# Patient Record
Sex: Female | Born: 1955 | Race: White | Hispanic: No | Marital: Married | Smoking: Former smoker
Health system: Southern US, Community
[De-identification: ages and names within clinical notes are randomized; demographics above are authoritative.]

## PROBLEM LIST (undated history)

## (undated) DIAGNOSIS — C801 Malignant (primary) neoplasm, unspecified: Secondary | ICD-10-CM

## (undated) DIAGNOSIS — M1990 Unspecified osteoarthritis, unspecified site: Secondary | ICD-10-CM

## (undated) DIAGNOSIS — E611 Iron deficiency: Secondary | ICD-10-CM

## (undated) DIAGNOSIS — Z87442 Personal history of urinary calculi: Secondary | ICD-10-CM

## (undated) DIAGNOSIS — F419 Anxiety disorder, unspecified: Secondary | ICD-10-CM

## (undated) DIAGNOSIS — I10 Essential (primary) hypertension: Secondary | ICD-10-CM

## (undated) DIAGNOSIS — K8050 Calculus of bile duct without cholangitis or cholecystitis without obstruction: Secondary | ICD-10-CM

## (undated) DIAGNOSIS — E0500 Thyrotoxicosis with diffuse goiter without thyrotoxic crisis or storm: Secondary | ICD-10-CM

## (undated) DIAGNOSIS — N200 Calculus of kidney: Secondary | ICD-10-CM

## (undated) DIAGNOSIS — D649 Anemia, unspecified: Secondary | ICD-10-CM

## (undated) DIAGNOSIS — E039 Hypothyroidism, unspecified: Secondary | ICD-10-CM

## (undated) DIAGNOSIS — I890 Lymphedema, not elsewhere classified: Secondary | ICD-10-CM

## (undated) HISTORY — DX: Hypothyroidism, unspecified: E03.9

## (undated) HISTORY — PX: COLONOSCOPY: SHX174

## (undated) HISTORY — PX: APPENDECTOMY: SHX54

## (undated) HISTORY — PX: CHOLECYSTECTOMY: SHX55

## (undated) HISTORY — DX: Anxiety disorder, unspecified: F41.9

## (undated) HISTORY — DX: Lymphedema, not elsewhere classified: I89.0

## (undated) HISTORY — DX: Iron deficiency: E61.1

## (undated) HISTORY — PX: BACK SURGERY: SHX140

## (undated) HISTORY — DX: Calculus of kidney: N20.0

## (undated) HISTORY — DX: Calculus of bile duct without cholangitis or cholecystitis without obstruction: K80.50

## (undated) HISTORY — DX: Thyrotoxicosis with diffuse goiter without thyrotoxic crisis or storm: E05.00

---

## 1999-01-14 ENCOUNTER — Other Ambulatory Visit: Admission: RE | Admit: 1999-01-14 | Discharge: 1999-01-14 | Payer: Self-pay | Admitting: *Deleted

## 1999-06-28 ENCOUNTER — Encounter (INDEPENDENT_AMBULATORY_CARE_PROVIDER_SITE_OTHER): Payer: Self-pay

## 1999-06-28 ENCOUNTER — Inpatient Hospital Stay (HOSPITAL_COMMUNITY): Admission: EM | Admit: 1999-06-28 | Discharge: 1999-06-29 | Payer: Self-pay | Admitting: Emergency Medicine

## 2000-02-18 ENCOUNTER — Encounter: Payer: Self-pay | Admitting: Neurosurgery

## 2000-02-21 ENCOUNTER — Ambulatory Visit (HOSPITAL_COMMUNITY): Admission: RE | Admit: 2000-02-21 | Discharge: 2000-02-21 | Payer: Self-pay | Admitting: Neurosurgery

## 2000-02-21 ENCOUNTER — Encounter: Payer: Self-pay | Admitting: Neurosurgery

## 2000-04-03 ENCOUNTER — Encounter: Payer: Self-pay | Admitting: Neurosurgery

## 2000-04-03 ENCOUNTER — Ambulatory Visit (HOSPITAL_COMMUNITY): Admission: RE | Admit: 2000-04-03 | Discharge: 2000-04-03 | Payer: Self-pay | Admitting: Neurosurgery

## 2000-04-12 ENCOUNTER — Ambulatory Visit (HOSPITAL_COMMUNITY): Admission: RE | Admit: 2000-04-12 | Discharge: 2000-04-12 | Payer: Self-pay | Admitting: Neurosurgery

## 2000-04-12 ENCOUNTER — Encounter: Payer: Self-pay | Admitting: Neurosurgery

## 2000-05-04 ENCOUNTER — Encounter: Payer: Self-pay | Admitting: Neurosurgery

## 2000-05-04 ENCOUNTER — Ambulatory Visit (HOSPITAL_COMMUNITY): Admission: RE | Admit: 2000-05-04 | Discharge: 2000-05-04 | Payer: Self-pay | Admitting: Neurosurgery

## 2000-05-18 ENCOUNTER — Encounter: Payer: Self-pay | Admitting: Neurosurgery

## 2000-05-18 ENCOUNTER — Ambulatory Visit (HOSPITAL_COMMUNITY): Admission: RE | Admit: 2000-05-18 | Discharge: 2000-05-18 | Payer: Self-pay | Admitting: Neurosurgery

## 2000-06-01 ENCOUNTER — Encounter: Payer: Self-pay | Admitting: Neurosurgery

## 2000-06-01 ENCOUNTER — Other Ambulatory Visit: Admission: RE | Admit: 2000-06-01 | Discharge: 2000-06-01 | Payer: Self-pay | Admitting: *Deleted

## 2000-06-01 ENCOUNTER — Ambulatory Visit (HOSPITAL_COMMUNITY): Admission: RE | Admit: 2000-06-01 | Discharge: 2000-06-01 | Payer: Self-pay | Admitting: Neurosurgery

## 2000-06-01 ENCOUNTER — Encounter (INDEPENDENT_AMBULATORY_CARE_PROVIDER_SITE_OTHER): Payer: Self-pay

## 2001-07-04 ENCOUNTER — Other Ambulatory Visit: Admission: RE | Admit: 2001-07-04 | Discharge: 2001-07-04 | Payer: Self-pay | Admitting: *Deleted

## 2001-09-18 ENCOUNTER — Encounter: Payer: Self-pay | Admitting: Internal Medicine

## 2001-09-18 ENCOUNTER — Ambulatory Visit (HOSPITAL_COMMUNITY): Admission: RE | Admit: 2001-09-18 | Discharge: 2001-09-18 | Payer: Self-pay | Admitting: Internal Medicine

## 2001-10-01 ENCOUNTER — Ambulatory Visit (HOSPITAL_COMMUNITY): Admission: RE | Admit: 2001-10-01 | Discharge: 2001-10-01 | Payer: Self-pay | Admitting: Neurosurgery

## 2001-10-01 ENCOUNTER — Encounter: Payer: Self-pay | Admitting: Neurosurgery

## 2001-10-16 ENCOUNTER — Encounter: Payer: Self-pay | Admitting: Neurosurgery

## 2001-10-18 ENCOUNTER — Ambulatory Visit (HOSPITAL_COMMUNITY): Admission: RE | Admit: 2001-10-18 | Discharge: 2001-10-18 | Payer: Self-pay | Admitting: Neurosurgery

## 2001-10-18 ENCOUNTER — Encounter: Payer: Self-pay | Admitting: Neurosurgery

## 2001-11-28 ENCOUNTER — Encounter: Admission: RE | Admit: 2001-11-28 | Discharge: 2001-11-28 | Payer: Self-pay | Admitting: Neurosurgery

## 2001-11-28 ENCOUNTER — Encounter: Payer: Self-pay | Admitting: Neurosurgery

## 2002-01-01 ENCOUNTER — Encounter: Payer: Self-pay | Admitting: Neurosurgery

## 2002-01-01 ENCOUNTER — Encounter: Admission: RE | Admit: 2002-01-01 | Discharge: 2002-01-01 | Payer: Self-pay | Admitting: Neurosurgery

## 2002-03-01 ENCOUNTER — Encounter: Payer: Self-pay | Admitting: Neurosurgery

## 2002-03-01 ENCOUNTER — Encounter: Admission: RE | Admit: 2002-03-01 | Discharge: 2002-03-01 | Payer: Self-pay | Admitting: Neurosurgery

## 2002-05-20 ENCOUNTER — Encounter: Admission: RE | Admit: 2002-05-20 | Discharge: 2002-05-20 | Payer: Self-pay | Admitting: Neurosurgery

## 2002-05-20 ENCOUNTER — Encounter: Payer: Self-pay | Admitting: Neurosurgery

## 2002-06-20 ENCOUNTER — Other Ambulatory Visit: Admission: RE | Admit: 2002-06-20 | Discharge: 2002-06-20 | Payer: Self-pay | Admitting: Internal Medicine

## 2003-04-17 ENCOUNTER — Encounter: Admission: RE | Admit: 2003-04-17 | Discharge: 2003-04-17 | Payer: Self-pay | Admitting: Neurosurgery

## 2003-04-17 ENCOUNTER — Encounter: Payer: Self-pay | Admitting: Neurosurgery

## 2003-04-24 ENCOUNTER — Encounter: Payer: Self-pay | Admitting: Neurosurgery

## 2003-04-24 ENCOUNTER — Ambulatory Visit (HOSPITAL_COMMUNITY): Admission: RE | Admit: 2003-04-24 | Discharge: 2003-04-25 | Payer: Self-pay | Admitting: Neurosurgery

## 2003-06-23 ENCOUNTER — Other Ambulatory Visit: Admission: RE | Admit: 2003-06-23 | Discharge: 2003-06-23 | Payer: Self-pay | Admitting: Obstetrics and Gynecology

## 2004-04-07 ENCOUNTER — Inpatient Hospital Stay (HOSPITAL_COMMUNITY): Admission: RE | Admit: 2004-04-07 | Discharge: 2004-04-09 | Payer: Self-pay | Admitting: Neurosurgery

## 2004-05-25 ENCOUNTER — Encounter: Admission: RE | Admit: 2004-05-25 | Discharge: 2004-05-25 | Payer: Self-pay | Admitting: Neurosurgery

## 2004-06-24 ENCOUNTER — Encounter: Admission: RE | Admit: 2004-06-24 | Discharge: 2004-06-24 | Payer: Self-pay | Admitting: Neurosurgery

## 2004-06-24 ENCOUNTER — Other Ambulatory Visit: Admission: RE | Admit: 2004-06-24 | Discharge: 2004-06-24 | Payer: Self-pay | Admitting: *Deleted

## 2004-08-25 ENCOUNTER — Encounter (HOSPITAL_COMMUNITY): Admission: RE | Admit: 2004-08-25 | Discharge: 2004-11-23 | Payer: Self-pay | Admitting: Internal Medicine

## 2004-11-04 ENCOUNTER — Encounter: Admission: RE | Admit: 2004-11-04 | Discharge: 2004-11-04 | Payer: Self-pay | Admitting: Neurosurgery

## 2005-01-13 ENCOUNTER — Encounter: Admission: RE | Admit: 2005-01-13 | Discharge: 2005-01-13 | Payer: Self-pay | Admitting: Neurosurgery

## 2005-01-14 ENCOUNTER — Encounter: Admission: RE | Admit: 2005-01-14 | Discharge: 2005-01-14 | Payer: Self-pay | Admitting: Neurosurgery

## 2005-06-27 ENCOUNTER — Other Ambulatory Visit: Admission: RE | Admit: 2005-06-27 | Discharge: 2005-06-27 | Payer: Self-pay | Admitting: Obstetrics and Gynecology

## 2005-07-26 ENCOUNTER — Encounter: Admission: RE | Admit: 2005-07-26 | Discharge: 2005-07-26 | Payer: Self-pay | Admitting: Neurosurgery

## 2005-10-20 ENCOUNTER — Observation Stay (HOSPITAL_COMMUNITY): Admission: RE | Admit: 2005-10-20 | Discharge: 2005-10-21 | Payer: Self-pay | Admitting: Neurosurgery

## 2006-11-02 ENCOUNTER — Emergency Department (HOSPITAL_COMMUNITY): Admission: EM | Admit: 2006-11-02 | Discharge: 2006-11-02 | Payer: Self-pay | Admitting: Emergency Medicine

## 2008-02-12 ENCOUNTER — Ambulatory Visit: Payer: Self-pay | Admitting: Gastroenterology

## 2008-02-18 ENCOUNTER — Ambulatory Visit: Payer: Self-pay | Admitting: Gastroenterology

## 2008-02-21 ENCOUNTER — Ambulatory Visit: Payer: Self-pay | Admitting: Cardiology

## 2008-02-25 ENCOUNTER — Ambulatory Visit: Payer: Self-pay | Admitting: Gastroenterology

## 2008-02-25 LAB — CONVERTED CEMR LAB
ALT: 14 units/L (ref 0–35)
Albumin: 3.8 g/dL (ref 3.5–5.2)
Basophils Relative: 2.8 % (ref 0.0–3.0)
Bilirubin, Direct: 0.2 mg/dL (ref 0.0–0.3)
Eosinophils Absolute: 0.2 10*3/uL (ref 0.0–0.7)
Eosinophils Relative: 3.4 % (ref 0.0–5.0)
HCT: 39.8 % (ref 36.0–46.0)
Hemoglobin: 13.6 g/dL (ref 12.0–15.0)
INR: 1.1 — ABNORMAL HIGH (ref 0.8–1.0)
MCV: 83 fL (ref 78.0–100.0)
Monocytes Absolute: 0.3 10*3/uL (ref 0.1–1.0)
Monocytes Relative: 5.7 % (ref 3.0–12.0)
Neutro Abs: 3.6 10*3/uL (ref 1.4–7.7)
Platelets: 215 10*3/uL (ref 150–400)
Total Protein: 6.8 g/dL (ref 6.0–8.3)
WBC: 5.7 10*3/uL (ref 4.5–10.5)
aPTT: 31.3 s — ABNORMAL HIGH (ref 21.7–29.8)

## 2008-02-27 ENCOUNTER — Telehealth: Payer: Self-pay | Admitting: Gastroenterology

## 2008-02-29 ENCOUNTER — Encounter: Payer: Self-pay | Admitting: Gastroenterology

## 2008-03-13 ENCOUNTER — Ambulatory Visit (HOSPITAL_COMMUNITY): Admission: RE | Admit: 2008-03-13 | Discharge: 2008-03-13 | Payer: Self-pay | Admitting: Gastroenterology

## 2008-03-13 ENCOUNTER — Encounter: Payer: Self-pay | Admitting: Gastroenterology

## 2008-03-14 ENCOUNTER — Encounter: Payer: Self-pay | Admitting: Gastroenterology

## 2008-03-14 ENCOUNTER — Telehealth: Payer: Self-pay | Admitting: Gastroenterology

## 2008-03-19 ENCOUNTER — Ambulatory Visit: Payer: Self-pay | Admitting: Gastroenterology

## 2008-04-01 ENCOUNTER — Ambulatory Visit: Payer: Self-pay | Admitting: Gastroenterology

## 2008-04-30 ENCOUNTER — Ambulatory Visit: Payer: Self-pay | Admitting: Gastroenterology

## 2008-12-05 ENCOUNTER — Encounter
Admission: RE | Admit: 2008-12-05 | Discharge: 2008-12-05 | Payer: Self-pay | Admitting: Physical Medicine and Rehabilitation

## 2009-02-06 ENCOUNTER — Inpatient Hospital Stay (HOSPITAL_COMMUNITY): Admission: RE | Admit: 2009-02-06 | Discharge: 2009-02-10 | Payer: Self-pay | Admitting: Neurosurgery

## 2009-03-12 ENCOUNTER — Encounter: Admission: RE | Admit: 2009-03-12 | Discharge: 2009-03-12 | Payer: Self-pay | Admitting: Neurosurgery

## 2009-03-29 ENCOUNTER — Encounter (INDEPENDENT_AMBULATORY_CARE_PROVIDER_SITE_OTHER): Payer: Self-pay | Admitting: *Deleted

## 2009-03-29 ENCOUNTER — Ambulatory Visit: Payer: Self-pay | Admitting: Internal Medicine

## 2009-03-29 ENCOUNTER — Inpatient Hospital Stay (HOSPITAL_COMMUNITY): Admission: EM | Admit: 2009-03-29 | Discharge: 2009-04-04 | Payer: Self-pay | Admitting: Emergency Medicine

## 2009-03-30 ENCOUNTER — Encounter: Payer: Self-pay | Admitting: Internal Medicine

## 2009-03-31 ENCOUNTER — Encounter (INDEPENDENT_AMBULATORY_CARE_PROVIDER_SITE_OTHER): Payer: Self-pay | Admitting: General Surgery

## 2009-04-01 ENCOUNTER — Encounter: Payer: Self-pay | Admitting: Internal Medicine

## 2009-04-22 ENCOUNTER — Encounter: Payer: Self-pay | Admitting: Gastroenterology

## 2009-04-23 ENCOUNTER — Encounter: Admission: RE | Admit: 2009-04-23 | Discharge: 2009-04-23 | Payer: Self-pay | Admitting: Neurosurgery

## 2009-05-05 ENCOUNTER — Ambulatory Visit: Payer: Self-pay | Admitting: Gastroenterology

## 2009-05-12 ENCOUNTER — Telehealth: Payer: Self-pay | Admitting: Gastroenterology

## 2009-05-14 ENCOUNTER — Telehealth: Payer: Self-pay | Admitting: Gastroenterology

## 2009-05-14 ENCOUNTER — Ambulatory Visit: Payer: Self-pay | Admitting: Gastroenterology

## 2009-05-14 ENCOUNTER — Ambulatory Visit (HOSPITAL_COMMUNITY): Admission: RE | Admit: 2009-05-14 | Discharge: 2009-05-14 | Payer: Self-pay | Admitting: Gastroenterology

## 2009-05-18 ENCOUNTER — Encounter: Payer: Self-pay | Admitting: Gastroenterology

## 2009-05-27 ENCOUNTER — Ambulatory Visit: Payer: Self-pay | Admitting: Gastroenterology

## 2009-06-04 ENCOUNTER — Emergency Department (HOSPITAL_COMMUNITY): Admission: EM | Admit: 2009-06-04 | Discharge: 2009-06-05 | Payer: Self-pay | Admitting: Emergency Medicine

## 2009-06-18 ENCOUNTER — Encounter: Admission: RE | Admit: 2009-06-18 | Discharge: 2009-06-18 | Payer: Self-pay | Admitting: Neurosurgery

## 2009-06-22 ENCOUNTER — Encounter: Admission: RE | Admit: 2009-06-22 | Discharge: 2009-06-22 | Payer: Self-pay | Admitting: Neurosurgery

## 2009-07-06 ENCOUNTER — Ambulatory Visit (HOSPITAL_COMMUNITY): Admission: RE | Admit: 2009-07-06 | Discharge: 2009-07-07 | Payer: Self-pay | Admitting: Neurosurgery

## 2009-08-11 ENCOUNTER — Encounter: Admission: RE | Admit: 2009-08-11 | Discharge: 2009-08-11 | Payer: Self-pay | Admitting: Neurosurgery

## 2009-09-08 ENCOUNTER — Emergency Department (HOSPITAL_COMMUNITY): Admission: EM | Admit: 2009-09-08 | Discharge: 2009-09-08 | Payer: Self-pay | Admitting: Emergency Medicine

## 2009-09-09 ENCOUNTER — Telehealth: Payer: Self-pay | Admitting: Gastroenterology

## 2009-09-10 ENCOUNTER — Ambulatory Visit: Payer: Self-pay | Admitting: Gastroenterology

## 2009-09-10 DIAGNOSIS — D509 Iron deficiency anemia, unspecified: Secondary | ICD-10-CM | POA: Insufficient documentation

## 2009-09-11 ENCOUNTER — Ambulatory Visit: Payer: Self-pay | Admitting: Gastroenterology

## 2009-09-14 LAB — CONVERTED CEMR LAB: Fecal Occult Bld: NEGATIVE

## 2009-09-15 ENCOUNTER — Encounter: Admission: RE | Admit: 2009-09-15 | Discharge: 2009-09-15 | Payer: Self-pay | Admitting: Neurosurgery

## 2009-10-16 ENCOUNTER — Ambulatory Visit: Payer: Self-pay | Admitting: Gastroenterology

## 2009-10-16 LAB — CONVERTED CEMR LAB
Basophils Absolute: 0.1 10*3/uL (ref 0.0–0.1)
Eosinophils Relative: 3.9 % (ref 0.0–5.0)
HCT: 36 % (ref 36.0–46.0)
Lymphs Abs: 2.1 10*3/uL (ref 0.7–4.0)
Monocytes Relative: 5.9 % (ref 3.0–12.0)
Neutrophils Relative %: 57.1 % (ref 43.0–77.0)
Platelets: 347 10*3/uL (ref 150.0–400.0)
RDW: 17.7 % — ABNORMAL HIGH (ref 11.5–14.6)
WBC: 6.7 10*3/uL (ref 4.5–10.5)

## 2009-10-27 ENCOUNTER — Encounter: Admission: RE | Admit: 2009-10-27 | Discharge: 2009-10-27 | Payer: Self-pay | Admitting: Neurological Surgery

## 2009-11-27 ENCOUNTER — Encounter: Admission: RE | Admit: 2009-11-27 | Discharge: 2009-11-27 | Payer: Self-pay | Admitting: Internal Medicine

## 2010-01-01 ENCOUNTER — Encounter: Admission: RE | Admit: 2010-01-01 | Discharge: 2010-01-01 | Payer: Self-pay | Admitting: Neurosurgery

## 2010-02-10 ENCOUNTER — Encounter: Admission: RE | Admit: 2010-02-10 | Discharge: 2010-02-10 | Payer: Self-pay | Admitting: Internal Medicine

## 2010-03-29 ENCOUNTER — Encounter: Admission: RE | Admit: 2010-03-29 | Discharge: 2010-03-29 | Payer: Self-pay | Admitting: Neurosurgery

## 2010-08-30 ENCOUNTER — Encounter: Payer: Self-pay | Admitting: Neurology

## 2010-08-30 ENCOUNTER — Encounter: Payer: Self-pay | Admitting: Internal Medicine

## 2010-09-07 NOTE — Procedures (Signed)
Summary: Upper Endoscopy  Patient: [name] Note: All result statuses are Final unless otherwise noted.  Tests: (1) Upper Endoscopy (EGD)   EGD Upper Endoscopy       DONE     [facility]     [address]     [place], [place]  [address]           ENDOSCOPY PROCEDURE REPORT           PATIENT:  Katie Gaines, Katie Gaines  MR#:  [id number]     BIRTHDATE:  07-20-1956, 53 yrs. old  GENDER:  female           ENDOSCOPIST:  Barbette Hair. Arlyce Dice, MD     Referred by:  Marisue Brooklyn, D.O.           PROCEDURE DATE:  09/11/2009     PROCEDURE:  EGD, diagnostic     ASA CLASS:  Class II     INDICATIONS:  iron deficiency anemia           MEDICATIONS:   Fentanyl 75 mcg IV, Versed 8 mg IV, Benadryl 25 mg     IV, glycopyrrolate (Robinal) 0.2 mg IV, 0.6cc simethancone 0.6 cc     PO     TOPICAL ANESTHETIC:  Exactacain Spray           DESCRIPTION OF PROCEDURE:   After the risks benefits and     alternatives of the procedure were thoroughly explained, informed     consent was obtained.  The LB GIF-H180 [id number] endoscope was     introduced through the mouth and advanced to the third portion of     the duodenum, without limitations.  The instrument was slowly     withdrawn as the mucosa was fully examined.     <<PROCEDUREIMAGES>>           The upper, middle, and distal third of the esophagus were     carefully inspected and no abnormalities were noted. The z-line     was well seen at the GEJ. The endoscope was pushed into the fundus     which was normal including a retroflexed view. The antrum,gastric     body, first and second part of the duodenum were unremarkable (see     image1, image2, image3, image5, image7, and image8).     Retroflexed views revealed no abnormalities.    The scope was then     withdrawn from the patient and the procedure completed.           COMPLICATIONS:  None           ENDOSCOPIC IMPRESSION:     1) Normal EGD     RECOMMENDATIONS:     1) hemmoccult stools        REPEAT EXAM:  No           ______________________________     Barbette Hair. Arlyce Dice, MD           CC:           n.     eSIGNED:   Barbette Hair. Alaena Strader at 09/11/2009 11:47 AM           Lorel Monaco, [id number]  Note: An exclamation mark (!) indicates a result that was not dispersed into the flowsheet. Document Creation Date: 09/11/2009 11:47 AM _______________________________________________________________________  (1) Order result status: Final Collection or observation date-time: 09/11/2009 11:43 Requested date-time:  Receipt date-time:  Reported date-time:  Referring Physician:   Ordering Physician:  [name] [phone]) Specimen Source:  Source: Launa Grill Order Number: [id number] Lab site:

## 2010-09-07 NOTE — Letter (Signed)
Summary: EGD Instructions  [facility]  [address] [place], [place] [address]   Phone: [phone]  Fax: [phone]       Katie Gaines    1956/04/17    MRN: [id number]       Procedure Day /Date:FRIDAY 09/11/2009     Arrival Time: 9:30AM     Procedure Time:10:30AM     Location of Procedure:                    X  [facility] (4th Floor)    PREPARATION FOR ENDOSCOPY   On 09/11/2009  THE DAY OF THE PROCEDURE:  1.   No solid foods, milk or milk products are allowed after midnight the night before your procedure.  2.   Do not drink anything colored red or purple.  Avoid juices with pulp.  No orange juice.  3.  You may drink clear liquids until 8:30AM , which is 2 hours before your procedure.                                                                                                CLEAR LIQUIDS INCLUDE: Water Jello Ice Popsicles Tea (sugar ok, no milk/cream) Powdered fruit flavored drinks Coffee (sugar ok, no milk/cream) Gatorade Juice: apple, white grape, white cranberry  Lemonade Clear bullion, consomm, broth Carbonated beverages (any kind) Strained chicken noodle soup Hard Candy   MEDICATION INSTRUCTIONS  Unless otherwise instructed, you should take regular prescription medications with a small sip of water as early as possible the morning of your procedure.            OTHER INSTRUCTIONS  You will need a responsible adult at least 55 years of age to accompany you and drive you home.   This person must remain in the waiting room during your procedure.  Wear loose fitting clothing that is easily removed.  Leave jewelry and other valuables at home.  However, you may wish to bring a book to read or an iPod/MP3 player to listen to music as you wait for your procedure to start.  Remove all body piercing jewelry and leave at home.  Total time from sign-in until discharge is approximately 2-3 hours.  You should go home directly  after your procedure and rest.  You can resume normal activities the day after your procedure.  The day of your procedure you should not:   Drive   Make legal decisions   Operate machinery   Drink alcohol   Return to work  You will receive specific instructions about eating, activities and medications before you leave.    The above instructions have been reviewed and explained to me by   _______________________    I fully understand and can verbalize these instructions _____________________________ Date _________

## 2010-09-07 NOTE — Letter (Signed)
Summary: [facility]: Immunoassay Fecal Occult Blood (iFOB) Order [facility]  [address] [place], [place] [address]   Phone: [phone]  Fax: [phone]      [facility]: Immunoassay Fecal Occult Blood (iFOB) Order Form   September 10, 2009 MRN: [id number]   Katie Gaines Jun 23, 1956   Physicican Name:_ROBERT Kahli Fitzgerald  Diagnosis Code:280.9 ANEMIA    [name] CMA (AAMA)

## 2010-09-07 NOTE — Assessment & Plan Note (Signed)
Summary: ANEMIA                 Katie Gaines   History of Present Illness Visit Type: Follow-up Visit Primary GI MD: [name] MD [facility] Primary Provider: Marisue Brooklyn, DO Requesting Provider: Marisue Brooklyn, MD Chief Complaint: Anemia issues History of Present Illness:   [name] has returned for evaluation of anemia.  This was noted on routine testing.  On September 04, 2009 hemoglobin was 11.4.  Serum iron was 21 and TIBC 650.  Percent saturation was 3.  The patient has no GI complaints including change in bowel habits, pain, melena or hematochezia.  She takes Excedrin migraine tabs every other week for headaches.  She has no history of anemia.  In July, 2009 hemoglobin was 13.6.  She has not had a menstrual period for several years.  The patient has a history of choledocholithiasis.  She underwent colonoscopy in July, 2009 because of stool incontinence.  No abnormalities were seen.   GI Review of Systems      Denies abdominal pain, acid reflux, belching, bloating, chest pain, dysphagia with liquids, dysphagia with solids, heartburn, loss of appetite, nausea, vomiting, vomiting blood, weight loss, and  weight gain.        Denies anal fissure, black tarry stools, change in bowel habit, constipation, diarrhea, diverticulosis, fecal incontinence, heme positive stool, hemorrhoids, irritable bowel syndrome, jaundice, light color stool, liver problems, rectal bleeding, and  rectal pain.    Current Medications (verified): 1)  Synthroid 175 Mcg  Tabs (Levothyroxine Sodium) .... Take 1 Tablet By Mouth As Directed 2)  Synthroid 150 Mcg  Tabs (Levothyroxine Sodium) .... Take 1 Tablet By Mouth Once Every Other Day As Directed 3)  Excedrin Migraine 250-250-65 Mg  Tabs (Aspirin-Acetaminophen-Caffeine) .... As Needed 4)  Vicks Nyquil Multi-Symptom 15-6.25-325 Mg  Caps (Dm-Doxylamine-Acetaminophen) .... As Needed 5)  Diazepam 5 Mg Tabs (Diazepam) .... Take 1 Tablet By Mouth Every 8 Hours As Needed 6)   Gabapentin 300 Mg Caps (Gabapentin) .... Take 1-2 Capsules At Bedtime 7)  Hydrocodone-Acetaminophen 5-500 Mg Tabs (Hydrocodone-Acetaminophen) .... Take 2 Tablets Every Four Hour As Needed For Pain 8)  Savella 50 Mg Tabs (Milnacipran Hcl) .... Two Times A Day 9)  Benicar 40 Mg Tabs (Olmesartan Medoxomil) .... At Bedtime  Allergies (verified): No Known Drug Allergies  Past History:  Past Medical History: Reviewed history from 02/12/2008 and no changes required. Anxiety Disorder Hypothyroidism Kidney Stones  Past Surgical History: Back Surgery x 8 1987-2007 Cholecystectomy neck surgery dec 2010   Family History: Reviewed history from 05/05/2009 and no changes required. Family History of Colon Polyps: Father Family History of Diabetes:  Father No FH of Colon Cancer:  Social History: Reviewed history from 02/12/2008 and no changes required. Divorced, Retired Patient currently smokes. Using Chantix to quit Alcohol Use - no Daily Caffeine Use 1 soda daily  Review of Systems       The patient complains of anemia, anxiety-new, arthritis/joint pain, back pain, depression-new, fatigue, headaches-new, itching, night sweats, sleeping problems, and sore throat.    Vital Signs:  Patient profile:   55 year old female Height:      66 inches Weight:      187.38 pounds BMI:     30.35 Pulse rate:   100 / minute Pulse rhythm:   regular BP sitting:   152 / 102  (left arm) Cuff size:   regular  Vitals Entered By: June McMurray CMA Duncan Dull) (September 10, 2009 11:33 AM)  Physical Exam  Additional Exam:  She is a healthy-appearing female  skin: anicteric HEENT: normocephalic; PEERLA; no nasal or pharyngeal abnormalities neck: supple nodes: no cervical lymphadenopathy chest: clear to ausculatation and percussion heart: no murmurs, gallops, or rubs abd: soft, nontender; BS normoactive; no abdominal masses, tenderness, organomegaly rectal: deferred ext: no cynanosis, clubbing,  edema skeletal: no deformities neuro: oriented x 3; no focal abnormalities    Impression & Recommendations:  Problem # 1:  IRON DEFICIENCY (ICD-280.9) Assessment New  Anemia maybe related to intermittent NSAID use.  A colonic source is unlikely in the face of a relatively recent colonoscopy.  Active peptic disease should be ruled out.  Recommendations #1 upper endoscopy #2 serial Hemoccults  Risks, alternatives, and complications of the procedure, including bleeding, perforation, and possible need for surgery, were explained to the patient.  Patient's questions were answered.  Orders: EGD (EGD)  Patient Instructions: 1)  Conscious Sedation brochure given.  2)  Upper Endoscopy brochure given.  3)  Your EGD is scheduled for 09/11/2009 at 10:30am 4)  cc Amy Elisabeth Most, DO 5)  The medication list was reviewed and reconciled.  All changed / newly prescribed medications were explained.  A complete medication list was provided to the patient / caregiver.

## 2010-09-07 NOTE — Progress Notes (Signed)
Summary: Anemia  ---- Converted from flag ---- ---- 09/09/2009 12:16 PM, Louis Meckel MD wrote: Needs OV for anemia ------------------------------  Phone Note Outgoing Call   Call placed by: Laureen Ochs LPN,  September 09, 2009 12:32 PM Call placed to: Patient Summary of Call: Pt. will see Dr.Sevon Rotert on 09-10-09 at 11:30am.  Initial call taken by: Laureen Ochs LPN,  September 09, 2009 12:35 PM

## 2010-10-27 LAB — COMPREHENSIVE METABOLIC PANEL
AST: 28 U/L (ref 0–37)
Albumin: 3.6 g/dL (ref 3.5–5.2)
Alkaline Phosphatase: 115 U/L (ref 39–117)
BUN: 12 mg/dL (ref 6–23)
CO2: 27 mEq/L (ref 19–32)
Chloride: 105 mEq/L (ref 96–112)
GFR calc Af Amer: >60 mL/min (ref >60)
GFR calc non Af Amer: >60 mL/min (ref >60)
Potassium: 4 mEq/L (ref 3.5–5.1)
Total Bilirubin: 0.3 mg/dL (ref 0.3–1.2)

## 2010-10-27 LAB — DIFFERENTIAL
Basophils Absolute: 0.1 10*3/uL (ref 0.0–0.1)
Basophils Relative: 1 % (ref 0–1)
Eosinophils Relative: 3 % (ref 0–5)
Monocytes Absolute: 0.4 10*3/uL (ref 0.1–1.0)

## 2010-10-27 LAB — CBC
HCT: 35.9 % — ABNORMAL LOW (ref 36.0–46.0)
MCV: 81.1 fL (ref 78.0–100.0)
Platelets: 257 10*3/uL (ref 150–400)
RBC: 4.43 MIL/uL (ref 3.87–5.11)
WBC: 6.2 10*3/uL (ref 4.0–10.5)

## 2010-10-27 LAB — LIPASE, BLOOD: Lipase: 11 U/L (ref 11–59)

## 2010-11-10 LAB — COMPREHENSIVE METABOLIC PANEL
ALT: 13 U/L (ref 0–35)
Alkaline Phosphatase: 116 U/L (ref 39–117)
Chloride: 106 mEq/L (ref 96–112)
Glucose, Bld: 96 mg/dL (ref 70–99)
Potassium: 4.1 mEq/L (ref 3.5–5.1)
Sodium: 138 mEq/L (ref 135–145)
Total Bilirubin: 0.4 mg/dL (ref 0.3–1.2)
Total Protein: 6.5 g/dL (ref 6.0–8.3)

## 2010-11-10 LAB — CBC
HCT: 34.1 % — ABNORMAL LOW (ref 36.0–46.0)
Hemoglobin: 11.5 g/dL — ABNORMAL LOW (ref 12.0–15.0)
RBC: 4.42 MIL/uL (ref 3.87–5.11)
RDW: 16.7 % — ABNORMAL HIGH (ref 11.5–15.5)
WBC: 5.1 10*3/uL (ref 4.0–10.5)

## 2010-11-10 LAB — PROTIME-INR: Prothrombin Time: 13.1 seconds (ref 11.6–15.2)

## 2010-11-11 LAB — HEMOGLOBIN AND HEMATOCRIT, BLOOD
HCT: 31.8 % — ABNORMAL LOW (ref 36.0–46.0)
Hemoglobin: 10.5 g/dL — ABNORMAL LOW (ref 12.0–15.0)

## 2010-11-13 LAB — COMPREHENSIVE METABOLIC PANEL
ALT: 119 U/L — ABNORMAL HIGH (ref 0–35)
ALT: 137 U/L — ABNORMAL HIGH (ref 0–35)
ALT: 83 U/L — ABNORMAL HIGH (ref 0–35)
ALT: 83 U/L — ABNORMAL HIGH (ref 0–35)
AST: 117 U/L — ABNORMAL HIGH (ref 0–37)
AST: 138 U/L — ABNORMAL HIGH (ref 0–37)
AST: 161 U/L — ABNORMAL HIGH (ref 0–37)
AST: 58 U/L — ABNORMAL HIGH (ref 0–37)
AST: 67 U/L — ABNORMAL HIGH (ref 0–37)
Albumin: 2.9 g/dL — ABNORMAL LOW (ref 3.5–5.2)
Albumin: 2.9 g/dL — ABNORMAL LOW (ref 3.5–5.2)
Albumin: 3.1 g/dL — ABNORMAL LOW (ref 3.5–5.2)
Albumin: 3.2 g/dL — ABNORMAL LOW (ref 3.5–5.2)
Alkaline Phosphatase: 371 U/L — ABNORMAL HIGH (ref 39–117)
Alkaline Phosphatase: 371 U/L — ABNORMAL HIGH (ref 39–117)
Alkaline Phosphatase: 508 U/L — ABNORMAL HIGH (ref 39–117)
BUN: 8 mg/dL (ref 6–23)
BUN: <1 mg/dL — ABNORMAL LOW (ref 6–23)
CO2: 27 mEq/L (ref 19–32)
CO2: 27 mEq/L (ref 19–32)
CO2: 27 mEq/L (ref 19–32)
CO2: 28 mEq/L (ref 19–32)
CO2: 28 mEq/L (ref 19–32)
Calcium: 8.7 mg/dL (ref 8.4–10.5)
Calcium: 8.7 mg/dL (ref 8.4–10.5)
Calcium: 8.9 mg/dL (ref 8.4–10.5)
Chloride: 100 mEq/L (ref 96–112)
Chloride: 101 mEq/L (ref 96–112)
Chloride: 101 mEq/L (ref 96–112)
Chloride: 104 mEq/L (ref 96–112)
Chloride: 105 mEq/L (ref 96–112)
Creatinine, Ser: 0.53 mg/dL (ref 0.4–1.2)
Creatinine, Ser: 0.61 mg/dL (ref 0.4–1.2)
GFR calc Af Amer: >60 mL/min (ref >60)
GFR calc Af Amer: >60 mL/min (ref >60)
GFR calc Af Amer: >60 mL/min (ref >60)
GFR calc Af Amer: >60 mL/min (ref >60)
GFR calc Af Amer: >60 mL/min (ref >60)
GFR calc non Af Amer: >60 mL/min (ref >60)
GFR calc non Af Amer: >60 mL/min (ref >60)
GFR calc non Af Amer: >60 mL/min (ref >60)
GFR calc non Af Amer: >60 mL/min (ref >60)
GFR calc non Af Amer: >60 mL/min (ref >60)
Glucose, Bld: 105 mg/dL — ABNORMAL HIGH (ref 70–99)
Potassium: 2.8 mEq/L — ABNORMAL LOW (ref 3.5–5.1)
Potassium: 3 mEq/L — ABNORMAL LOW (ref 3.5–5.1)
Potassium: 3.6 mEq/L (ref 3.5–5.1)
Potassium: 3.9 mEq/L (ref 3.5–5.1)
Potassium: 4.1 mEq/L (ref 3.5–5.1)
Sodium: 134 mEq/L — ABNORMAL LOW (ref 135–145)
Sodium: 137 mEq/L (ref 135–145)
Sodium: 137 mEq/L (ref 135–145)
Sodium: 140 mEq/L (ref 135–145)
Total Bilirubin: 0.5 mg/dL (ref 0.3–1.2)
Total Bilirubin: 0.6 mg/dL (ref 0.3–1.2)
Total Bilirubin: 0.7 mg/dL (ref 0.3–1.2)
Total Bilirubin: 0.8 mg/dL (ref 0.3–1.2)
Total Bilirubin: 2.1 mg/dL — ABNORMAL HIGH (ref 0.3–1.2)
Total Protein: 6.2 g/dL (ref 6.0–8.3)
Total Protein: 6.5 g/dL (ref 6.0–8.3)

## 2010-11-13 LAB — URINE MICROSCOPIC-ADD ON

## 2010-11-13 LAB — CBC
HCT: 39.8 % (ref 36.0–46.0)
Hemoglobin: 13.4 g/dL (ref 12.0–15.0)
MCHC: 33.5 g/dL (ref 30.0–36.0)
MCHC: 33.6 g/dL (ref 30.0–36.0)
MCV: 81.1 fL (ref 78.0–100.0)
Platelets: 230 10*3/uL (ref 150–400)
RBC: 3.62 MIL/uL — ABNORMAL LOW (ref 3.87–5.11)
RBC: 4.09 MIL/uL (ref 3.87–5.11)
RDW: 14.4 % (ref 11.5–15.5)
RDW: 14.7 % (ref 11.5–15.5)
WBC: 4.9 10*3/uL (ref 4.0–10.5)
WBC: 6.3 10*3/uL (ref 4.0–10.5)

## 2010-11-13 LAB — DIFFERENTIAL
Eosinophils Absolute: 0.1 10*3/uL (ref 0.0–0.7)
Eosinophils Relative: 1 % (ref 0–5)
Monocytes Absolute: 0.5 10*3/uL (ref 0.1–1.0)
Neutro Abs: 4.5 10*3/uL (ref 1.7–7.7)
Neutrophils Relative %: 71 % (ref 43–77)

## 2010-11-13 LAB — GLUCOSE, CAPILLARY
Glucose-Capillary: 103 mg/dL — ABNORMAL HIGH (ref 70–99)
Glucose-Capillary: 105 mg/dL — ABNORMAL HIGH (ref 70–99)
Glucose-Capillary: 105 mg/dL — ABNORMAL HIGH (ref 70–99)
Glucose-Capillary: 107 mg/dL — ABNORMAL HIGH (ref 70–99)
Glucose-Capillary: 120 mg/dL — ABNORMAL HIGH (ref 70–99)
Glucose-Capillary: 96 mg/dL (ref 70–99)
Glucose-Capillary: 97 mg/dL (ref 70–99)

## 2010-11-13 LAB — URINALYSIS, ROUTINE W REFLEX MICROSCOPIC
Glucose, UA: NEGATIVE mg/dL
Ketones, ur: >80 mg/dL — AB
Nitrite: NEGATIVE
Nitrite: NEGATIVE
Protein, ur: NEGATIVE mg/dL
Specific Gravity, Urine: 1.014 (ref 1.005–1.030)
Specific Gravity, Urine: 1.025 (ref 1.005–1.030)
Urobilinogen, UA: 0.2 mg/dL (ref 0.0–1.0)
pH: 6 (ref 5.0–8.0)

## 2010-11-13 LAB — LIPID PANEL
Cholesterol: 159 mg/dL (ref 0–200)
LDL Cholesterol: 78 mg/dL (ref 0–99)
Total CHOL/HDL Ratio: 2.5 RATIO
VLDL: 17 mg/dL (ref 0–40)

## 2010-11-13 LAB — CULTURE, BLOOD (ROUTINE X 2)
Culture: NO GROWTH
Culture: NO GROWTH

## 2010-11-13 LAB — AMYLASE: Amylase: 936 U/L — ABNORMAL HIGH (ref 27–131)

## 2010-11-13 LAB — URINE CULTURE

## 2010-11-13 LAB — POCT PREGNANCY, URINE: Preg Test, Ur: NEGATIVE

## 2010-11-13 LAB — LIPASE, BLOOD: Lipase: 108 U/L — ABNORMAL HIGH (ref 11–59)

## 2010-11-14 LAB — TYPE AND SCREEN
ABO/RH(D): O POS
Antibody Screen: NEGATIVE

## 2010-11-15 LAB — CBC
MCHC: 33.6 g/dL (ref 30.0–36.0)
MCV: 82.3 fL (ref 78.0–100.0)
Platelets: 227 10*3/uL (ref 150–400)
RBC: 4.86 MIL/uL (ref 3.87–5.11)
RDW: 14.7 % (ref 11.5–15.5)

## 2010-12-21 NOTE — Discharge Summary (Signed)
NAMEANAVICTORIA, WILK NO.:  [id number]   MEDICAL RECORD NO.:  [id number]          PATIENT TYPE:  INP   LOCATION:  3038                         FACILITY:  [facility]   PHYSICIAN:  Donalee Citrin, M.D.        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   PROCEDURE:  Reexploration and fusion at L4-S1 with removal of hardware  and decompressive stabilization procedure at L3-4.   HOSPITAL COURSE:  The patient is a 55 year old female who was admitted  to the hospital as an EMA, underwent the aforementioned operation.  Postop, the patient was recovering on the floor.  On the floor, the  patient had significant difficulty controlling pain on medications and  keeping her pain under control.  She came into the hospital on  significantly high doses of immediate and sustained-release morphine.  However, the acute pain postoperatively was refractory to all forms of  p.o. and IV pain medication.  So, over the first 48-72 hours, it was  very difficult to get this under control.  Her leg pain had improved.  Her back pain was progressively worse.  However, by the third day  postop, this started to turn the corners, started feeling progressively  better.  She was maintained on preoperative medication regimen with the  addition of oxycodone and OxyContin.  The patient was subsequently able  to be discharged home on February 10, 2009, which was postoperative day 4.  At the time of discharge, the patient was ambulating, voiding  spontaneously, tolerating a regular diet, and pain was controlled on the  pills.           ______________________________  Donalee Citrin, M.D.     GC/MEDQ  D:  03/11/2009  T:  03/12/2009  Job:  [id number]

## 2010-12-21 NOTE — Discharge Summary (Signed)
NAMEBRINA, UMEDA NO.:  [id number]   MEDICAL RECORD NO.:  [id number]          PATIENT TYPE:  INP   LOCATION:  5154                         FACILITY:  [facility]   PHYSICIAN:  Isidor Holts, M.D.  DATE OF BIRTH:  Dec 04, 1955   DATE OF ADMISSION:  03/29/2009  DATE OF DISCHARGE:                               DISCHARGE SUMMARY   PRIMARY MD:  Dr. Marisue Brooklyn.   DISCHARGE DIAGNOSES:  1. Cholelithiasis, status post laparoscopic cholecystectomy, March 31, 2009.  2. Choledocholithiasis, status post endoscopic retrograde      cholangiopancreatography, April 01, 2009, with partial removal of      CBD stone/sphincterotomy/biliary stent.  3. Hypothyroidism.  4. Chronic back pain.  5. Obesity.   DISCHARGE MEDICATIONS:  1. Dilaudid 2 mg p.o. p.r.n. q. 6 h.  2. Promethazine 25 mg p.o. p.r.n. q.6 h.  3. Synthroid 115 mcg, alternating with 75 mcg p.o. daily.   Note:  Percocet has been discontinued.   PROCEDURES:  1. Abdominal ultrasound scan, March 29, 2009.  This showed      cholelithiasis without sonographic findings of acute cholecystitis,      dilated common bile duct, measuring 10 mm; normal sonographic      appearance of liver, spleen and both kidneys.  Nonvisualization of      pancreas.  2. Chest x-ray, done March 30, 2009.  This was a stable examination      with no active cardiopulmonary process.  3. Laparoscopic cholecystectomy, March 31, 2009 performed by Dr.      Violeta Gelinas.  This was an uncomplicated cholecystectomy.      Intraoperative cholangiogram, March 31, 2009, showed distal common      duct stone, and associated ductal dilatation.  4. Endoscopic retrograde cholangiopancreatography with sphincterotomy,      April 01, 2009, performed by Dr. Stan Head.  This was followed      by stone removal and placement of biliary stent.   CONSULTATIONS:  1. Dr. Stan Head, gastroenterologist.  2. Dr. Violeta Gelinas, general surgeon.  3.  Dr. [name], gastroenterologist.   ADMISSION HISTORY:  As in H and P notes of March 29, 2009, dictated by  Dr. Midge Minium.  However, in brief, this is a 55 year old female,  with known history of hypothyroidism, lumbosacral DJD/chronic back pain,  status post previous back surgeries, history of previous neck surgeries,  obesity, presenting with 3-day history of abdominal pain, associated  with nausea and vomiting.  She reported to her primary MD.  Lab work was  done.  LFTs were found to be abnormal.  She was prescribed pain  medication, but because of persistent symptomatology, she presented to  the emergency department, where she was found to have markedly abnormal  LFTs and an ultrasound of the abdomen demonstrated cholelithiasis, as  well as dilated common bile duct.  She was admitted for further  evaluation, investigation and management.   CLINICAL COURSE:  1. Cholelithiasis:  For details of presentation, refer to admission      history above.  The patient's ultrasound scan  demonstrated      cholelithiasis.  There was also a dilated common bile duct of      approximately 10 mm, suggestive of obstruction of the common bile      duct. Gastroenterology consultation was kindly provided by Dr. Stan Head.  Surgical consultation was kindly provided by Dr. Violeta Gelinas.  The patient was commenced on intravenous fluids, bowel      rest, analgesics, covered empirically with a combination of      intravenous Ciprofloxacin and Flagyl.  She subsequently underwent      laparoscopic cholecystectomy on March 31, 2009, in an      uncomplicated procedure.  For details, refer to procedure notes of      March 31, 2009.   1. Choledocholithiasis:  As described above, imaging studies      demonstrated a dilated, i.e. 10 mm, common bile duct, suggestive of      common bile duct obstruction.  The patient underwent ERCP on April 01, 2009, in an uncomplicated procedure  performed by Dr. Stan Head.  She was found to have a very large common bile duct      stone, which however, fragmented on attempts to remove it, and      became partially retained.  Sphincterectomy was done.  Biliary      stent was placed in situ.  The patient's LFTs have improved during      the course of her hospitalization from an alkaline phosphatase of      508, AST 275, ALT 189 to alkaline phosphatase of 267, AST 33, ALT      56 on April 03, 2009.  She did experience a bump in lipase from 12      at the time of presentation, to 1244 on April 02, 2009, i.e. the      day following ERCP.  However, by April 03, 2009, this had      practically normalized at 108 and the patient felt considerably      better.   1. Dysthyroidism:  The patient continues on pre-admission Synthroid.      TSH during this hospitalization, was 0.628, i.e. euthyroid.   1. Chronic back pain:  This was managed with p.r.n. analgesic      medication.  The patient remained stable from this viewpoint.   DISPOSITION:  The patient as of April 03, 2009, was feeling  considerably better.  We were able to gradually advance her diet without  any deleterious consequences.  She had also started ambulating.  She did  have positive urinary sediment, suggestive of urinary tract infection,  with WBC 7-10, RBC 7-10, bacteria many.  It was felt that this would be  adequately covered by Ciprofloxacin.  However, subsequent urine cultures  demonstrated 85,000 colonies of multiple bacterial morphotypes,  consistent with contamination, and repeat urinalysis of April 01, 2009  was negative.  The patient has been reassured accordingly.  Provided no  acute problems arise in the interim, the patient will be discharged on  April 04, 2009.   DIET:  Low-fat for 2 weeks, and then regular.   ACTIVITY:  As tolerated.  Recommended to increase activity slowly.  May  shower.  No lifting over 15 pounds, for 4 weeks.   FOLLOW-UP  INSTRUCTIONS:  The patient is to follow up routinely with her  primary M.D.,  Dr. Marisue Brooklyn,  per prior scheduled appointment.  Follow up with Dr. Violeta Gelinas, general surgeon, in 1-2 weeks,  telephone  number [phone].  She has been instructed to call for an  appointment.  In addition, she is to follow up with Dr. [name],  telephone number [phone].  An appointment has been scheduled for  September 28 at 1:45 p.m.      Isidor Holts, M.D.  Electronically Signed     CO/MEDQ  D:  04/03/2009  T:  04/03/2009  Job:  [id number]   cc:   Lovenia Kim, D.O.  Iva Boop, MD,FACG  Barbette Hair. Arlyce Dice, MD,FACG  Gabrielle Dare Janee Morn, M.D.

## 2010-12-21 NOTE — Op Note (Signed)
NAME:  Katie Gaines, Katie Gaines                ACCOUNT NO.:  [id number]   MEDICAL RECORD NO.:  [id number]          PATIENT TYPE:  INP   LOCATION:  5154                         FACILITY:  [facility]   PHYSICIAN:  Gabrielle Dare. Janee Morn, M.D.DATE OF BIRTH:  09-15-1955   DATE OF PROCEDURE:  03/31/2009  DATE OF DISCHARGE:                               OPERATIVE REPORT   PREOPERATIVE DIAGNOSES:  1. Cholelithiasis.  2. Possible choledocholithiasis.   POSTOPERATIVE DIAGNOSES:  1. Cholelithiasis.  2. Choledocholithiasis.   PROCEDURE:  Laparoscopic cholecystectomy with intraoperative  cholangiogram.   SURGEON:  Gabrielle Dare. Janee Morn, MD   ASSISTANT:  Brayton El, PA-C   ANESTHESIA:  General endotracheal.   HISTORY OF PRESENT ILLNESS:  [name] is a 55 year old female who was  admitted to the hospital with symptomatic cholelithiasis and possible  choledocholithiasis.  She had abdominal pain.  Liver function tests were  elevated.  She does have a history of ERCP in the past approximately 1  year ago with sphincterotomy.  I discussed her in detail with Dr.  Leone Payor and we are planning on proceeding with laparoscopic  cholecystectomy with intraoperative cholangiogram and Dr. Leone Payor plans  to standby in case our cholangiogram is positive for common bile duct  stones.  The patient has a complicated history of chronic pain and  multiple back surgeries.   PROCEDURE IN DETAIL:  Informed consent was obtained.  The patient was  identified in the preop holding area.  She is currently receiving  intravenous antibiotics.  She was brought to the operating room.  General endotracheal anesthesia was administered by the anesthesia  staff.  Her abdomen was prepped and draped in sterile fashion.  Time-out  procedure was done.  Infraumbilical region was infiltrated with 0.25%  Marcaine with epinephrine, infraumbilical incision was made.  Subcutaneous tissues were dissected down revealing the anterior fascia.  This was  divided sharply and the peritoneal cavity was carefully entered  under direct vision without difficulty.  A 0 Vicryl pursestring suture  was placed around the fascial opening and a Hasson trocar was inserted  into the abdomen.  The abdomen was insufflated with carbon dioxide in  standard fashion under direct vision.  An 11-mm epigastric and two 5-mm  lateral ports were placed.  A 0.25% Marcaine with epinephrine was used  at all port sites.  The dome of the gallbladder had several filmy  omental adhesions on it, these were swept down.  The dome of the  gallbladder was retracted superomedially.  Some further adhesions that  were quite filmy, attaching the duodenum to the body of the gallbladder,  were also carefully swept down, avoiding touching the duodenum and this  continued until we visualized the infundibulum of the gallbladder.  This  was then retracted inferolaterally.  Further dissection swept away a lot  of these filmy adhesions.  Dissection began laterally and progressed  medially, and we gradually identified the cystic duct.  Dissection  continued until a large window was created between the cystic duct, the  infundibulum of the gallbladder, and the liver.  We also dissected the  cystic artery  at this time.  Once we had excellent visualization and  good critical view, a clip was placed on the infundibulocystic duct  junction, a small nick was made in the cystic duct and a Reddick  cholangiogram catheter was inserted.  Intraoperative cholangiogram was  then obtained.  This demonstrated a very dilated common bile duct , a  good length of cystic duct and a distal common bile duct obstruction  possibly due to a large stone.  The cholangiogram catheter was removed  and all of the excess contrast was milked out of the cystic duct and  there was no stone debris seen.  During the cholangiogram, we did try to  flush things through distally, but we were unsuccessful.  Once the  contrast  material was evacuated, 3 clips were placed proximally on the  cystic duct and it was divided.  The cystic artery was further  dissected, it was clipped 3 times proximally and once distally and  divided as well.  The gallbladder was taken off the liver bed, it was  quite large.  The liver bed was cauterized to get excellent hemostasis.  The gallbladder was placed in an EndoCatch bag and removed from the  abdomen via the infraumbilical port site.  The abdomen was copiously  irrigated with saline, irrigation fluid did return clear.  The liver bed  was rechecked, it was hemostatic.  Clips were all in good position.  The  residual irrigation fluid was evacuated.  The liver bed was checked one  more time and remained dry and clips remained in good position.  The  ports were then removed under direct vision.  The pneumoperitoneum was  released.  Infraumbilical fascia was closed by tying with the 0 Vicryl  pursestring suture with care not to trap any intraabdominal contents.  All 4 wounds were copiously irrigated and the skin of each was closed  with running 4-0 Vicryl subcuticular stitch followed by Dermabond.  Sponge, needle, and instrument counts were all correct.  The patient  tolerated the procedure well without apparent complication and was taken  to recovery room in stable condition.  I spoke with Dr. Charlies Constable,  who was on-call for Dr. Stan Head and they will plan ERCP with stone  extraction tomorrow.      Gabrielle Dare Janee Morn, M.D.  Electronically Signed     BET/MEDQ  D:  03/31/2009  T:  04/01/2009  Job:  [id number]   cc:   Iva Boop, MD,FACG

## 2010-12-21 NOTE — Op Note (Signed)
NAME:  Katie Gaines, Katie Gaines                ACCOUNT NO.:  [id number]   MEDICAL RECORD NO.:  [id number]          PATIENT TYPE:  INP   LOCATION:  3038                         FACILITY:  [facility]   PHYSICIAN:  Donalee Citrin, M.D.        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   PROCEDURES:  Exploration of fusion and removal of hardware L4 through  S1; decompressive laminectomy and posterior lumbar interbody fusion at a  different level L3-4 using a Telamon PEEK cage and tangent allograft  wedge hybrid with 8 x 22 mm Telamon, 8 x 26 mm tangent augmented by  local autograft packed with Actifuse, bone substitute; pedicle screw  fixation at L3-4 using a 6.35 Legacy pedicle screw system;  posterolateral arthrodesis L3-4 using local autograft mixed with  Actifuse.  There was removal of stimulator from 2 separate incisions and  removal of the stimulator power pack and battery and removal of the  leads from the posterior iliac incisions as well as the lower thoracic  incision.   SURGEON:  Donalee Citrin, MD   ASSISTANT:  Reinaldo Meeker, MD   ANESTHESIA:  General endotracheal.   HISTORY OF PRESENT ILLNESS:  The patient is a very pleasant 55 year old  female who has had previous L4 through S1 fusion many years ago, did  very well.  However in the last several weeks to months, she has  progressive worsening back and bilateral leg pain in the L3 and L4 nerve  root pattern.  Repeat imaging showed breakdown at the level above her  old fusion with significant disk degeneration, collapse, scoliotic  deformity, and foraminal stenosis.  The patient had a spinal cord  stimulator, this stop working and stopped the control of her pain as the  radiculopathy progressed and the patient requested stimulator removal  after  the patient was recommended reexploration of old fusion, removal  of hardware L4 through S1, and then a new fusion level at L3-4 and  decompression.  Risks and benefits of both procedures were explained to  the patient, she understood and agreed to proceed forth.   The patient was brought to the OR, was induced under general anesthesia.  The patient was placed prone on the Wilson frame, and her back was  prepped and draped in sterile fashion, over all incisions at the lower  thoracic, the old lower lumbar, as well as the left-sided posterior  iliac.  Initially, the stimulator was removed.  Both the thoracic  incision and the iliac incision were opened up.  The box was then  identified, removed.  Fleets were disconnected, and the lead was removed  from the previous hemilaminotomy in the thoracic area and working from  both incisions forward, the leads and the box were removed.  Both  incisions were copiously irrigated and closed with interrupted Vicryl  and running 4-0 subcuticular.  After these incisions were closed,  attention was taken to the lumbar wound.  Her old incision was opened up  and extended cephalad.  The subperiosteal dissection was carried out on  the lamina of L3, facet complex, T-piece at L3 and L4.  Then, the old  hardware was exposed from L4 through S1.  Fusion appeared to be solid,  so the cross-link was removed, nuts and rods removed, and the L5 and S1  screws removed leaving the L4 screws behind.  After confirmation that  all the fusion was out, attention was taken to the L3-4 level.  The  spinous process was then removed at L3.  Central decompression was  begun.  Aggressive medial facetectomies were performed dissecting the  scar tissue removing the facets.  There was only marked foraminal  stenosis as well as hourglass compression in thecal sac due to facet  arthropathy at this level and the facets were also diastased.  After the  L3 and L4 nerve roots were  identified, an aggressive laminotomies were  performed and the under biting of the superior articular facet at L4 was  carried out to gain lateral margins in the disk space.  The epidural  veins coagulated.  Disk space was incised.  This was noted to be  markedly degenerated and collapsed, so a size 7 distractor was inserted  into the patient's left side.  Then, disk space was cleaned out on the  right side.  A distractor was inserted on the right.  Fluoroscopy  confirmed this to be appropriate size of the graft material, so 8 x 22  mm Telamon PEEK cage was opened up as well as an 8 x 26 mm tangent  allograft wedge.  The cage was then packed with local autograft, mixed  with Actifuse.  Then working on the patient's left side, a size 8 cutter  and chisels were used to cut the endplates and scrape the residual disk  and cartilaginous endplate.  Then, the fluoroscopy was used at each step  along the way with a size 8 cutter and chiseling the endplates.  Thereafter, a Telamon was then inserted on the patient's left side.  Then, the right side distractor was removed.  Under fluoroscopy,  confirmed good position of the Telamon.  On the right side, this was  carried out in a similar fashion.  Local autograft was packed centrally  and right-sided tangent was then inserted.  Then after all the interbody  work had been done, fluoroscopy confirmed good position of the spacers.  Attention was taken to doing a pedicle screw placement.  Using a high-  speed drill, pilot holes were drilled at L3 on the left, cannulated with  the awl probed, tapped with a 5 x 5 tap, probed again and 6.5 x 45  screws inserted on the L3 on the left.  Fluoroscopy was used at each  step along the way to confirm depth of distractor using both internal  and external landmarks, also the screws confirmed to be in good  position.  The right side screw was inserted in a similar fashion.  After both screws had been placed, the wound  was copiously irrigated and  aggressive decortication was carried out at T-piece at L3 and tacking  onto the posterolateral fusion at L4.  Then, all the local autografts  were then packed posterolaterally.  50-mm rods were then placed.  The L3  screws compressed against L4.  Then a 420 cross-link was inserted.  The  neuroforamina were all reinspected, and  they were noted to be widely  patent with no migration of graft material.  Meticulous hemostasis was  maintained.  Gelfoam was overlaid on top of the dura.  Then postop  fluoroscopy confirmed good position of the screws, rods, and bone graft.  Then, large Hemovac drain was placed and the wound was closed in layers  with interrupted Vicryl and with a running 4-0 subcuticular to the skin.  Benzoin and Steri-Strips were applied.  The patient went to recovery  room in stable condition.  At the end of the case, instrument and sponge  counts were correct.           ______________________________  Donalee Citrin, M.D.     GC/MEDQ  D:  02/06/2009  T:  02/07/2009  Job:  [id number]

## 2010-12-21 NOTE — H&P (Signed)
NAME:  RAVYNN, HOGATE NO.:  [id number]   MEDICAL RECORD NO.:  [id number]          PATIENT TYPE:  EMS   LOCATION:  MAJO                         FACILITY:  [facility]   PHYSICIAN:  Eduard Clos, MDDATE OF BIRTH:  Dec 28, 1955   DATE OF ADMISSION:  03/29/2009  DATE OF DISCHARGE:                              HISTORY & PHYSICAL   PRIMARY CARE PHYSICIAN:  Lovenia Kim, D.O.   CHIEF COMPLAINT:  Abdominal pain.   HISTORY OF PRESENT ILLNESS:  A 55 year old female with known history of  hypothyroidism, low back pain with recent decompression surgery of the  lumbar spine presented with complaints of abdominal pain.  The patient  has been having abdominal pain with nausea and vomiting, persistent over  the last 3 or 4 days.  Had gone to her primary care physician, who did  labs and found LFTs abnormal.  She had persistent nausea and pain over  the week and was ordered some pain medication despite which the symptoms  persisted, and she decided to come the ER.  In the ER, the patient had  labs done which showed LFTs were in the 200s with alk phos in the 500s.  A sonogram of the abdomen showing cholelithiasis with a dilated common  bile duct measuring 10 mm.  At this time, the patient has been admitted  for further evaluation and management for possible obstructing  gallstone.   The patient denies any chest pain, shortness of breath, weakness of  limbs, focal deficits, headache, diarrhea, dysuria or discharges.  Patient's abdominal pain is diffuse, increased on eating, along with  nausea and vomiting.  Denies any blood in the vomitus.   PAST MEDICAL HISTORY:  Hypothyroidism, low back pain.   PAST SURGICAL HISTORY:  Two neck surgeries wherein she effusion and 4  low back surgeries.  Also had appendectomy.   MEDICATIONS ON ADMISSION:  Percocet 5/325 mg p.o. q.6h. p.r.n.,  hydromorphone 2 mg p.o. q.6h. p.r.n., promethazine 25 mg q.8h. p.r.n.  for nausea, and Synthroid  which she takes 115 mcg alternating with 75  mcg p.o.   ALLERGIES:  MORPHINE, CODEINE, and VICODIN.   FAMILY HISTORY:  Noncontributory.   SOCIAL HISTORY:  The patient smokes cigarettes, has been advised to quit  smoking.  Denies any alcohol or drug abuse.   REVIEW OF SYSTEMS:  As in history of present illness, nothing else  significant.   PHYSICAL EXAMINATION:  The patient examined at bedside, not in distress,  but still has some pain in the abdomen on the epigastric area.  VITAL SIGNS:  Her pressure 170/60, pulse 69 per minute, temperature  98.1, respiration 18 per minute, O2 sat 99.  HEENT: Anicteric.  No pallor.  CHEST:  Bilateral air entry present.  No rhonchi, no crepitation.  HEART:  S1 and S2 heard.  ABDOMEN:  Tenderness diffusely specifically on the right upper quadrant.  No guarding or rigidity.  Bowel sounds are heard.  No discoloration.  CNS:  Alert and oriented, place and person.  Moves all upper and lower  extremities 5/5.  EXTREMITIES:  Peripheral pulses felt.  No edema.   LABS:  Sonogram of the abdomen shows cholelithiasis without sonographic  findings for acute cholecystitis, dilated common bile duct measuring 10  mm, normal sonographic appearance of liver, spleen, and both kidneys.  Nonvisualization of pancreas.   CBC:  WBC 6.3, hemoglobin 13.4, hematocrit 39.8, platelets 301.  A basic  metabolic panel:  Sodium 139, potassium 3, chloride 101, carbon dioxide  25, glucose 105, BUN 8, creatinine 0.5, total bilirubin 2.1, alk phos  508, AST 275, ALT 49, calcium 9.6, lipase 12.  Pregnancy screen is  negative.  UA shows WBCs 7 to 10, nitrites negative, leukocytes small,  bacteria many.   ASSESSMENT:  1. Abdominal pain with gallstones, possible gallstones obstructing the      common bile duct.  2. Abnormal liver function tests secondary to above.  3. Possible urinary tract infection.  4. History of hypothyroidism.  5. Chronic low-back pain with multiple  surgeries.   PLAN:  1. Will admit the patient to medical floor.  2. Will place the patient on IV fluids and n.p.o.  3. I have discussed already with Dr. Wilfrid Lund of [place], who is going to      see the patient morning for possible ERCPx.  4. Possibly the patient will need cholecystectomy eventually during      this stay.  5. Will cover patient with ciprofloxacin and Flagyl.  6. Will get a urine culture.  7. Further recommendation as condition evolves.      Eduard Clos, MD  Electronically Signed     ANK/MEDQ  D:  03/29/2009  T:  03/29/2009  Job:  [id number]   cc:   Lovenia Kim, D.O.

## 2010-12-21 NOTE — Consult Note (Signed)
NAME:  Katie, Gaines                ACCOUNT NO.:  [id number]   MEDICAL RECORD NO.:  [id number]          PATIENT TYPE:  INP   LOCATION:  5154                         FACILITY:  [facility]   PHYSICIAN:  Gabrielle Dare. Janee Morn, M.D.DATE OF BIRTH:  03/11/1956   DATE OF CONSULTATION:  03/30/2009  DATE OF DISCHARGE:                                 CONSULTATION   REQUESTING PHYSICIAN:  Iva Boop, MD, [facility]   CONSULTING SURGEON:  Gabrielle Dare. Janee Morn, MD   PRIMARY CARE PHYSICIAN:  Lovenia Kim, DO   REASON FOR CONSULTATION:  Symptomatic cholelithiasis.   HISTORY OF PRESENT ILLNESS:  [name] is a 55 year old white female with  a history of hypothyroidism and low back pain who has had a week of  right upper quadrant abdominal pain with associated nausea and vomiting.  The patient denies any fevers.  She states that approximately a week ago  she began to have significant nausea and vomiting and since then has  been unable to keep any p.o. down.  Due to her nausea and vomiting along  with her abdominal pain, she presented to the emergency department  yesterday where she had an ultrasound that showed cholelithiasis with a  common bile duct of 10 mm but no evidence of cholecystitis.  At this  time, her total bilirubin was elevated to 2.1.  She was then admitted  and Gastroenterology was consulted.  There are plans for an ERCP  tomorrow.  Today, her total bilirubin has decreased to 0.8; however, she  saw an elevated alkaline phosphatase at 371 and elevated transaminases.  Due to these findings, we were asked to see the patient for laparoscopic  cholecystectomy post-ERCP.   REVIEW OF SYSTEMS:  Please see HPI, otherwise, the patient currently  still complains of back pain.  Otherwise, all other systems are  negative.   PAST MEDICAL HISTORY:  1. Chronic narcotic use secondary to low back pain and multiple back      surgeries.  2. Hypothyroidism.   PAST SURGICAL HISTORY:  1. Status post 8 back  surgeries.  2. Status post laparoscopic appendectomy.   SOCIAL HISTORY:  The patient is single.  She does have 1 child and 2  grandchildren.  She states she has smoked approximately a pack of  cigarettes within the last month and half.  Otherwise, she drinks  approximately only 4 times a year.   ALLERGIES:  1. MORPHINE.  2. CODEINE.  3. VICODIN.   MEDICATIONS:  1. Percocet 5/325 p.o. q.6 h. p.r.n.  2. Hydromorphone 2 mg p.o. q.6 h. p.r.n.  3. Promethazine 25 mg q.8 h. p.r.n. for nausea.  4. Synthroid 115 mcg alternating with 75 mcg p.o. daily.   PHYSICAL EXAMINATION:  GENERAL:  [name] is a pleasant, mildly  overweight 55 year old white female who is currently lying in bed in no  acute distress.  VITAL SIGNS:  Temperature 97.8, pulse 57, respirations 19, blood  pressure 105/70.  HEENT:  Head is normocephalic, atraumatic.  Sclerae are noninjected.  Pupils are equal, round, and reactive to light.  Ears and nose without  any obvious masses or lesions.  No rhinorrhea.  Mouth is pink and moist.  Throat shows no exudate.  NECK:  Supple.  Trachea is midline.  HEART:  Regular rate and rhythm.  Normal S1 and S2.  No murmurs,  gallops, or rubs noted  A +2 carotid, radial, and pedal pulses  bilaterally.  LUNGS:  Clear to auscultation bilaterally with no wheezes, rhonchi, or  rales noted.  Respiratory effort is nonlabored.  ABDOMEN:  Soft, mild right upper quadrant tenderness but no Eulah Pont sign  is present.  The patient does have active bowel sounds and is otherwise  nondistended.  She does not have any masses or hernias noted.  MUSCULOSKELETAL:  All 4 extremities are symmetrical with no cyanosis,  clubbing, or edema.  NEURO:  Cranial nerves II through XII appeared to be grossly intact.  PSYCH:  The patient is alert and oriented x3 with an appropriate affect.   LABORATORY DATA AND DIAGNOSTICS:  Ultrasound of the abdomen and pelvis  revealed a common bile duct, which is dilated up to  10 mm.  She does  have large gallstones present, but otherwise, no evidence of acute  cholecystitis.  White blood cell count 6000, hemoglobin 11.3, hematocrit  33.7, platelet count of 250,000.  Sodium 140, potassium 2.8, BUN 9,  creatinine 0.55, glucose 95, AST 161, ALT 137, alkaline phosphatase 37,  total bilirubin 0.8 which is down from 2.1 yesterday.   IMPRESSION:  1. Symptomatic cholelithiasis/questionable choledocholithiasis.  2. Chronic narcotic use secondary to low back pain and multiple back      surgeries.  3. Hypothyroidism.  4. Hypokalemia.   PLAN:  At this time, Gastroenterology is planning for an ERCP tomorrow  under general endotracheal anesthesia.  Post-ERCP, we will plan to do a  laparoscopic cholecystectomy on the patient.  We will follow the patient  along with you in preparation for laparoscopic cholecystectomy.      Letha Cape, PA      Gabrielle Dare. Janee Morn, M.D.  Electronically Signed    KEO/MEDQ  D:  03/30/2009  T:  03/31/2009  Job:  [id number]   cc:   Iva Boop, MD,FACG  Amy Elmer Ramp, D.O.

## 2010-12-24 NOTE — Op Note (Signed)
NAME:  Katie Gaines, Katie Gaines                          ACCOUNT NO.:  [id number]   MEDICAL RECORD NO.:  [id number]                   PATIENT TYPE:  OIB   LOCATION:  NA                                   FACILITY:  [facility]   PHYSICIAN:  Donalee Citrin, M.D.                     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   PROCEDURE:  Redo decompressive laminectomy, L4-5, L5-S1, with fragmentectomy  and microscopic dissection of the left L5 and S1 nerve roots.   SURGEON:  Donalee Citrin, M.D.   ASSISTANT:  Tia Alert, M.D.   ANESTHESIA:  General endotracheal.   HISTORY OF PRESENT ILLNESS:  The patient is a very pleasant 55 year old  female who has had longstanding back and leg pain that has been refractory  to conservative treatment over the last several months, got progressively  worse.  The patient had two previous laminectomies at L4-5 and L5-S1, and  repeat imaging showed a very large recurrent disk fragment that was behind  the L5 vertebral body, compressing the L5 nerve root against the L5 pedicle,  and it was very difficult to determine whether this was coming from the L4-5  disk space or the L5-S1 disk space.  Both had had previous surgery, so the  patient was recommended a two-level redo laminectomy unilaterally on the  left with resection of the disk fragment.  I extensively went over the risks  and benefits of surgery.  She understands and agreed to proceed forward.   The patient was brought to the OR, was induced under general anesthesia.  The back was prepped and draped in the usual sterile fashion.  Preoperative  x-ray localized the L5 pedicle and midline incision was made through the old  scar.  Bovie electrocautery was used to take down to the  subcutaneous tissue  and subperiosteal dissection was carried out along the scar tissue, exposing  the residual lamina of L4, L5,a and part of S1.  Then the residual lamina of  L4 was identified and intraoperative x-ray confirmed localization of the L5  pedicle.  Then using a 4 Penfield and 1 [place] as well as 2 and 3 mm  Kerrison punch, the scar tissue was freed from the undersurface of the  residual lamina and the medial aspect of the facet complex, and the inferior  aspect of L4 was extended cephalad.  Then the residual lamina of L5 was  identified and removed in its entirety, exposing the proximal aspect of the  S1 nerve root on the left.  Then after the medial facetectomy was completed,  the thecal sac was reflected medially with a 4 Penfield.  The  L5 root was  identified out its exit all along the pedicle, and there were noted to be  several large fragments of disk underneath the axilla of the L5 nerve root  as well as in the thecal sac distally.  With the thecal sac reflected  medially just distal to the takeoff of the L5 nerve root, several fragments  of disk were removed with a blunt nerve hook and a coronary dilator.  Then  the coronary dilator was used to explore the L5-S1 disk space and the S1  neural foramina and noted to be widely patent as well as the L4-5 disk  space, which was felt to be slightly bulging.  The D'Errico nerve root  retractor was then placed above the L5 nerve root and the L4-5 disk space  was inspected, noted to be mildly bulging, mostly scar tissue but not felt  to be compressive, and it was decided not to enter this with an annulotomy  blade.  At the end the nerve root was again re-explored with angled hockey  stick and coronary dilator and noting no further stenosis, the wound was  copiously irrigated and meticulous hemostasis was maintained, Gelfoam was  overlaid on the top of the dura, and the muscle and fascia were  reapproximated with 0  interrupted  Vicryl, the subcutaneous tissue was closed with 2-0 interrupted Vicryl, the  skin was closed with 4-0 subcuticular.  Benzoin and Steri-Strips applied.  The patient was taken to the recovery room in stable condition.  At the end  of the case all needle counts and sponge counts were reported as correct.                                               Donalee Citrin, M.D.    GC/MEDQ  D:  04/24/2003  T:  04/25/2003  Job:  [id number]

## 2010-12-24 NOTE — Op Note (Signed)
NAME:  Katie Gaines, Katie Gaines                          ACCOUNT NO.:  [id number]   MEDICAL RECORD NO.:  [id number]                   PATIENT TYPE:  INP   LOCATION:  3023                                 FACILITY:  [facility]   PHYSICIAN:  Donalee Citrin, M.D.                     DATE OF BIRTH:  1955-12-21   DATE OF PROCEDURE:  04/07/2004  DATE OF DISCHARGE:                                 OPERATIVE REPORT   PREOPERATIVE DIAGNOSIS:  Degenerative disk disease with mechanical back  pain, left side L5 and S1 radiculopathy from severe degenerative disease and  ruptured disk off of S1.   POSTOPERATIVE DIAGNOSIS:  Degenerative disk disease with mechanical back  pain, left side L5 and S1 radiculopathy from severe degenerative disease and  ruptured disk off of S1.   PROCEDURE:  Redo decompressive lumbar laminectomy L4-5, L5-S1, posterior  lumbar interbody fusion, L4-5, L5-S1, pedicle screw fixation, L4-S1,  posterior lateral arthrodesis using locally harvested Allograft L4-S1.  Placement of medium Hemovac drain.   SURGEON:  Donalee Citrin, M.D.   ASSISTANTYetta Barre   ANESTHESIA:  General endotracheal.   INDICATIONS:  The patient is a very pleasant 55 year old female who has had  longstanding back and left leg pain refractory to all forms of conservative  treatment.  The patient approximately one year ago had decompressive  laminectomy on the left at L4-5 and L5-S1.  The patient had recovered from  symptoms until six months with progressive worsening back greater than leg  pain.  The back pain was probable mechanical.  The leg pain was radiating  down to the outside of the foot and the top of the big toe as well as the  bone in her foot.  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  The patient was recommended after failure of  conservative treatment to have redo decompressive laminectomy and posterior  interbody fusion.  We discussed the risks and benefits of the surgery with  her.  She understood __________  .   DESCRIPTION OF PROCEDURE:  The patient was brought to the operating room  were general endotracheal anesthesia was administered.  She was placed prone  on the Wilson frame.  __________  old incision was opened up and  ellipticized and removed.  Bovie electrocautery was used to cut the scar  tissue and subperiosteal dissection carried out to the lamina of L3,  residual lamina of L4-5 and S1 bilaterally.  The TPs at L4-L5 and S1 were  identified, confirmed by intraoperative fluoroscopy.  Subcutaneous retractor  was placed.  Then the residual spinous process of L4 and medial facet  complexes were removed.  There was extensive scar tissue bilaterally.  This  was all teased away and freed up with the 4 Penfield and Insurance risk surveyor.  Normal dura was discovered in the central canal on the superior aspect of  the residual L4 lamina.  This was completely removed and then the L4 nerve  root was identified on each side and complete medial facetectomy were  completed at L4-5.  There was aggressive scar dissected free on the left  side above the L4 and L5 nerve roots.  After the scar was all freed up and  the facet had been removed, nerve space was identified.  The __________  nerve and the L5 nerve root was dissected off the interspace.  The L5 nerve  root was then tracked out and its neuroforamen noted to be severely stenotic  distally, predominantly from facet arthropathy and degenerative collapse  against the disk space and facet at L5-S1.  The scar tissue was freed up at  L5-S1 on the left side as well as the right and the facet was removed on  block and then morselized. After the scar tissue was freed up, the distal L5  nerve root was completely decompressed at its neuroforamen.  Then the S1  nerve root was identified on the left side as well and this foramen was also  widely opened  up.  The epidural vein was coagulated.  Disk space was  identified and the undersurface of the facet was under bitten.  On the right  side, this procedure was repeated, decompressing both the L4-5 and S1 roots  on the right side.  After all L4-5 and S1 roots both sides had been  completely decompressed at the neural foramen, attention was taken first to  pedicle screw placement using bony landmarks with a TPA of the mid lateral  facet complex and pars as well as intraoperative fluoroscopy and  intracanalicular palpation of the pedicle.  A pilot hole was drilled with a  high speed drill cannula with the hole tapped at 55 tap, probe and a 65 x 40  pedicle screw inserted L4 on the right.  Again, from within the canal,  palpation as well as in the pedicle, no medial and lateral breech was  appreciated.  This procedure was repeated at L5 and S1 on the right with a  65 x 40 in L5 and a 65 x 35 at S1.  The system was an M10 Legacy Pedicle  screw system.  Then on the left side, the procedure was repeated on the L4-5  and S1.  All six pedicle screws had good trajectory.  All had good purchase  within the pedicle as well as no breech palpated from within the pedicle or  within the canal.  After all pedicle screws had been placed, a Derrico was  used to reflect a right L5 nerve root medially.  Annulotomy was made in the  L4-5 disk space and a distractor was inserted.  This had good apposition in  the end plates and it was felt that Allografts to be adequate. Then the  Derrico was reflected to the left L5 nerve root medially.  Several large  fragments of disk were removed and the scar tissue was freed up.  Then the  left side was cut with a size 8 cutter and chisel and an 8 x 20 mm Allograft  was sewn on the left side.  Then on the right side, the distractor was  removed, fluoroscopy confirming at each step along the way.  Disk space again was cleaned out using a size 8 cutter and chisel.  Downgoing  Epstein  curet was used  to scrape off the central disk space.  Locally harvested  __________  was packed against the left side of the Allograft and the right  side of the Allograft was inserted.  Fluoroscopy confirmed good position of  both Allografts.  Then the L5-S1 again on the right extensive scar was freed  up off the right L5-S1 disk space from a remote laminectomy many years ago.  There was a large disk herniation centrally on the right at L5-S1.  This was  teased with downgoing Epstein curet.  This was cleaned out and a distractor  was inserted here.  This also had good apposition of the end plates and 8  grafts were felt to be adequate.  Derrico then was used to reflect the left  S1 nerve root; medially and distally this was cleaned out.  A size 8 cutter  and chisel was used for the end plates and the __________  was sewn on the  left side.  Then again, the right side disk space was cleaned out.  Locally  harvested harvest was packed against the left side of the Allograft and the  right side Allograft was inserted again and fluoroscopy confirming each step  on the way and good position of all instruments.  Then after all four grafts  had been placed, the wound was copiously irrigated __________  .  Decortication was carried out on TPs and lateral gutters.  The remainder of  the locally harvested harvest was packed in the lateral gutters along with  TPs and lateral facet complexes.  Then two __________  rods was size  selected and inserted, __________  tightened down on each of the six screws.  The S1 pedicle screw was torqued down.  The L5 pedicle screw was compressed  against S1 and the L4 compressed against L5 bilaterally.  Then a 422 cross  clamp was inserted and all neuroforamina reexplored confirming no graft  material migration as well as wide decompression.  Gelfoam was laid over top  of the dura.  The muscle and fascia were reapproximated with 0 interrupted  Vicryl to be  placed on the drain, and then the __________ with 2-0  interrupted undyed Vicryl, and the skin was closed with a running 4-0  subcuticular.  Benzoin and Steri-Strips were applied.  The patient went to  the recovery room in stable condition.                                               Donalee Citrin, M.D.    GC/MEDQ  D:  04/07/2004  T:  04/07/2004  Job:  [id number]

## 2010-12-24 NOTE — Op Note (Signed)
NAME:  Katie Gaines, Katie Gaines                ACCOUNT NO.:  [id number]   MEDICAL RECORD NO.:  [id number]          PATIENT TYPE:  AMB   LOCATION:  SDS                          FACILITY:  [facility]   PHYSICIAN:  Reinaldo Meeker, M.D. DATE OF BIRTH:  17-May-1956   DATE OF PROCEDURE:  10/20/2005  DATE OF DISCHARGE:                                 OPERATIVE REPORT   PREOPERATIVE DIAGNOSIS:  Failed back syndrome.   POSTOPERATIVE DIAGNOSIS:  Failed back syndrome.   OPERATION PERFORMED:  Permanent spinal stimulator implant.   SURGEON:  Reinaldo Meeker, M.D.   ANESTHESIA:  General.   DESCRIPTION OF PROCEDURE:  After being placed in prone position, the  patient's back and buttock on the left side were prepped and draped in the  usual sterile fashion.  Fluoroscopy was used prior to the incision to  identify the appropriate level.  Midline incision was made over the spinous  processes of T10 and T11.  Using Bovie cutting current, the incision was  carried down to the spinous processes.  Subperiosteal dissection was then  carried out on the left side of the spinous processes and lamina.  Self-  retaining retractor was placed for exposure.  X-ray showed approach to the  appropriate level.  Left-sided laminotomy of T10 was then performed to  expose the appropriate amount of dura.  Any fatty tissue and ligament  covering the epidural space were removed until the spinal dura was well  visualized.  A multicontact paddle lead was then chosen and placed into good  position without difficulty.  A pocket incision was then made in the upper  left buttock and carried down approximately 1 cm deep.  A pocket was made  for the battery.  A passer was then passed from the buttock incision to the  thoracic incision and the leads brought down from a superior to inferior  direction.  The battery was then connected, grounded and tested and found to  be working well.  It was then placed in the pocket without difficulty.  A  simple anchor stitch to the fascia was placed around the lead in the  thoracic region.  Both wounds were then irrigated copiously.  Any bleeding  in the thoracic region was controlled with bone wax and Gelfoam.  The wounds  were then closed in multiple layers of Vicryl on the muscle, fascial,  subcutaneous tissue and subcuticular tissues and staples were placed on the  skin.  Sterile dressings were then applied and the patient was extubated and  taken to recovery in stable condition.           ______________________________  Reinaldo Meeker, M.D.     ROK/MEDQ  D:  10/20/2005  T:  10/22/2005  Job:  [id number]

## 2010-12-24 NOTE — Op Note (Signed)
[place]  Patient:    Katie Gaines, Katie Gaines Visit Number: [id number] MRN: [id number]          Service Type: DSU Location: 3000 [id number] Attending Physician:  Mariam Dollar Dictated by:   Garlon Hatchet., M.D. Proc. Date: 10/18/01 Admit Date:  10/18/2001 Discharge Date: 10/18/2001                             Operative Report  PREOPERATIVE DIAGNOSIS:  Cervical spondylosis, C6-7 with bilateral C7 radiculopathy, status post endocervical diskectomy and fusion at C4-5 and C5-6.  POSTOPERATIVE DIAGNOSIS:  Cervical spondylosis, C6-7 with bilateral C7 radiculopathy, status post endocervical diskectomy and fusion at C4-5 and C5-6.  PROCEDURE:  Removal of hardware of C4-5 and C5-6, and anterior cervical diskectomy and fusion at C6-7 with a 6 mm patellar wedge and a 25 mm Atlantis plate with four 15 mm variable angled screws, and two rescue screws placed at C6 and two in C7.  SURGEON:  Garlon Hatchet., M.D.  FIRST ASSISTANT:  Reinaldo Meeker, M.D.  ANESTHESIA:  General endotracheal.  INDICATIONS:  The patient is a very pleasant 55 year old female who approximately a year-and-a-half ago had undergone endocervical diskectomy and fusion at C4-5 and C5-6.  The patient did very well from this procedure. However, over the last several months has gotten progressively worsening pain in her neck and with radiation down both arms to her first two fingers. Preoperative imaging showed severe degeneration and collapse of the C6-7 disk space and spondylitic spur formation and some hypertrophic compressing both C7 nerve roots, greater on the left.  The patient has failed conservative treatment and was recommended anterior cervical diskectomy and fusion at C6-7. Extensively counseled on the risks and benefits of removal of cervical plates from C4 to C6, and anterior cervical diskectomy and fusion.  DESCRIPTION OF PROCEDURE:  The patient was brought to the OR and was  induced under general anesthesia, positioned supine with a shoulder roll and the neck in slight extension.  The right side of the neck was prepped and draped in the usual sterile fashion.  Her old incision was incised with a ____ blade scalpel.  Then, Bovie electrocautery was used to dissect down to the ______ which was divided longitudinally.  The ______ to the sternocleidomastoid. The omohyoid and strap muscles was developed down to the prevertebral fascia and there is a tremendous amount of scar tissue between the sternocleidomastoid and the strap muscles.  This was dissected free with hemostats, and then the carotid was identified and reflected laterally.  The esophagus was identified and reflected medially, and there was scar tissue overlying the C4 and C6 plate.  Was dissected away with [place] and Bovie electrocautery.  The set screws were loosened and all six screws were removed of the Atlantis plate system from C4, C5, and C6, and the plate was dissected free and removed.  Then, the longus colli was then reflected laterally, and the C6-7 disk space was identified.  Intraoperative x-ray confirmed localization in the C6-7 disk space.  This was noted be severely collapse. After the self-retaining retractor was placed and annulotomy made, pituitary rongeurs were used to remove the anterior margin of the annulus.  The remainder of the disk space was removed and drilled down with a high speed drill.  The operating microscope was draped and brought in the field and under microscope illumination, the remainder of the endplates were then prepared  and drilled down to posterior osteophyte.  Then using 1 mm and 2 mm Kerrison punch, a large osteophyte coming off the C6 vertebral body as well as the uncinate process of C7 were removed, decompressing both C7 neural foramen. The ligamentum flavum was identified and removed in a piecemeal fashion.  Both C7 nerve roots were widely decompressed and  explored with the angled nerve hook and noted to have no further spinal compression.  At the end of diskectomy, the thecal sac and both C7 nerve roots were decompressed.  The endplates were then prepared to receive bone graft.  Using the interbody spreader, the vertebral bodies were distracted, and a 6 mm Tutogen patellar wedge was inserted up to 2 mm, and will cut off the depth of it.  Then, a 25 mm Atlantis plate was selected.  The two previous holes in C6 were used and two 15 mm rescue screws were used to replace the two 13 mm screws that had been there before.  Then, the screws at C7 were drilled, tapped, and two 15 mm variable angled screws were inserted there.  The fusion construct at C4-5 and C5-6 was noted to be solid, and meticulous hemostasis was maintained.  The wound was copiously irrigated.  The platysma was reapproximated with 3-0 interrupted Vicryls.  The skin was closed with running 4-0 subcuticular.  Benzoin and Steri-Strips were applied.  The patient went to the recovery room in stable condition.  At the end of the case, the needle count and sponge count were correct. Dictated by:   Garlon Hatchet., M.D. Attending Physician:  Mariam Dollar DD:  10/18/01 TD:  10/20/01 Job: [id number]

## 2010-12-24 NOTE — Op Note (Signed)
[place]  Patient:    Katie Gaines, Katie Gaines                         MRN: [id number] Proc. Date: 02/21/00 Adm. Date:  [id number] Attending:  Donalee Citrin P                           Operative Report  PREOPERATIVE DIAGNOSIS: Cervical radiculopathy, C5 and C6, left greater than right.  POSTOPERATIVE DIAGNOSIS: Cervical radiculopathy, C5 and C6, left greater than right.  OPERATION/PROCEDURE:  Anterior cervical diskectomy and fusions at C4-5, C5-6 with fibular allograft 6 mm plugs and Atlantis plating system with 13 mm screws.  ANESTHESIA: General endotracheal.  SURGEON: Donzetta Sprung. Roney Jaffe., MD  ASSISTANT:  Reinaldo Meeker, M.D.  IV FLUIDS: 1200 cc  HISTORY OF PRESENT ILLNESS:  The patient is a 55 year old female who presents with long-standing neck and arm pain, left greater than right with radiation down into her shoulder and into her fingers. Preoperative imaging and MRI shows multilevel cervical spondylosis with significant foraminal disease and central canal stenosis at C4-5 and C5-6. The patient presents for decompression.  DESCRIPTION OF PROCEDURE:  The patient was brought to the operating room and induced with general anesthesia and was positioned supine on the bed with a slight shoulder roll and the head in mild extension and turned slightly to the left. The right side of her neck was prepped and draped in the routine sterile fashion. The preoperative x-ray confirmed localization of the C5 vertebral body. Incision was drawn out at this point. Incision was made with a 20 blade scalpel. Bovie electrocautery was used to take through the dermis. The superficial layer of the platysma was dissected out with Metzenbaum scissors and the incision was divided transversely with Bovie electrocautery. Plane was developed between the sternocleidomastoid and strap muscles was developed down to the spine. Intraoperative x-ray confirmed the C5-6 interspace.  Bovie electrocautery was used take down the longus coli laterally and clean off the vertebral bodies at C4-5 and C5-6. The interspace at C5-6 had been marked for this x-ray and then complete diskectomies were initiated with the Limbus scope used and the pituitary rongeurs with the BA and FA curettes used to remove the anterior margin of the anulus and the nucleus pulposus. Then Midas Rex was brought in and placed. The disc was widened down to the posterior longitudinal ligament. This was then undermined and underbitten with the 1 and 2 mm Kerrison punches. Diskectomy was carried out laterally until the proximal aspect of the neural foramen at C5 were identified first at the C4-5 interspace. The rest of the longitudinal ligament was underbitten from the end plates and the thecal sac was then completely decompressed. The nerve roots were  completely decompressed as well. Gelfoam was placed. Attention was taken to the C5-6 interspace. This procedure was repeated. Again, Midas Rex was used to widen the disk space down to the posterior longitudinal ligament and 1 mm and 2 mm Kerrison punches were used to underbite the posterior longitudinal ligament and then the diskectomy was carried out laterally until the proximal aspects of the C6 foramen were visualized. These were probed down with the hook and completely compressed. The thecal sac was know to be completely compressed. There was a large osteophyte in the inferior endplate of C5 compressing the spinal cord. This was removed carefully with a 1 and 2 mm Kerrison punches.  At the end of the diskectomy at C5-6, the thecal sac and both C6 nerve roots were noted to be completely compressed. The endplates were prepared and a 6 mm fibular allograft was inserted 1-2 mm deep to the anterior margin of the vertebral bodies. This procedure was repeated at C4-5. The operating microscope was removed and attention was taken to the plate. A 42.5 mm Atlantis  plate was selected and using a drill guide and a 13 mm drill, the screw in the inferior left side and the C6 body was drilled, tapped, and a 13 mm screw was inserted. This procedure was repeated up in the C4 vertebral body in the opposite side and then another screw in C4 and two screws in C5 and another in C6 were all done in the same fashion. The wound was copiously irrigated and good hemostasis was maintained. Postoperative x-ray confirmed good localization of bone graft and plate and the wound was closed. The platysma was closed with 3-0 interrupted Vicryls. The skin was closed with running 4-0 subcuticular and Steri-Strips and Benzoin were applied. The wound was dressed. The patient went to the recovery room in stable condition at the end of the case. All needle counts and sponge counts were correct. DD: 02/21/00 TD:  02/21/00 Job: [id number]

## 2011-03-02 ENCOUNTER — Other Ambulatory Visit: Payer: Self-pay | Admitting: Internal Medicine

## 2011-03-02 ENCOUNTER — Ambulatory Visit
Admission: RE | Admit: 2011-03-02 | Discharge: 2011-03-02 | Disposition: A | Discharge status: Home / Self Care | Payer: Medicare Other | Source: Ambulatory Visit | Attending: Internal Medicine | Admitting: Internal Medicine

## 2011-03-02 DIAGNOSIS — R52 Pain, unspecified: Secondary | ICD-10-CM

## 2011-03-02 DIAGNOSIS — R609 Edema, unspecified: Secondary | ICD-10-CM

## 2011-03-08 ENCOUNTER — Ambulatory Visit
Admission: RE | Admit: 2011-03-08 | Discharge: 2011-03-08 | Disposition: A | Discharge status: Home / Self Care | Payer: Medicare Other | Source: Ambulatory Visit | Attending: Internal Medicine | Admitting: Internal Medicine

## 2011-03-08 ENCOUNTER — Other Ambulatory Visit: Payer: Self-pay | Admitting: Internal Medicine

## 2011-03-08 DIAGNOSIS — M25472 Effusion, left ankle: Secondary | ICD-10-CM

## 2011-03-08 DIAGNOSIS — R52 Pain, unspecified: Secondary | ICD-10-CM

## 2011-03-08 DIAGNOSIS — M25572 Pain in left ankle and joints of left foot: Secondary | ICD-10-CM

## 2011-03-08 DIAGNOSIS — R609 Edema, unspecified: Secondary | ICD-10-CM

## 2011-05-06 LAB — DIFFERENTIAL
Basophils Absolute: 0
Basophils Relative: 1
Lymphocytes Relative: 29
Monocytes Relative: 6
Neutro Abs: 2.7
Neutrophils Relative %: 61

## 2011-05-06 LAB — CBC
MCHC: 33.5
RBC: 4.66

## 2011-05-06 LAB — BASIC METABOLIC PANEL
CO2: 29
Calcium: 9.3
Creatinine, Ser: 0.55
GFR calc Af Amer: >60
GFR calc non Af Amer: >60

## 2011-05-26 ENCOUNTER — Other Ambulatory Visit: Payer: Self-pay | Admitting: Internal Medicine

## 2011-05-26 DIAGNOSIS — R7989 Other specified abnormal findings of blood chemistry: Secondary | ICD-10-CM

## 2011-05-30 ENCOUNTER — Ambulatory Visit
Admission: RE | Admit: 2011-05-30 | Discharge: 2011-05-30 | Disposition: A | Discharge status: Home / Self Care | Payer: Medicare Other | Source: Ambulatory Visit | Attending: Internal Medicine | Admitting: Internal Medicine

## 2011-05-30 DIAGNOSIS — R7989 Other specified abnormal findings of blood chemistry: Secondary | ICD-10-CM

## 2011-07-13 ENCOUNTER — Other Ambulatory Visit: Payer: Self-pay | Admitting: Internal Medicine

## 2011-07-13 DIAGNOSIS — R110 Nausea: Secondary | ICD-10-CM

## 2011-07-13 DIAGNOSIS — R1011 Right upper quadrant pain: Secondary | ICD-10-CM

## 2011-07-13 DIAGNOSIS — R748 Abnormal levels of other serum enzymes: Secondary | ICD-10-CM

## 2011-07-15 ENCOUNTER — Ambulatory Visit
Admission: RE | Admit: 2011-07-15 | Discharge: 2011-07-15 | Disposition: A | Discharge status: Home / Self Care | Payer: Medicare Other | Source: Ambulatory Visit | Attending: Internal Medicine | Admitting: Internal Medicine

## 2011-07-15 DIAGNOSIS — R748 Abnormal levels of other serum enzymes: Secondary | ICD-10-CM

## 2011-07-15 DIAGNOSIS — R1011 Right upper quadrant pain: Secondary | ICD-10-CM

## 2011-07-15 DIAGNOSIS — R110 Nausea: Secondary | ICD-10-CM

## 2011-07-15 MED ORDER — IOHEXOL 300 MG/ML  SOLN
125.0000 mL | Freq: Once | INTRAMUSCULAR | Status: AC | PRN
  Administered 2011-07-15: 125 mL via INTRAVENOUS

## 2011-07-18 ENCOUNTER — Telehealth: Payer: Self-pay | Admitting: Gastroenterology

## 2011-07-18 ENCOUNTER — Other Ambulatory Visit: Payer: Self-pay | Admitting: Gastroenterology

## 2011-07-18 DIAGNOSIS — K8020 Calculus of gallbladder without cholecystitis without obstruction: Secondary | ICD-10-CM

## 2011-07-18 NOTE — Telephone Encounter (Signed)
Patient needs to be seen ASAP. Have either Amy or Gunnar Fusi see the patient. She will need to be scheduled for a ERCP this week with MAC.  Please go ahead and schedule this. Right for Cipro 40 mg IV for the procedure.

## 2011-07-18 NOTE — Telephone Encounter (Signed)
Pt states she has had elevated LFT's and that she had a CT scan done 07/15/11.  IMPRESSION:  1. Progressive intra and extrahepatic biliary dilatation.  Increased density in the lower common bile duct is suspicious for  common bile duct stone or stones. ERCP or MRCP is suggested for  further evaluation. No pancreatic head mass.  2. No other significant abdominal/pelvic findings.  Original Report Authenticated By: P. Loralie Champagne, M.D.  Pt wants to know what she needs to have done. Dr. Arlyce Dice please advise.

## 2011-07-18 NOTE — Telephone Encounter (Signed)
Pt scheduled to see Willette Cluster NP tomorrow at 10:30am, ERCP scheduled for tomorrow at 1:30pm. Pt instructed to be NPO after midnight. Case [id number]. Scheduled with MAC and Cipro 400mg  IV ordered.

## 2011-07-19 ENCOUNTER — Encounter: Payer: Self-pay | Admitting: Nurse Practitioner

## 2011-07-19 ENCOUNTER — Ambulatory Visit (INDEPENDENT_AMBULATORY_CARE_PROVIDER_SITE_OTHER): Payer: Medicare Other | Admitting: Nurse Practitioner

## 2011-07-19 ENCOUNTER — Ambulatory Visit (HOSPITAL_COMMUNITY): Payer: Medicare Other

## 2011-07-19 ENCOUNTER — Ambulatory Visit (HOSPITAL_COMMUNITY): Payer: Medicare Other | Admitting: Anesthesiology

## 2011-07-19 ENCOUNTER — Encounter (HOSPITAL_COMMUNITY): Payer: Self-pay | Admitting: Gastroenterology

## 2011-07-19 ENCOUNTER — Encounter (HOSPITAL_COMMUNITY): Payer: Self-pay | Admitting: Anesthesiology

## 2011-07-19 ENCOUNTER — Encounter (HOSPITAL_COMMUNITY): Payer: Self-pay | Admitting: *Deleted

## 2011-07-19 ENCOUNTER — Ambulatory Visit (HOSPITAL_COMMUNITY)
Admission: RE | Admit: 2011-07-19 | Discharge: 2011-07-19 | Disposition: A | Discharge status: Home / Self Care | Payer: Medicare Other | Source: Ambulatory Visit | Attending: Gastroenterology | Admitting: Gastroenterology

## 2011-07-19 ENCOUNTER — Other Ambulatory Visit (INDEPENDENT_AMBULATORY_CARE_PROVIDER_SITE_OTHER): Payer: Medicare Other

## 2011-07-19 ENCOUNTER — Encounter (HOSPITAL_COMMUNITY)
Admission: RE | Disposition: A | Discharge status: Home / Self Care | Payer: Self-pay | Source: Ambulatory Visit | Attending: Gastroenterology

## 2011-07-19 VITALS — BP 140/80 | HR 84 | Ht 66.0 in | Wt 206.8 lb

## 2011-07-19 DIAGNOSIS — E039 Hypothyroidism, unspecified: Secondary | ICD-10-CM | POA: Insufficient documentation

## 2011-07-19 DIAGNOSIS — Z79899 Other long term (current) drug therapy: Secondary | ICD-10-CM | POA: Insufficient documentation

## 2011-07-19 DIAGNOSIS — R9389 Abnormal findings on diagnostic imaging of other specified body structures: Secondary | ICD-10-CM

## 2011-07-19 DIAGNOSIS — R7989 Other specified abnormal findings of blood chemistry: Secondary | ICD-10-CM

## 2011-07-19 DIAGNOSIS — Z87442 Personal history of urinary calculi: Secondary | ICD-10-CM | POA: Insufficient documentation

## 2011-07-19 DIAGNOSIS — K769 Liver disease, unspecified: Secondary | ICD-10-CM

## 2011-07-19 DIAGNOSIS — K8020 Calculus of gallbladder without cholecystitis without obstruction: Secondary | ICD-10-CM

## 2011-07-19 DIAGNOSIS — Z9089 Acquired absence of other organs: Secondary | ICD-10-CM | POA: Insufficient documentation

## 2011-07-19 DIAGNOSIS — K8050 Calculus of bile duct without cholangitis or cholecystitis without obstruction: Secondary | ICD-10-CM | POA: Insufficient documentation

## 2011-07-19 DIAGNOSIS — R945 Abnormal results of liver function studies: Secondary | ICD-10-CM

## 2011-07-19 DIAGNOSIS — F411 Generalized anxiety disorder: Secondary | ICD-10-CM | POA: Insufficient documentation

## 2011-07-19 DIAGNOSIS — K831 Obstruction of bile duct: Secondary | ICD-10-CM

## 2011-07-19 HISTORY — PX: ERCP: SHX5425

## 2011-07-19 LAB — CBC
HCT: 30.8 % — ABNORMAL LOW (ref 36.0–46.0)
Hemoglobin: 9.4 g/dL — ABNORMAL LOW (ref 12.0–15.0)
MCH: 24 pg — ABNORMAL LOW (ref 26.0–34.0)
MCHC: 30.5 g/dL (ref 30.0–36.0)
MCV: 78.8 fL (ref 78.0–100.0)
RBC: 3.91 MIL/uL (ref 3.87–5.11)

## 2011-07-19 LAB — HEPATIC FUNCTION PANEL
ALT: 41 U/L — ABNORMAL HIGH (ref 0–35)
AST: 36 U/L (ref 0–37)
Albumin: 3.4 g/dL — ABNORMAL LOW (ref 3.5–5.2)
Bilirubin, Direct: 0.2 mg/dL (ref 0.0–0.3)

## 2011-07-19 SURGERY — ERCP, WITH INTERVENTION IF INDICATED
Anesthesia: Monitor Anesthesia Care

## 2011-07-19 MED ORDER — CIPROFLOXACIN IN D5W 400 MG/200ML IV SOLN
400.0000 mg | Freq: Once | INTRAVENOUS | Status: AC
  Administered 2011-07-19: 400 mg via INTRAVENOUS

## 2011-07-19 MED ORDER — PROMETHAZINE HCL 25 MG/ML IJ SOLN: 6.2500 mg | INTRAMUSCULAR | Status: DC | PRN

## 2011-07-19 MED ORDER — SODIUM CHLORIDE 0.9 % IV SOLN
INTRAVENOUS | Status: DC | PRN
  Administered 2011-07-19: 15:00:00

## 2011-07-19 MED ORDER — LACTATED RINGERS IV SOLN
INTRAVENOUS | Status: DC
  Administered 2011-07-19: 1000 mL via INTRAVENOUS

## 2011-07-19 MED ORDER — LACTATED RINGERS IV SOLN
INTRAVENOUS | Status: DC | PRN
  Administered 2011-07-19: 14:00:00 via INTRAVENOUS

## 2011-07-19 MED ORDER — PROPOFOL 10 MG/ML IV EMUL
INTRAVENOUS | Status: DC | PRN
  Administered 2011-07-19: 100 ug/kg/min via INTRAVENOUS

## 2011-07-19 MED ORDER — FENTANYL CITRATE 0.05 MG/ML IJ SOLN
INTRAMUSCULAR | Status: DC | PRN
  Administered 2011-07-19: 50 ug via INTRAVENOUS

## 2011-07-19 MED ORDER — PROPOFOL 10 MG/ML IV EMUL: INTRAVENOUS | Status: DC | PRN

## 2011-07-19 MED ORDER — MIDAZOLAM HCL 5 MG/5ML IJ SOLN
INTRAMUSCULAR | Status: DC | PRN
  Administered 2011-07-19: 2 mg via INTRAVENOUS

## 2011-07-19 MED ORDER — CIPROFLOXACIN IN D5W 400 MG/200ML IV SOLN
400.0000 mg | Freq: Two times a day (BID) | INTRAVENOUS | Status: DC
  Filled 2011-07-19 (×5): qty 200

## 2011-07-19 MED ORDER — FENTANYL CITRATE 0.05 MG/ML IJ SOLN: 25.0000 ug | INTRAMUSCULAR | Status: DC | PRN

## 2011-07-19 NOTE — Transfer of Care (Signed)
Immediate Anesthesia Transfer of Care Note  Patient: Katie Gaines  Procedure(s) Performed:  ENDOSCOPIC RETROGRADE CHOLANGIOPANCREATOGRAPHY (ERCP)  Patient Location: PACU and Endoscopy Unit  Anesthesia Type: MAC  Level of Consciousness: awake and alert   Airway & Oxygen Therapy: Patient Spontanous Breathing and Patient connected to nasal cannula oxygen  Post-op Assessment: Report given to PACU RN and Post -op Vital signs reviewed and stable  Post vital signs: stable  Complications: No apparent anesthesia complications

## 2011-07-19 NOTE — Progress Notes (Signed)
Katie Gaines [id number] 13-Aug-1955   HISTORY OR PRESENT ILLNESS : Patient is a 55 year old female who is known to Dr. Arlyce Dice for a history of CBD stones. She was found to have bililary dilation by CTscan in July 2009. ERCP also revealed diffuse ductal dilation. A 12mm sphincterotomy was made and balloon sweep done with evidence of CBD stones. Bile duct brushing c/w atypical cells. In August 2010 patient admitted with symptomatic gallstones, elevated LFTs. She underwent laparoscopic cholecystectomy during which time IOC was done and abnormal. Patient had another ERCP with findings of a large CBD stone. The previous sphincterotomy was extended. The stone couldn't be removed in its entirety, a stent was placed. Several weeks later patient had follow up ERCP with stent removal and extraction of multiple stones. At that time a choledochoscope was used to visualize the CBD and no remaining stones were seen. Patient had a CTscan four days ago for evaluation of upper abdominal discomfort and elevated LFTs. Findings included marked intrahepatic biliary dilatation, progressive since July 2011 CTscan. Patient complains of fatigue. She endorses pruritis, occasional dark urine. No weight loss. No significant nausea.    Current Medications, Allergies, Past Medical History, Past Surgical History, Family History and Social History were reviewed in [employer] record.   PHYSICAL EXAMINATION : General: Well developed  female in no acute distress Head: Normocephalic and atraumatic Eyes:  sclerae anicteric,conjunctive pink. Ears: Normal auditory acuity Mouth: No deformity or lesions Neck: Supple, no masses.  Lungs: Clear throughout to auscultation Heart: Regular rate and rhythm; no murmurs heard Abdomen: Soft, nondistended, nontender. No masses or hepatomegaly noted. Normal bowel sounds Musculoskeletal: Symmetrical with no gross deformities  Skin: No lesions on visible  extremities Extremities: Trace bilateral lower extremity edema. Neurological: oriented x 4, grossly nonfocal Cervical Nodes:  No significant cervical adenopathy Psychological:  Alert and cooperative.    ASSESSMENT AND PLAN :

## 2011-07-19 NOTE — Op Note (Signed)
[facility] [address] [place], [place]  [address]  ERCP PROCEDURE REPORT  PATIENT:  Katie Gaines, Katie Gaines  MR#:  [id number] BIRTHDATE:  02/04/1956  GENDER:  female  ENDOSCOPIST:  Barbette Hair. Arlyce Dice, MD ASSISTANT:  PROCEDURE DATE:  07/19/2011 PROCEDURE:  ERCP with removal of stones ASA CLASS:  Class II  INDICATIONS:  suspected stone  MEDICATIONS:   MAC sedation, administered by CRNA, cipro 400 mg IV TOPICAL ANESTHETIC:  DESCRIPTION OF PROCEDURE:   After the risks benefits and alternatives of the procedure were thoroughly explained, informed consent was obtained.  The Pentax ERCP [id number] endoscope was introduced through the mouth and advanced to the third portion of the duodenum.  A previous sphincterotomy was noted. A sphincterotomy papilla was noted.  A stone was found. the common bile duct was slightly cannulated. Marked dilatation throughout the entire biliary tree was noted. There is a possible 3-4 mm stone in the distal duct.  A balloon stone extractor was placed a 15 mm and pulled down the bile duct and across the sphincterotomy papilla. The balloon was slept on several occasions. No stones are seen in the duodenum. Multiple cholangiograms did not show evidence of any retained stones following multiple sweeps of the bile duct by the balloon stone extractor.    The scope was then completely withdrawn from the patient and the procedure terminated.  COMPLICATIONS:  None  ENDOSCOPIC IMPRESSION: Probable retained or recurrent bile duct stone-status post extraction  RECOMMENDATIONS: Repeat LFTs today and in 10 days  ______________________________ Barbette Hair. Arlyce Dice, MD  CC:  Marisue Brooklyn, DO  n. eSIGNED:   Barbette Hair. Kaplan at 07/19/2011 02:54 PM  Lorel Monaco, [phone]

## 2011-07-19 NOTE — Anesthesia Preprocedure Evaluation (Signed)
Anesthesia Evaluation  Patient identified by MRN, date of birth, ID band Patient awake    Reviewed: Allergy & Precautions, H&P , NPO status , Patient's Chart, lab work & pertinent test results, reviewed documented beta blocker date and time   Airway Mallampati: II TM Distance: >3 FB Neck ROM: Full    Dental  (+) Dental Advisory Given   Pulmonary neg pulmonary ROS,  clear to auscultation        Cardiovascular neg cardio ROS Regular Normal Denies cardiac symptoms   Neuro/Psych Negative Neurological ROS  Negative Psych ROS   GI/Hepatic negative GI ROS, Gallstones   Endo/Other  Hypothyroidism On replacement  Renal/GU negative Renal ROS  Genitourinary negative   Musculoskeletal negative musculoskeletal ROS (+)   Abdominal   Peds negative pediatric ROS (+)  Hematology negative hematology ROS (+)   Anesthesia Other Findings Front caps  Reproductive/Obstetrics negative OB ROS                           Anesthesia Physical Anesthesia Plan  ASA: II  Anesthesia Plan: MAC and General   Post-op Pain Management:    Induction: Intravenous  Airway Management Planned: Mask  Additional Equipment:   Intra-op Plan:   Post-operative Plan:   Informed Consent: I have reviewed the patients History and Physical, chart, labs and discussed the procedure including the risks, benefits and alternatives for the proposed anesthesia with the patient or authorized representative who has indicated his/her understanding and acceptance.     Plan Discussed with: CRNA and Surgeon  Anesthesia Plan Comments:         Anesthesia Quick Evaluation

## 2011-07-19 NOTE — Anesthesia Postprocedure Evaluation (Signed)
  Anesthesia Post-op Note  Patient: Katie Gaines  Procedure(s) Performed:  ENDOSCOPIC RETROGRADE CHOLANGIOPANCREATOGRAPHY (ERCP)  Patient Location: PACU  Anesthesia Type: MAC  Level of Consciousness: oriented and sedated  Airway and Oxygen Therapy: Patient Spontanous Breathing  Post-op Pain: mild  Post-op Assessment: Post-op Vital signs reviewed, Patient's Cardiovascular Status Stable, Respiratory Function Stable and Patent Airway  Post-op Vital Signs: stable  Complications: No apparent anesthesia complications

## 2011-07-19 NOTE — Transfer of Care (Signed)
Immediate Anesthesia Transfer of Care Note  Patient: Katie Gaines  Procedure(s) Performed:  ENDOSCOPIC RETROGRADE CHOLANGIOPANCREATOGRAPHY (ERCP)  Patient Location: PACU  Anesthesia Type: MAC  Level of Consciousness: awake, alert  and patient cooperative  Airway & Oxygen Therapy: Patient Spontanous Breathing and Patient connected to nasal cannula oxygen  Post-op Assessment: Report given to PACU RN, Post -op Vital signs reviewed and stable and Patient moving all extremities  Post vital signs: Reviewed and stable  Complications: No apparent anesthesia complications

## 2011-07-19 NOTE — H&P (Addendum)
- Assessment & Plan Note     Summary: [name]  History of Present Illness  Visit Type: Follow-up Visit  Primary GI MD: [name] MD [facility]  Primary Provider: Marisue Brooklyn, DO  Requesting Provider: Marisue Brooklyn, MD  Chief Complaint: Anemia issues  History of Present Illness: [name] has returned for evaluation of anemia. This was noted on routine testing. On September 04, 2009 hemoglobin was 11.4. Serum iron was 21 and TIBC 650. Percent saturation was 3. The patient has no GI complaints including change in bowel habits, pain, melena or hematochezia. She takes Excedrin migraine tabs every other week for headaches. She has no history of anemia. In July, 2009 hemoglobin was 13.6. She has not had a menstrual period for several years.  The patient has a history of choledocholithiasis. She underwent colonoscopy in July, 2009 because of stool incontinence. No abnormalities were seen.  GI Review of Systems  Denies abdominal pain, acid reflux, belching, bloating, chest pain, dysphagia with liquids, dysphagia with solids, heartburn, loss of appetite, nausea, vomiting, vomiting blood, weight loss, and weight gain.  Denies anal fissure, black tarry stools, change in bowel habit, constipation, diarrhea, diverticulosis, fecal incontinence, heme positive stool, hemorrhoids, irritable bowel syndrome, jaundice, light color stool, liver problems, rectal bleeding, and rectal pain.  Current Medications (verified):  1) Synthroid 175 Mcg Tabs (Levothyroxine Sodium) .... Take 1 Tablet By Mouth As Directed  2) Synthroid 150 Mcg Tabs (Levothyroxine Sodium) .... Take 1 Tablet By Mouth Once Every Other Day As Directed  3) Excedrin Migraine 250-250-65 Mg Tabs (Aspirin-Acetaminophen-Caffeine) .... As Needed  4) Vicks Nyquil Multi-Symptom 15-6.25-325 Mg Caps (Dm-Doxylamine-Acetaminophen) .... As Needed  5) Diazepam 5 Mg Tabs (Diazepam) .... Take 1 Tablet By Mouth Every 8 Hours As  Needed  6) Gabapentin 300 Mg Caps (Gabapentin) .... Take 1-2 Capsules At Bedtime  7) Hydrocodone-Acetaminophen 5-500 Mg Tabs (Hydrocodone-Acetaminophen) .... Take 2 Tablets Every Four Hour As Needed For Pain  8) Savella 50 Mg Tabs (Milnacipran Hcl) .... Two Times A Day  9) Benicar 40 Mg Tabs (Olmesartan Medoxomil) .... At Bedtime  Allergies (verified):  No Known Drug Allergies  Past History:  Past Medical History:  Reviewed history from 02/12/2008 and no changes required.  Anxiety Disorder  Hypothyroidism  Kidney Stones  Past Surgical History:  Back Surgery x 8 1987-2007  Cholecystectomy  neck surgery dec 2010  Family History:  Reviewed history from 05/05/2009 and no changes required.  Family History of Colon Polyps: Father  Family History of Diabetes: Father  No FH of Colon Cancer:  Social History:  Reviewed history from 02/12/2008 and no changes required.  Divorced, Retired  Patient currently smokes. Using Chantix to quit  Alcohol Use - no  Daily Caffeine Use 1 soda daily  Review of Systems  The patient complains of anemia, anxiety-new, arthritis/joint pain, back pain, depression-new, fatigue, headaches-new, itching, night sweats, sleeping problems, and sore throat.  Vital Signs:  Patient profile: 55 year old female  Height: 66 inches  Weight: 187.38 pounds  BMI: 30.35  Pulse rate: 100 / minute  Pulse rhythm: regular  BP sitting: 152 / 102 (left arm)  Cuff size: regular  Vitals Entered By: June McMurray CMA Duncan Dull) (September 10, 2009 11:33 AM)  Physical Exam  Additional Exam: She is a healthy-appearing female  skin: anicteric  HEENT: normocephalic; PEERLA; no nasal or pharyngeal abnormalities  neck: supple  nodes: no cervical lymphadenopathy  chest: clear to ausculatation and percussion  heart: no murmurs, gallops, or rubs  abd: soft, nontender; BS normoactive; no abdominal masses, tenderness, organomegaly  rectal: deferred  ext: no cynanosis, clubbing, edema    skeletal: no deformities  neuro: oriented x 3; no focal abnormalities  Impression & Recommendations:  Problem # 1: IRON DEFICIENCY (ICD-280.9)  Assessment New  Anemia maybe related to intermittent NSAID use. A colonic source is unlikely in the face of a relatively recent colonoscopy. Active peptic disease should be ruled out.  Recommendations  #1 upper endoscopy  #2 serial Hemoccults  Risks, alternatives, and complications of the procedure, including bleeding, perforation, and possible need for surgery, were explained to the patient. Patient's questions were answered.  Orders:  EGD (EGD)  Patient Instructions:  1) Conscious Sedation brochure given.  2) Upper Endoscopy brochure given.  3) Your EGD is scheduled for 09/11/2009 at 10:30am  4) cc Katie Elisabeth Most, DO  5) The medication list was reviewed and reconciled. All changed / newly prescribed medications were explained. A complete medication list was provided to the patient / caregiver.      Over the past few months the patient has developed fatigue pruritus and vague upper abdominal pain. Recent set of LFTs were abnormal which prompted a CT scan, which are reviewed, which demonstrated marked dilatation of the biliary system.  ERCP is scheduled because of the concern for a retained or recurrent bile duct stone.  On physical exam she is well-developed white female chest is clear no cardiac murmurs gallops or rubs abdomen is without masses tenderness or organomegaly  The risks, benefits, and possible complications of the procedure, including bleeding, perforation, surgery, and the 5-10% risk for pancreatitis, were explained to the patient.  Patient's questions were answered.

## 2011-07-19 NOTE — Assessment & Plan Note (Addendum)
CTscan shows marked intrahepatic biliary dilatation, progressive since July 2011 scan.The CBD measures 17 mm in the porta hepatis and 15 mm in the head of the pancreas. No pancreatic masses seen. Patient tells me her LFTs have been elevated. She describes vague upper abdominal discomfort. Unfortunately I do not have any of her labs from PCP office at this time. Rule out recurrent CBD stones. Patient is already schedule for ERCP with MAC with Dr. Arlyce Dice at 1:30pm today. I have requested recent lab results from PCP office. Before her procedure I think we should obtain a protime, I will request that be done stat.

## 2011-07-20 ENCOUNTER — Encounter (HOSPITAL_COMMUNITY): Payer: Self-pay | Admitting: Gastroenterology

## 2011-07-20 ENCOUNTER — Telehealth: Payer: Self-pay | Admitting: Gastroenterology

## 2011-07-20 NOTE — Progress Notes (Signed)
i agree with the plan outlined in this note 

## 2011-07-20 NOTE — Telephone Encounter (Signed)
Attempted to return phone call but keep getting busy signal.

## 2011-07-21 NOTE — Telephone Encounter (Signed)
Message left for pt to call back.

## 2011-07-22 NOTE — Telephone Encounter (Signed)
Message left for pt to call back.

## 2011-07-25 ENCOUNTER — Other Ambulatory Visit: Payer: Self-pay | Admitting: Internal Medicine

## 2011-07-25 ENCOUNTER — Other Ambulatory Visit (HOSPITAL_COMMUNITY): Payer: Self-pay | Admitting: Internal Medicine

## 2011-07-25 ENCOUNTER — Ambulatory Visit (HOSPITAL_COMMUNITY)
Admission: RE | Admit: 2011-07-25 | Discharge: 2011-07-25 | Disposition: A | Discharge status: Home / Self Care | Payer: Medicare Other | Source: Ambulatory Visit | Attending: Internal Medicine | Admitting: Internal Medicine

## 2011-07-25 DIAGNOSIS — M949 Disorder of cartilage, unspecified: Secondary | ICD-10-CM | POA: Insufficient documentation

## 2011-07-25 DIAGNOSIS — M25569 Pain in unspecified knee: Secondary | ICD-10-CM | POA: Insufficient documentation

## 2011-07-25 DIAGNOSIS — M79609 Pain in unspecified limb: Secondary | ICD-10-CM

## 2011-07-25 DIAGNOSIS — M545 Low back pain, unspecified: Secondary | ICD-10-CM | POA: Insufficient documentation

## 2011-07-25 DIAGNOSIS — M899 Disorder of bone, unspecified: Secondary | ICD-10-CM | POA: Insufficient documentation

## 2011-07-25 DIAGNOSIS — M7989 Other specified soft tissue disorders: Secondary | ICD-10-CM

## 2011-07-25 DIAGNOSIS — M549 Dorsalgia, unspecified: Secondary | ICD-10-CM

## 2011-07-26 ENCOUNTER — Ambulatory Visit
Admission: RE | Admit: 2011-07-26 | Discharge: 2011-07-26 | Disposition: A | Discharge status: Home / Self Care | Payer: Medicare Other | Source: Ambulatory Visit | Attending: Internal Medicine | Admitting: Internal Medicine

## 2011-07-26 DIAGNOSIS — M7989 Other specified soft tissue disorders: Secondary | ICD-10-CM

## 2011-07-29 ENCOUNTER — Other Ambulatory Visit (INDEPENDENT_AMBULATORY_CARE_PROVIDER_SITE_OTHER): Payer: Medicare Other

## 2011-07-29 ENCOUNTER — Other Ambulatory Visit: Payer: Self-pay | Admitting: Gastroenterology

## 2011-07-29 DIAGNOSIS — R7989 Other specified abnormal findings of blood chemistry: Secondary | ICD-10-CM

## 2011-07-29 LAB — HEPATIC FUNCTION PANEL
ALT: 14 U/L (ref 0–35)
AST: 20 U/L (ref 0–37)
Albumin: 3.5 g/dL (ref 3.5–5.2)
Alkaline Phosphatase: 165 U/L — ABNORMAL HIGH (ref 39–117)
Bilirubin, Direct: 0.1 mg/dL (ref 0.0–0.3)
Total Protein: 6.5 g/dL (ref 6.0–8.3)

## 2011-07-29 NOTE — Telephone Encounter (Signed)
Pt came in today for LFT's.

## 2011-09-05 ENCOUNTER — Other Ambulatory Visit: Payer: Self-pay | Admitting: Internal Medicine

## 2011-09-05 DIAGNOSIS — M545 Low back pain, unspecified: Secondary | ICD-10-CM

## 2011-09-10 ENCOUNTER — Ambulatory Visit
Admission: RE | Admit: 2011-09-10 | Discharge: 2011-09-10 | Disposition: A | Discharge status: Home / Self Care | Payer: Medicare Other | Source: Ambulatory Visit | Attending: Internal Medicine | Admitting: Internal Medicine

## 2011-09-10 DIAGNOSIS — M545 Low back pain, unspecified: Secondary | ICD-10-CM

## 2011-09-10 MED ORDER — GADOBENATE DIMEGLUMINE 529 MG/ML IV SOLN
20.0000 mL | Freq: Once | INTRAVENOUS | Status: AC | PRN
  Administered 2011-09-10: 20 mL via INTRAVENOUS

## 2011-09-13 ENCOUNTER — Ambulatory Visit (INDEPENDENT_AMBULATORY_CARE_PROVIDER_SITE_OTHER): Payer: Medicare Other | Admitting: Gastroenterology

## 2011-09-13 ENCOUNTER — Encounter: Payer: Self-pay | Admitting: Gastroenterology

## 2011-09-13 VITALS — BP 140/70 | HR 70 | Ht 66.0 in | Wt 202.8 lb

## 2011-09-13 DIAGNOSIS — K831 Obstruction of bile duct: Secondary | ICD-10-CM

## 2011-09-13 DIAGNOSIS — D649 Anemia, unspecified: Secondary | ICD-10-CM

## 2011-09-13 DIAGNOSIS — D509 Iron deficiency anemia, unspecified: Secondary | ICD-10-CM

## 2011-09-13 DIAGNOSIS — Z1211 Encounter for screening for malignant neoplasm of colon: Secondary | ICD-10-CM

## 2011-09-13 MED ORDER — FERROUS SULFATE DRIED 200 (65 FE) MG PO TABS: ORAL_TABLET | ORAL | Status: DC

## 2011-09-13 NOTE — Assessment & Plan Note (Addendum)
At last ERCP no stones were seen. The duct was swept multiple times. Since her last exam her LFTs have normalized.

## 2011-09-13 NOTE — Progress Notes (Signed)
History of Present Illness:  [name] has returned for evaluation of anemia. In January, 2013 hemoglobin was 9.9. MCV was 77 and serum iron 15. In November, 2010 hemoglobin was 11.5. She has a history of an iron deficiency anemia. Last colonoscopy in 2009 was normal. She's on no gastric irritants including nonsteroidals. She has no history of melena or hematochezia.    Review of Systems: She has a back and joint pains. Pertinent positive and negative review of systems were noted in the above HPI section. All other review of systems were otherwise negative.    Current Medications, Allergies, Past Medical History, Past Surgical History, Family History and Social History were reviewed in [employer] electronic medical record  Vital signs were reviewed in today's medical record. Physical Exam: General: Well developed , well nourished, no acute distress Head: Normocephalic and atraumatic Eyes:  sclerae anicteric, EOMI Ears: Normal auditory acuity Mouth: No deformity or lesions Lungs: Clear throughout to auscultation Heart: Regular rate and rhythm; no murmurs, rubs or bruits Abdomen: Soft, non tender and non distended. No masses, hepatosplenomegaly or hernias noted. Normal Bowel sounds Rectal:deferred Musculoskeletal: Symmetrical with no gross deformities  Pulses:  Normal pulses noted Extremities: No clubbing, cyanosis, edema or deformities noted Neurological: Alert oriented x 4, grossly nonfocal Psychological:  Alert and cooperative. Normal mood and affect

## 2011-09-13 NOTE — Assessment & Plan Note (Signed)
This apparently has been a recurrent problem of unclear etiology. There is no evidence for overt GI blood loss.  Recommendations #1 serial Hemoccults #2 iron supplementation

## 2011-09-13 NOTE — Patient Instructions (Addendum)
You have been given a separate informational sheet regarding your tobacco use, the importance of quitting and local resources to help you quit. You will have a CBC in 1 month around March 5th. Please come to our basement to have those labs drawn You will need to follow up in 1 month

## 2011-09-15 ENCOUNTER — Other Ambulatory Visit: Payer: Medicare Other

## 2011-09-16 ENCOUNTER — Telehealth: Payer: Self-pay

## 2011-09-16 NOTE — Telephone Encounter (Signed)
Message copied by Michele Mcalpine on Fri Sep 16, 2011  3:49 PM ------      Message from: [name] D      Created: Thu Sep 15, 2011  3:35 PM       Heme positive stool - needs repeat colo

## 2011-09-16 NOTE — Telephone Encounter (Signed)
Pt scheduled for previsit 09/19/11@4 :30pm, colon scheduled with Dr. Arlyce Dice 09/23/11@3pm . Pt aware of appt dates and times.

## 2011-09-23 ENCOUNTER — Encounter: Payer: Medicare Other | Admitting: Gastroenterology

## 2011-09-28 ENCOUNTER — Ambulatory Visit (AMBULATORY_SURGERY_CENTER): Payer: Medicare Other | Admitting: *Deleted

## 2011-09-28 VITALS — Ht 66.0 in | Wt 203.7 lb

## 2011-09-28 DIAGNOSIS — Z1211 Encounter for screening for malignant neoplasm of colon: Secondary | ICD-10-CM

## 2011-09-28 MED ORDER — MOVIPREP 100 G PO SOLR: ORAL | Status: DC

## 2011-10-04 ENCOUNTER — Encounter: Payer: Self-pay | Admitting: Gastroenterology

## 2011-10-04 ENCOUNTER — Ambulatory Visit (AMBULATORY_SURGERY_CENTER): Payer: Medicare Other | Admitting: Gastroenterology

## 2011-10-04 ENCOUNTER — Telehealth: Payer: Self-pay | Admitting: Internal Medicine

## 2011-10-04 VITALS — BP 142/82 | HR 60 | Temp 98.5°F | Resp 12 | Ht 66.0 in | Wt 203.0 lb

## 2011-10-04 DIAGNOSIS — D649 Anemia, unspecified: Secondary | ICD-10-CM

## 2011-10-04 DIAGNOSIS — R195 Other fecal abnormalities: Secondary | ICD-10-CM

## 2011-10-04 MED ORDER — SODIUM CHLORIDE 0.9 % IV SOLN: 500.0000 mL | INTRAVENOUS | Status: DC

## 2011-10-04 NOTE — Telephone Encounter (Signed)
Pt's daughter called reporting small amount of rectal bleeding, had colon by Dr Arlyce Dice, no polyps or biopsies. I advised preparation H, call if more bleeding

## 2011-10-04 NOTE — Op Note (Signed)
[facility] [address] [place], [place]  [address]  COLONOSCOPY PROCEDURE REPORT  PATIENT:  Katie Gaines, Katie Gaines  MR#:  [id number] BIRTHDATE:  20-May-1956, 55 yrs. old  GENDER:  female ENDOSCOPIST:  Barbette Hair. Arlyce Dice, MD REF. BY:  Marisue Brooklyn, D.O. PROCEDURE DATE:  10/04/2011 PROCEDURE:  Diagnostic Colonoscopy ASA CLASS:  Class II INDICATIONS:  Iron deficiency anemia MEDICATIONS:   MAC sedation, administered by CRNA propofol 220mg IV  DESCRIPTION OF PROCEDURE:   After the risks benefits and alternatives of the procedure were thoroughly explained, informed consent was obtained.  Digital rectal exam was performed and revealed no abnormalities.   The [id number] endoscope was introduced through the anus and advanced to the ileum, without limitations.  The quality of the prep was excellent, using MoviPrep.  The instrument was then slowly withdrawn as the colon was fully examined. <<PROCEDUREIMAGES>>  FINDINGS:  Scattered diverticula were found in the sigmoid colon. This was otherwise a normal examination of the colon (see image1, image2, and image4).   Retroflexed views in the rectum revealed no abnormalities.    The time to cecum =  1) 4.75  minutes. The scope was then withdrawn in  1) 8.0  minutes from the cecum and the procedure completed. COMPLICATIONS:  None ENDOSCOPIC IMPRESSION: 1) Diverticula, scattered in the sigmoid colon 2) Otherwise normal examination RECOMMENDATIONS: 1) Capsule endoscopy REPEAT EXAM:  In 10 year(s) for Colonoscopy.  ______________________________ Barbette Hair. Arlyce Dice, MD  CC:  n. eSIGNED:   Barbette Hair. Smayan Hackbart at 10/04/2011 02:59 PM  Lorel Monaco, [phone]

## 2011-10-04 NOTE — Progress Notes (Signed)
Propofol per [facility] camp crna. See scanned intra procedure report. ewm 

## 2011-10-04 NOTE — Progress Notes (Signed)
Patient did not experience any of the following events: a burn prior to discharge; a fall within the facility; wrong site/side/patient/procedure/implant event; or a hospital transfer or hospital admission upon discharge from the facility. (G8907) Patient did not have preoperative order for IV antibiotic SSI prophylaxis. (G8918)  

## 2011-10-04 NOTE — Patient Instructions (Signed)

## 2011-10-05 ENCOUNTER — Telehealth: Payer: Self-pay | Admitting: *Deleted

## 2011-10-05 NOTE — Telephone Encounter (Signed)
  Follow up Call-  Call back number 10/04/2011  Post procedure Call Back phone  # [phone]  Permission to leave phone message Yes     Patient questions:  Message left for patient to call us if necessary.

## 2011-10-07 ENCOUNTER — Telehealth: Payer: Self-pay

## 2011-10-07 NOTE — Telephone Encounter (Signed)
Pt scheduled for capsule teaching 10/13/11@2pm , capsule scheduled for 10/18/11@8am , follow-up visit with Dr. Arlyce Dice scheduled for 10/27/11@3pm . Pt aware of all appt dates and times.

## 2011-10-07 NOTE — Telephone Encounter (Signed)
Pt needs to be scheduled for a capsule endo per Dr. Arlyce Dice. Left message for pt to call back to schedule the appt.

## 2011-10-11 ENCOUNTER — Other Ambulatory Visit: Payer: Self-pay | Admitting: Gastroenterology

## 2011-10-11 DIAGNOSIS — D649 Anemia, unspecified: Secondary | ICD-10-CM

## 2011-10-18 ENCOUNTER — Ambulatory Visit: Payer: Medicare Other | Admitting: Gastroenterology

## 2011-10-18 ENCOUNTER — Ambulatory Visit (INDEPENDENT_AMBULATORY_CARE_PROVIDER_SITE_OTHER): Payer: Medicare Other | Admitting: Gastroenterology

## 2011-10-18 DIAGNOSIS — K921 Melena: Secondary | ICD-10-CM

## 2011-10-18 NOTE — Progress Notes (Signed)
Patient here for capsule endoscopy. Pt prepped per instructions. Pt tolerated procedure well. Capsule 2012-31/[id number]                                                                                                                                                                      25

## 2011-10-24 ENCOUNTER — Other Ambulatory Visit (INDEPENDENT_AMBULATORY_CARE_PROVIDER_SITE_OTHER): Payer: Medicare Other

## 2011-10-24 ENCOUNTER — Telehealth: Payer: Self-pay | Admitting: Gastroenterology

## 2011-10-24 DIAGNOSIS — D649 Anemia, unspecified: Secondary | ICD-10-CM

## 2011-10-24 LAB — CBC WITH DIFFERENTIAL/PLATELET
Basophils Relative: 0.8 % (ref 0.0–3.0)
Eosinophils Relative: 3.4 % (ref 0.0–5.0)
HCT: 32 % — ABNORMAL LOW (ref 36.0–46.0)
Hemoglobin: 10 g/dL — ABNORMAL LOW (ref 12.0–15.0)
Lymphs Abs: 2.2 10*3/uL (ref 0.7–4.0)
MCV: 69.5 fl — ABNORMAL LOW (ref 78.0–100.0)
Monocytes Relative: 6.8 % (ref 3.0–12.0)
Neutro Abs: 5.1 10*3/uL (ref 1.4–7.7)
RBC: 4.61 Mil/uL (ref 3.87–5.11)
WBC: 8.2 10*3/uL (ref 4.5–10.5)

## 2011-10-24 NOTE — Telephone Encounter (Signed)
Pt states she has been anemic for 3 months and she states she is starting to feel different, weird. Offered for pt to see the PA today but she states she has an appt with Dr. Arlyce Dice Thursday. Pt has orders in the computer for a CBC prior to office visit. Pt will come in today for cbc, appt with Dr. Arlyce Dice scheduled for 10/27/11. Let pt know we would check her CBC and let her know how her Hgb is doing. Dr. Arlyce Dice aware.

## 2011-10-24 NOTE — Telephone Encounter (Signed)
ok 

## 2011-10-25 ENCOUNTER — Telehealth: Payer: Self-pay

## 2011-10-25 NOTE — Telephone Encounter (Signed)
Message copied by Chrystie Nose on Tue Oct 25, 2011  9:57 AM ------      Message from: [name] D      Created: Tue Oct 25, 2011  9:42 AM       Bonita Quin,      Please inform Mrs. Fike then the capsule studies shows some blood in the duodenum and that I would like to schedule her for a small bowel enteroscopy. This can be done in the [facility].

## 2011-10-25 NOTE — Telephone Encounter (Signed)
Spoke with pt and she is aware. Pt has F/U appt with Dr. Arlyce Dice 10/27/11 and will discuss further and schedule appt for small bowel entereoscopy at that time.

## 2011-10-26 ENCOUNTER — Telehealth: Payer: Self-pay | Admitting: Gastroenterology

## 2011-10-26 NOTE — Telephone Encounter (Signed)
Pt states that he BP dropped last night and that she is feeling more tired. Reviewed pts med list and there is not a bp med listed there. Pt states that her PCP started her on a bp med 2 weeks ago. Let pt know she should call her PCP because bp meds can make you feel tired and they may want to adjust her medication if her bp dropped last night. Pt verbalized understanding.

## 2011-10-27 ENCOUNTER — Encounter: Payer: Self-pay | Admitting: Gastroenterology

## 2011-10-27 ENCOUNTER — Ambulatory Visit (INDEPENDENT_AMBULATORY_CARE_PROVIDER_SITE_OTHER): Payer: Medicare Other | Admitting: Gastroenterology

## 2011-10-27 VITALS — BP 116/74 | HR 76 | Ht 66.0 in | Wt 199.0 lb

## 2011-10-27 DIAGNOSIS — D509 Iron deficiency anemia, unspecified: Secondary | ICD-10-CM

## 2011-10-27 NOTE — Progress Notes (Signed)
History of Present Illness:  [name] has returned for followup of iron deficiency anemia. She is Hemoccult positive. Colonoscopy was unrevealing. She underwent a capsule study that demonstrated blood in the duodenum and at 2 hours in the small bowel. A few erosions and possibly atrophic mucosa were seen. Endoscopy in February, 2011 was normal. Her main complaint is fatigue. She is on iron. Last hemoglobin was 10.2    Review of Systems: Pertinent positive and negative review of systems were noted in the above HPI section. All other review of systems were otherwise negative.    Current Medications, Allergies, Past Medical History, Past Surgical History, Family History and Social History were reviewed in [employer] [facility] electronic medical record  Vital signs were reviewed in today's medical record. Physical Exam: General: Well developed , well nourished, no acute distress

## 2011-10-27 NOTE — Patient Instructions (Signed)
Your Small bowel enteroscopy is scheduled at [facility] on 11/10/2011 Separate instructions have been given

## 2011-10-27 NOTE — Assessment & Plan Note (Signed)
Capsule study was positive for blood in the small bowel and duodenum. Endoscopy 2 years ago was normal. Exact etiology is not yet certain.  Recommendations #1 small bowel enteroscopy. If this is not diagnostic I will refer her for double balloon enteroscopy.

## 2011-11-01 ENCOUNTER — Telehealth: Payer: Self-pay | Admitting: Gastroenterology

## 2011-11-01 NOTE — Telephone Encounter (Signed)
Pt called wanting to know if there had been a cancellation at all for this week for an appt. Let pt know there has not been a cancellation. Pt will keep her appt for 11/10/11.

## 2011-11-10 ENCOUNTER — Encounter (HOSPITAL_COMMUNITY)
Admission: RE | Disposition: A | Discharge status: Home / Self Care | Payer: Self-pay | Source: Ambulatory Visit | Attending: Gastroenterology

## 2011-11-10 ENCOUNTER — Telehealth: Payer: Self-pay

## 2011-11-10 ENCOUNTER — Encounter (HOSPITAL_COMMUNITY): Payer: Self-pay | Admitting: Anesthesiology

## 2011-11-10 ENCOUNTER — Ambulatory Visit (HOSPITAL_COMMUNITY): Payer: Medicare Other | Admitting: Anesthesiology

## 2011-11-10 ENCOUNTER — Ambulatory Visit (HOSPITAL_COMMUNITY)
Admission: RE | Admit: 2011-11-10 | Discharge: 2011-11-10 | Disposition: A | Discharge status: Home / Self Care | Payer: Medicare Other | Source: Ambulatory Visit | Attending: Gastroenterology | Admitting: Gastroenterology

## 2011-11-10 ENCOUNTER — Encounter (HOSPITAL_COMMUNITY): Payer: Self-pay | Admitting: *Deleted

## 2011-11-10 DIAGNOSIS — K319 Disease of stomach and duodenum, unspecified: Secondary | ICD-10-CM | POA: Insufficient documentation

## 2011-11-10 DIAGNOSIS — D509 Iron deficiency anemia, unspecified: Secondary | ICD-10-CM | POA: Insufficient documentation

## 2011-11-10 HISTORY — PX: ENTEROSCOPY: SHX5533

## 2011-11-10 HISTORY — PX: HOT HEMOSTASIS: SHX5433

## 2011-11-10 SURGERY — ENTEROSCOPY
Anesthesia: Monitor Anesthesia Care

## 2011-11-10 MED ORDER — MIDAZOLAM HCL 5 MG/5ML IJ SOLN
INTRAMUSCULAR | Status: DC | PRN
  Administered 2011-11-10 (×2): 2 mg via INTRAVENOUS

## 2011-11-10 MED ORDER — PROPOFOL 10 MG/ML IV EMUL
INTRAVENOUS | Status: DC | PRN
  Administered 2011-11-10: 50 mg via INTRAVENOUS

## 2011-11-10 MED ORDER — PROPOFOL 10 MG/ML IV EMUL
INTRAVENOUS | Status: DC | PRN
  Administered 2011-11-10: 100 ug/kg/min via INTRAVENOUS

## 2011-11-10 MED ORDER — LACTATED RINGERS IV SOLN
INTRAVENOUS | Status: DC
  Administered 2011-11-10: 1000 mL via INTRAVENOUS

## 2011-11-10 MED ORDER — RANITIDINE HCL 150 MG PO TABS: 150.0000 mg | ORAL_TABLET | Freq: Two times a day (BID) | ORAL | Status: DC

## 2011-11-10 NOTE — Telephone Encounter (Signed)
Pts records faxed to Dr. Gwinda Passe at [phone]. He is to review the records for an appt. Waiting to hear back regarding appt.

## 2011-11-10 NOTE — Addendum Note (Signed)
Addendum  created 11/10/11 1248 by Amyjo Mizrachi E Dexton Zwilling, CRNA   Modules edited:Anesthesia Flowsheet, Anesthesia Medication Administration    

## 2011-11-10 NOTE — Addendum Note (Signed)
Addendum  created 11/10/11 1248 by Illene Silver, CRNA   Modules edited:Anesthesia Flowsheet, Anesthesia Medication Administration

## 2011-11-10 NOTE — Telephone Encounter (Signed)
Message copied by Chrystie Nose on Thu Nov 10, 2011  2:17 PM ------      Message from: [name] D      Created: Thu Nov 10, 2011 12:04 PM       Bonita Quin,      Please schedule double balloon enteroscopy at [facility] with Dr. Achilles Dunk.      Diagnoses-iron deficiency anemia, Hemoccult-positive stool. Negative endoscopy and colonoscopy and enteroscopy. Capsule study showed blood in the small intestine at 2 hours and some erosions in the duodenum.

## 2011-11-10 NOTE — Anesthesia Preprocedure Evaluation (Addendum)
Anesthesia Evaluation  Patient identified by MRN, date of birth, ID band Patient awake    Reviewed: Allergy & Precautions, H&P , NPO status , Patient's Chart, lab work & pertinent test results  Airway Mallampati: II TM Distance: >3 FB Neck ROM: Full    Dental No notable dental hx.    Pulmonary Current Smoker,  breath sounds clear to auscultation  Pulmonary exam normal       Cardiovascular negative cardio ROS  Rhythm:Regular Rate:Normal     Neuro/Psych PSYCHIATRIC DISORDERS Anxiety negative neurological ROS     GI/Hepatic negative GI ROS, Neg liver ROS,   Endo/Other  Hypothyroidism   Renal/GU negative Renal ROS  negative genitourinary   Musculoskeletal negative musculoskeletal ROS (+)   Abdominal (+) + obese,   Peds negative pediatric ROS (+)  Hematology negative hematology ROS (+)   Anesthesia Other Findings   Reproductive/Obstetrics negative OB ROS                           Anesthesia Physical Anesthesia Plan  ASA: II  Anesthesia Plan: MAC   Post-op Pain Management:    Induction: Intravenous  Airway Management Planned:   Additional Equipment:   Intra-op Plan:   Post-operative Plan: Extubation in OR  Informed Consent: I have reviewed the patients History and Physical, chart, labs and discussed the procedure including the risks, benefits and alternatives for the proposed anesthesia with the patient or authorized representative who has indicated his/her understanding and acceptance.   Dental advisory given  Plan Discussed with: CRNA  Anesthesia Plan Comments:         Anesthesia Quick Evaluation

## 2011-11-10 NOTE — H&P (View-Only) (Signed)
History of Present Illness:  [name] has returned for followup of iron deficiency anemia. She is Hemoccult positive. Colonoscopy was unrevealing. She underwent a capsule study that demonstrated blood in the duodenum and at 2 hours in the small bowel. A few erosions and possibly atrophic mucosa were seen. Endoscopy in February, 2011 was normal. Her main complaint is fatigue. She is on iron. Last hemoglobin was 10.2    Review of Systems: Pertinent positive and negative review of systems were noted in the above HPI section. All other review of systems were otherwise negative.    Current Medications, Allergies, Past Medical History, Past Surgical History, Family History and Social History were reviewed in [employer] electronic medical record  Vital signs were reviewed in today's medical record. Physical Exam: General: Well developed , well nourished, no acute distress

## 2011-11-10 NOTE — Op Note (Signed)
[facility] [address] [place], [place]  [address]  OPERATIVE PROCEDURE REPORT  PATIENT:  Katie Gaines, Katie Gaines  MR#:  [id number] BIRTHDATE:  Aug 27, 1955, 56 yrs. old  GENDER:  female  ENDOSCOPIST:  Barbette Hair. Arlyce Dice, MD ASSISTANT:  Rolanda Lundborg and Claudie Revering, RN CGRN  PROCEDURE DATE:  11/10/2011 PROCEDURE:  Small Bowel Endoscopy ASA CLASS:  Class II INDICATIONS:  1) iron deficiency anemia capsule study showed erosions in duodenum and blood at 2 hours in the small bowel  MEDICATIONS:   MAC sedation, administered by CRNA TOPICAL ANESTHETIC:  DESCRIPTION OF PROCEDURE:   After the risks benefits and alternatives of the procedure were thoroughly explained, informed consent was obtained.  The EC-[id number] ([id number]) endoscope was introduced through the mouth and advanced to the proximal jejunum, without limitations.  The instrument was slowly withdrawn as the mucosa was fully examined. <<PROCEDUREIMAGES>>  Multiple erosions were found in the antrum (see image1 and image3).  otherwise normal exam (see image4, image5, image2, and image7).    Retroflexed views revealed no abnormalities.    The scope was then withdrawn from the patient and the procedure terminated.  COMPLICATIONS:  None  ENDOSCOPIC IMPRESSION: 1) Erosions, multiple in the antrum 2) Otherwise normal exam RECOMMENDATIONS: 1) Zantac 150 mg bid 2) double balloon enteroscopy  REPEAT EXAM:  No  ______________________________ Barbette Hair. Arlyce Dice, MD  CC:  n. eSIGNED:   Barbette Hair. Kaplan at 11/10/2011 12:02 PM  Lorel Monaco, [phone]

## 2011-11-10 NOTE — Anesthesia Postprocedure Evaluation (Signed)
  Anesthesia Post-op Note  Patient: Katie Gaines  Procedure(s) Performed: Procedure(s) (LRB): ENTEROSCOPY (N/A) HOT HEMOSTASIS (ARGON PLASMA COAGULATION/BICAP) (N/A)  Patient Location: PACU  Anesthesia Type: MAC  Level of Consciousness: awake and alert   Airway and Oxygen Therapy: Patient Spontanous Breathing  Post-op Pain: mild  Post-op Assessment: Post-op Vital signs reviewed, Patient's Cardiovascular Status Stable, Respiratory Function Stable, Patent Airway and No signs of Nausea or vomiting  Post-op Vital Signs: stable  Complications: No apparent anesthesia complications

## 2011-11-10 NOTE — Transfer of Care (Signed)
Immediate Anesthesia Transfer of Care Note  Patient: Katie Gaines  Procedure(s) Performed: Procedure(s) (LRB): ENTEROSCOPY (N/A) HOT HEMOSTASIS (ARGON PLASMA COAGULATION/BICAP) (N/A)  Patient Location: PACU  Anesthesia Type: MAC  Level of Consciousness: awake, alert , oriented and patient cooperative  Airway & Oxygen Therapy: Patient Spontanous Breathing and Patient connected to nasal cannula oxygen  Post-op Assessment: Report given to PACU RN, Post -op Vital signs reviewed and stable and Patient moving all extremities X 4  Post vital signs: stable  Complications: No apparent anesthesia complications 

## 2011-11-10 NOTE — Transfer of Care (Deleted)
Immediate Anesthesia Transfer of Care Note  Patient: Katie Gaines  Procedure(s) Performed: Procedure(s) (LRB): ENTEROSCOPY (N/A) HOT HEMOSTASIS (ARGON PLASMA COAGULATION/BICAP) (N/A)  Patient Location: PACU  Anesthesia Type: MAC  Level of Consciousness: awake, alert , oriented and patient cooperative  Airway & Oxygen Therapy: Patient Spontanous Breathing and Patient connected to nasal cannula oxygen  Post-op Assessment: Report given to PACU RN, Post -op Vital signs reviewed and stable and Patient moving all extremities X 4  Post vital signs: stable  Complications: No apparent anesthesia complications

## 2011-11-10 NOTE — Interval H&P Note (Signed)
History and Physical Interval Note:  11/10/2011 11:34 AM  Katie Gaines  has presented today for surgery, with the diagnosis of small bowel bleeding  The various methods of treatment have been discussed with the patient and family. After consideration of risks, benefits and other options for treatment, the patient has consented to  Procedure(s) (LRB): ENTEROSCOPY (N/A) HOT HEMOSTASIS (ARGON PLASMA COAGULATION/BICAP) (N/A) as a surgical intervention .  The patients' history has been reviewed, patient examined, no change in status, stable for surgery.  I have reviewed the patients' chart and labs.  Questions were answered to the patient's satisfaction.    The recent H&P (dated *10/27/11**) was reviewed, the patient was examined and there is no change in the patients condition since that H&P was completed.   [name]  11/10/2011, 11:34 AM    [name]

## 2011-11-10 NOTE — Discharge Instructions (Signed)
My office will schedule double balloon enteroscopy at [facility]

## 2011-11-11 ENCOUNTER — Encounter (HOSPITAL_COMMUNITY): Payer: Self-pay | Admitting: Gastroenterology

## 2011-11-15 NOTE — Telephone Encounter (Signed)
Called Dr. Sander Nephew office to check on appt. Their office requesting Capsule endo be sent to them on disk. Disk mailed to Dr. Gwinda Passe.

## 2011-11-17 NOTE — Telephone Encounter (Signed)
Spoke with pt and she is aware that her referral has been made and her records have been sent. Let pt know she should receive a call from Dr. Sander Nephew office for an appt.

## 2011-12-02 ENCOUNTER — Telehealth: Payer: Self-pay | Admitting: Gastroenterology

## 2011-12-02 DIAGNOSIS — D649 Anemia, unspecified: Secondary | ICD-10-CM

## 2011-12-02 DIAGNOSIS — E039 Hypothyroidism, unspecified: Secondary | ICD-10-CM

## 2011-12-05 NOTE — Telephone Encounter (Signed)
Left message for pt to call back  °

## 2011-12-06 NOTE — Telephone Encounter (Signed)
Pt states she has an appt at [facility] May 28th. States she is feeling more tired and is requesting to have her CBC and her Thyroid checked. Pt states she does not have a PCP at this time. Dr. Arlyce Dice please advise.

## 2011-12-07 NOTE — Telephone Encounter (Signed)
Ok

## 2011-12-07 NOTE — Telephone Encounter (Signed)
Pt aware and orders entered. 

## 2011-12-12 ENCOUNTER — Other Ambulatory Visit (INDEPENDENT_AMBULATORY_CARE_PROVIDER_SITE_OTHER): Payer: Medicare Other

## 2011-12-12 DIAGNOSIS — D649 Anemia, unspecified: Secondary | ICD-10-CM

## 2011-12-12 LAB — CBC WITH DIFFERENTIAL/PLATELET
Basophils Absolute: 0.1 10*3/uL (ref 0.0–0.1)
Eosinophils Absolute: 0.2 10*3/uL (ref 0.0–0.7)
Lymphocytes Relative: 29.8 % (ref 12.0–46.0)
MCHC: 31.4 g/dL (ref 30.0–36.0)
MCV: 67.5 fl — ABNORMAL LOW (ref 78.0–100.0)
Monocytes Absolute: 0.3 10*3/uL (ref 0.1–1.0)
Neutrophils Relative %: 59.7 % (ref 43.0–77.0)
Platelets: 306 10*3/uL (ref 150.0–400.0)
RBC: 4.77 Mil/uL (ref 3.87–5.11)
RDW: 18.3 % — ABNORMAL HIGH (ref 11.5–14.6)

## 2011-12-12 LAB — TSH: TSH: 1.65 u[IU]/mL (ref 0.35–5.50)

## 2011-12-15 ENCOUNTER — Telehealth: Payer: Self-pay | Admitting: Gastroenterology

## 2011-12-15 NOTE — Telephone Encounter (Signed)
Reviewed labs with pt. She has appt at [facility] coming up at the end of the month.

## 2012-01-04 DIAGNOSIS — K922 Gastrointestinal hemorrhage, unspecified: Secondary | ICD-10-CM | POA: Insufficient documentation

## 2012-01-04 DIAGNOSIS — E039 Hypothyroidism, unspecified: Secondary | ICD-10-CM | POA: Insufficient documentation

## 2012-02-13 DIAGNOSIS — I10 Essential (primary) hypertension: Secondary | ICD-10-CM | POA: Insufficient documentation

## 2012-03-04 ENCOUNTER — Encounter (HOSPITAL_COMMUNITY): Payer: Self-pay | Admitting: *Deleted

## 2012-03-04 ENCOUNTER — Emergency Department (HOSPITAL_COMMUNITY): Payer: Medicare Other

## 2012-03-04 DIAGNOSIS — R0789 Other chest pain: Secondary | ICD-10-CM | POA: Insufficient documentation

## 2012-03-04 DIAGNOSIS — E039 Hypothyroidism, unspecified: Secondary | ICD-10-CM | POA: Insufficient documentation

## 2012-03-04 DIAGNOSIS — R0602 Shortness of breath: Secondary | ICD-10-CM | POA: Insufficient documentation

## 2012-03-04 DIAGNOSIS — R110 Nausea: Secondary | ICD-10-CM | POA: Insufficient documentation

## 2012-03-04 DIAGNOSIS — R609 Edema, unspecified: Secondary | ICD-10-CM | POA: Insufficient documentation

## 2012-03-04 DIAGNOSIS — R5381 Other malaise: Secondary | ICD-10-CM | POA: Insufficient documentation

## 2012-03-04 DIAGNOSIS — R079 Chest pain, unspecified: Secondary | ICD-10-CM | POA: Insufficient documentation

## 2012-03-04 LAB — BASIC METABOLIC PANEL
Calcium: 9.4 mg/dL (ref 8.4–10.5)
GFR calc Af Amer: >90 mL/min (ref >90)
GFR calc non Af Amer: >90 mL/min (ref >90)
Potassium: 3.4 mEq/L — ABNORMAL LOW (ref 3.5–5.1)
Sodium: 140 mEq/L (ref 135–145)

## 2012-03-04 LAB — POCT I-STAT TROPONIN I: Troponin i, poc: 0 ng/mL (ref 0.00–0.08)

## 2012-03-04 LAB — CBC WITH DIFFERENTIAL/PLATELET
Basophils Relative: 1 % (ref 0–1)
Eosinophils Absolute: 0.2 10*3/uL (ref 0.0–0.7)
Eosinophils Relative: 3 % (ref 0–5)
Hemoglobin: 10.9 g/dL — ABNORMAL LOW (ref 12.0–15.0)
Lymphocytes Relative: 29 % (ref 12–46)
MCH: 22.3 pg — ABNORMAL LOW (ref 26.0–34.0)
MCHC: 30.1 g/dL (ref 30.0–36.0)
Monocytes Absolute: 0.4 10*3/uL (ref 0.1–1.0)
Neutrophils Relative %: 61 % (ref 43–77)
Platelets: 280 10*3/uL (ref 150–400)
RBC: 4.89 MIL/uL (ref 3.87–5.11)

## 2012-03-04 NOTE — ED Notes (Signed)
Patient reports she has been sick since Monday with chest pain, sob, nausea, fatigue and unstable bp.

## 2012-03-05 ENCOUNTER — Emergency Department (HOSPITAL_COMMUNITY): Payer: Medicare Other

## 2012-03-05 ENCOUNTER — Encounter (HOSPITAL_COMMUNITY): Payer: Self-pay | Admitting: Emergency Medicine

## 2012-03-05 ENCOUNTER — Emergency Department (HOSPITAL_COMMUNITY)
Admission: EM | Admit: 2012-03-05 | Discharge: 2012-03-05 | Disposition: A | Discharge status: Home / Self Care | Payer: Medicare Other | Attending: Emergency Medicine | Admitting: Emergency Medicine

## 2012-03-05 DIAGNOSIS — R5383 Other fatigue: Secondary | ICD-10-CM

## 2012-03-05 DIAGNOSIS — R0789 Other chest pain: Secondary | ICD-10-CM

## 2012-03-05 DIAGNOSIS — R110 Nausea: Secondary | ICD-10-CM

## 2012-03-05 DIAGNOSIS — R531 Weakness: Secondary | ICD-10-CM

## 2012-03-05 HISTORY — DX: Anemia, unspecified: D64.9

## 2012-03-05 LAB — HEPATIC FUNCTION PANEL
ALT: 12 U/L (ref 0–35)
AST: 19 U/L (ref 0–37)
Total Protein: 6.5 g/dL (ref 6.0–8.3)

## 2012-03-05 LAB — CARDIAC PANEL(CRET KIN+CKTOT+MB+TROPI): Troponin I: <0.3 ng/mL (ref <0.30)

## 2012-03-05 MED ORDER — ONDANSETRON HCL 4 MG PO TABS: 4.0000 mg | ORAL_TABLET | Freq: Four times a day (QID) | ORAL | Status: AC

## 2012-03-05 MED ORDER — IOHEXOL 350 MG/ML SOLN
100.0000 mL | Freq: Once | INTRAVENOUS | Status: AC | PRN
  Administered 2012-03-05: 100 mL via INTRAVENOUS

## 2012-03-05 MED ORDER — ONDANSETRON HCL 4 MG/2ML IJ SOLN
4.0000 mg | Freq: Once | INTRAMUSCULAR | Status: AC
  Administered 2012-03-05: 4 mg via INTRAVENOUS
  Filled 2012-03-05: qty 2

## 2012-03-05 MED ORDER — SODIUM CHLORIDE 0.9 % IV BOLUS (SEPSIS)
1000.0000 mL | Freq: Once | INTRAVENOUS | Status: AC
  Administered 2012-03-05: 1000 mL via INTRAVENOUS

## 2012-03-05 NOTE — ED Notes (Signed)
Pt very upset about long wait

## 2012-03-05 NOTE — ED Notes (Signed)
Under patient suicide screening I accidentally pressed the yes button, the patient is not at risk for suicide.

## 2012-03-05 NOTE — ED Provider Notes (Signed)
History     CSN: [id number]  Arrival date & time 03/04/12  6213   First MD Initiated Contact with Patient 03/05/12 0113      Chief Complaint  Patient presents with  . Chest Pain  . Nausea  . Shortness of Breath    (Consider location/radiation/quality/duration/timing/severity/associated sxs/prior treatment) HPI 56 year old female presents to emergency department with complaint of one week of fatigue, weakness, left-sided chest pain, nausea, fluctuating blood pressures. Patient reports she's been off her blood pressures for a year due to stabilization of her htn. Patient reports she has history of fluctuating blood pressures, which have been worse in the past week. She reports she can feel when her blood pressure will be high, and she checks it when she starts to feel shaky. She reports high blood pressure for her is in the 150s systolic. She reports she felt ill on Monday while out with her grandchildren at [employer], and checked her blood pressure and it was 66/40. Patient went home from the museum and laid down. Her blood pressure improved on its own.. Patient with chest tightness like a band just under her left breast without radiation. This has been ongoing for 2 days.  She has had some mild shortness of breath with this as well. Patient with chronic swelling of her left leg, lymphedema that has been unexplained.  No prior h/o dvt, pe.  Pt with h/o anemia, had EGD 2 weeks ago with avm ablation.  Pt had iron infusion Wednesday.  No worsening of symptoms today, but was not improving so wanted to get checked out.  No fevers, rash, abd pain, tick bites, sick contacts, travel.     Past Medical History  Diagnosis Date  . Hypothyroidism   . Anxiety disorder   . Kidney stones   . Lymphedema of leg     leg  . Common bile duct stone   . Iron deficiency   . Anemia     Past Surgical History  Procedure Date  . Back surgery 1987-2007    X 10 Surgery  . Cholecystectomy   .  Appendectomy   . Ercp 07/19/2011    Procedure: ENDOSCOPIC RETROGRADE CHOLANGIOPANCREATOGRAPHY (ERCP);  Surgeon: Louis Meckel, MD;  Location: [facility];  Service: Endoscopy;  Laterality: N/A;  . Enteroscopy 11/10/2011    Procedure: ENTEROSCOPY;  Surgeon: Louis Meckel, MD;  Location: [facility];  Service: Endoscopy;  Laterality: N/A;  . Hot hemostasis 11/10/2011    Procedure: HOT HEMOSTASIS (ARGON PLASMA COAGULATION/BICAP);  Surgeon: Louis Meckel, MD;  Location: [facility];  Service: Endoscopy;  Laterality: N/A;    Family History  Problem Relation Age of Onset  . Colon polyps Father   . Diabetes Father   . Colon cancer Neg Hx   . Malignant hyperthermia Neg Hx     History  Substance Use Topics  . Smoking status: Current Everyday Smoker -- 0.7 packs/day    Types: Cigarettes  . Smokeless tobacco: Never Used  . Alcohol Use: No    OB History    Grav Para Term Preterm Abortions TAB SAB Ect Mult Living                  Review of Systems  All other systems reviewed and are negative.  other than listed in HPI  Allergies  Review of patient's allergies indicates no known allergies.  Home Medications   Current Outpatient Rx  Name Route Sig Dispense Refill  . ASPIRIN-ACETAMINOPHEN-CAFFEINE 250-250-65 MG PO  TABS Oral Take 1 tablet by mouth every 6 (six) hours as needed. As needed.     Marland Kitchen BISOPROLOL-HYDROCHLOROTHIAZIDE 2.5-6.25 MG PO TABS Oral Take 1 tablet by mouth daily.    Marland Kitchen DIAZEPAM 5 MG PO TABS Oral Take 5 mg by mouth every 8 (eight) hours as needed.      Marland Kitchen FERROUS SULFATE DRIED 200 (65 FE) MG PO TABS  Take one tab twice a day 60 tablet 2  . GABAPENTIN 300 MG PO CAPS Oral Take 600 mg by mouth 3 (three) times daily. 1-2 capsules at bedtime    . LEVOTHYROXINE SODIUM 150 MCG PO TABS Oral Take 137 mcg by mouth daily. Take 1 tablet by mouth on Tuesday, Thursday, Saturday (on Monday Wednesday, Friday take 175 mcg) Sunday, do not take any synthroid.    Marland Kitchen LEVOTHYROXINE SODIUM  175 MCG PO TABS Oral Take 175 mcg by mouth. Take 1 tablet by mouth on Monday, Wednesday, Friday (take 150 mg on Tuesday, Thursday, Saturday) do not take any synthroid on Sunday     . MORPHINE SULFATE ER 60 MG PO TB12 Oral Take 60 mg by mouth 3 (three) times daily.        BP 132/102  Pulse 60  Temp 98.3 F (36.8 C) (Oral)  Resp 16  Ht 5\' 6"  (1.676 m)  Wt 200 lb (90.719 kg)  BMI 32.28 kg/m2  SpO2 99%  Physical Exam  Nursing note and vitals reviewed. Constitutional: She is oriented to person, place, and time. She appears well-developed and well-nourished.  HENT:  Head: Normocephalic and atraumatic.  Nose: Nose normal.  Mouth/Throat: Oropharynx is clear and moist.  Eyes: Conjunctivae and EOM are normal. Pupils are equal, round, and reactive to light.  Neck: Normal range of motion. Neck supple. No JVD present. No tracheal deviation present. No thyromegaly present.  Cardiovascular: Normal rate, regular rhythm, normal heart sounds and intact distal pulses.  Exam reveals no gallop and no friction rub.   No murmur heard. Pulmonary/Chest: Effort normal and breath sounds normal. No stridor. No respiratory distress. She has no wheezes. She has no rales. She exhibits no tenderness.  Abdominal: Soft. Bowel sounds are normal. She exhibits no distension and no mass. There is no tenderness. There is no rebound and no guarding.  Musculoskeletal: Normal range of motion. She exhibits edema (mild bilateral edema). She exhibits no tenderness.  Lymphadenopathy:    She has no cervical adenopathy.  Neurological: She is alert and oriented to person, place, and time. She has normal reflexes. No cranial nerve deficit. She exhibits normal muscle tone. Coordination normal.  Skin: Skin is dry. No rash noted. No erythema. No pallor.  Psychiatric: She has a normal mood and affect. Her behavior is normal. Judgment and thought content normal.    ED Course  Procedures (including critical care time)  Labs Reviewed    BASIC METABOLIC PANEL - Abnormal; Notable for the following:    Potassium 3.4 (*)     Glucose, Bld 113 (*)     All other components within normal limits  CBC WITH DIFFERENTIAL - Abnormal; Notable for the following:    Hemoglobin 10.9 (*)     MCV 74.0 (*)     MCH 22.3 (*)     RDW 17.6 (*)     All other components within normal limits  D-DIMER, QUANTITATIVE - Abnormal; Notable for the following:    D-Dimer, Quant 0.56 (*)     All other components within normal limits  HEPATIC FUNCTION  PANEL - Abnormal; Notable for the following:    Albumin 3.4 (*)     All other components within normal limits  POCT I-STAT TROPONIN I  CARDIAC PANEL(CRET KIN+CKTOT+MB+TROPI)   Dg Chest 2 View  03/04/2012  *RADIOLOGY REPORT*  Clinical Data: Chest pain.  Nausea, shortness of breath.  Recent gastrointestinal bleeding with iron deficiency anemia.  The patient had double balloon enteroscopy at [facility]  CHEST - 2 VIEW  Comparison: 03/30/2009  Findings: The heart is enlarged.  There are no focal consolidations or pleural effusions.  No edema.  Prior lower cervical fusion. Surgical clips are seen in the right upper quadrant of the abdomen. Visualized osseous structures have a normal appearance.  IMPRESSION:  1.  Cardiomegaly. 2. No evidence for acute pulmonary abnormality.  Original Report Authenticated By: Patterson Hammersmith, M.D.   Ct Angio Chest W/cm &/or Wo Cm  03/05/2012  *RADIOLOGY REPORT*  Clinical Data: Chest pain, shortness of breath.  CT ANGIOGRAPHY CHEST  Technique:  Multidetector CT imaging of the chest using the standard protocol during bolus administration of intravenous contrast. Multiplanar reconstructed images including MIPs were obtained and reviewed to evaluate the vascular anatomy.  Contrast: OMNIPAQUE IOHEXOL 350 MG/ML SOLN  Comparison: 03/04/2012 radiograph, 02/10/2010 chest CT.  07/15/2011 abdominal CT.  Findings: No pulmonary arterial branch filling defect identified.  Heart size upper  normal to mildly enlarged. Coronary artery calcification. No pericardial effusion.  Mild dilatation of the ascending aorta up to 3.9 cm.  Tapers smoothly along the arch.  Mild atherosclerotic calcification of the aorta.  No intrathoracic lymphadenopathy.  The esophagus is mildly patulous.  The central airways are patent.  Mild areas of mosaic attenuation. No focal consolidation.  No pneumothorax. No pleural effusion. Minimal apical scarring.  Limited images through the upper abdomen demonstrate intrahepatic biliary ductal prominence and pneumobilia.  Status post cholecystectomy.  No acute osseous finding.  IMPRESSION: No pulmonary arterial branch filling defect identified.  Mild pulmonary mosaic attenuation is a nonspecific pattern that can be seen with air trapping and/or atelectasis.  Mild ascending aortic dilatation up to 3.9 cm.  Tapers to a normal caliber along the arch.  Heart size upper normal to mildly enlarged, with coronary artery calcification.  Pneumobilia and intra and extrahepatic biliary ductal dilatation (though less dilated than the December 2012 comparison).  Correlate with clinical history, LFTs, and ERCP if warranted.  Original Report Authenticated By: Waneta Martins, M.D.    Date: 03/05/2012  Rate: 97  Rhythm: normal sinus rhythm and premature ventricular contractions (PVC)  QRS Axis: normal  Intervals: normal  ST/T Wave abnormalities: nonspecific T wave changes  Conduction Disutrbances:none  Narrative Interpretation:   Old EKG Reviewed: none available   1. Weakness   2. Nausea   3. Fatigue   4. Atypical chest pain       56 yo female with vague symptoms and self-reported hypotension over the last week. Slightly elevated d-dimer but negative CT angio chest. Results discussed with patient, she does not have an internist but only specialists. We'll have her followup with a local primary care Dr. for further evaluation.          Olivia Mackie, MD 03/05/12 [phone]

## 2012-04-03 ENCOUNTER — Ambulatory Visit: Payer: Medicare Other

## 2012-04-03 ENCOUNTER — Ambulatory Visit (INDEPENDENT_AMBULATORY_CARE_PROVIDER_SITE_OTHER): Payer: Medicare Other | Admitting: Family Medicine

## 2012-04-03 VITALS — BP 105/72 | HR 88 | Temp 98.8°F | Resp 17 | Ht 66.0 in | Wt 196.0 lb

## 2012-04-03 DIAGNOSIS — M79609 Pain in unspecified limb: Secondary | ICD-10-CM

## 2012-04-03 DIAGNOSIS — T148XXA Other injury of unspecified body region, initial encounter: Secondary | ICD-10-CM

## 2012-04-03 DIAGNOSIS — R52 Pain, unspecified: Secondary | ICD-10-CM

## 2012-04-03 DIAGNOSIS — M79673 Pain in unspecified foot: Secondary | ICD-10-CM

## 2012-04-03 DIAGNOSIS — M25579 Pain in unspecified ankle and joints of unspecified foot: Secondary | ICD-10-CM

## 2012-04-03 NOTE — Patient Instructions (Signed)
Ankle Pain  Ankle pain is a common symptom. The bones, cartilage, tendons, and muscles of the ankle joint perform a lot of work each day. The ankle joint holds your body weight and allows you to move around. Ankle pain can occur on either side or back of 1 or both ankles. Ankle pain may be sharp and burning or dull and aching. There may be tenderness, stiffness, redness, or warmth around the ankle. The pain occurs more often when a person walks or puts pressure on the ankle.  CAUSES   There are many reasons ankle pain can develop. It is important to work with your caregiver to identify the cause since many conditions can impact the bones, cartilage, muscles, and tendons. Causes for ankle pain include:  · Injury, including a break (fracture), sprain, or strain often due to a fall, sports, or a high-impact activity.  · Swelling (inflammation) of a tendon (tendonitis).  · Achilles tendon rupture.  · Ankle instability after repeated sprains and strains.  · Poor foot alignment.  · Pressure on a nerve (tarsal tunnel syndrome).  · Arthritis in the ankle or the lining of the ankle.  · Crystal formation in the ankle (gout or pseudogout).  DIAGNOSIS   A diagnosis is based on your medical history, your symptoms, results of your physical exam, and results of diagnostic tests. Diagnostic tests may include X-ray exams or a computerized magnetic scan (magnetic resonance imaging, MRI).  TREATMENT   Treatment will depend on the cause of your ankle pain and may include:  · Keeping pressure off the ankle and limiting activities.  · Using crutches or other walking support (a cane or brace).  · Using rest, ice, compression, and elevation.  · Participating in physical therapy or home exercises.  · Wearing shoe inserts or special shoes.  · Losing weight.  · Taking medications to reduce pain or swelling or receiving an injection.  · Undergoing surgery.  HOME CARE INSTRUCTIONS   · Only take over-the-counter or prescription medicines for  pain, discomfort, or fever as directed by your caregiver.  · Put ice on the injured area.  · Put ice in a plastic bag.  · Place a towel between your skin and the bag.  · Leave the ice on for 15 to 20 minutes at a time, 3 to 4 times a day.  · Keep your leg raised (elevated) when possible to lessen swelling.  · Avoid activities that cause ankle pain.  · Follow specific exercises as directed by your caregiver.  · Record how often you have ankle pain, the location of the pain, and what it feels like. This information may be helpful to you and your caregiver.  · Ask your caregiver about returning to work or sports and whether you should drive.  · Follow up with your caregiver for further examination, therapy, or testing as directed.  SEEK MEDICAL CARE IF:   · Pain or swelling continues or worsens beyond 1 week.  · You have an oral temperature above 102° F (38.9° C).  · You are feeling unwell or have chills.  · You are having an increasingly difficult time with walking.  · You have loss of sensation or other new symptoms.  · You have questions or concerns.  MAKE SURE YOU:   · Understand these instructions.  · Will watch your condition.  · Will get help right away if you are not doing well or get worse.  Document Released: 01/12/2010 Document Revised: 07/14/2011 Document   Reviewed: 01/12/2010  [employer]® Patient Information ©2012 [employer].

## 2012-04-03 NOTE — Progress Notes (Signed)
[facility]:  Office Visit  Chief Complaint:  Chief Complaint  Patient presents with  . Foot Injury    twisted foot pain     HPI: Katie Gaines is a 56 y.o. female who complains of  Right foot and ankle pain and feeling of numbness today. Has a h/o spinal surgeries butthis was different. NUmbness Lasted 3 min . + swelling, ankle pain. NKI.  Past Medical History  Diagnosis Date  . Hypothyroidism   . Anxiety disorder   . Kidney stones   . Lymphedema of leg     leg  . Common bile duct stone   . Iron deficiency   . Anemia    Past Surgical History  Procedure Date  . Back surgery 1987-2007    X 10 Surgery  . Cholecystectomy   . Appendectomy   . Ercp 07/19/2011    Procedure: ENDOSCOPIC RETROGRADE CHOLANGIOPANCREATOGRAPHY (ERCP);  Surgeon: Louis Meckel, MD;  Location: [facility];  Service: Endoscopy;  Laterality: N/A;  . Enteroscopy 11/10/2011    Procedure: ENTEROSCOPY;  Surgeon: Louis Meckel, MD;  Location: [facility];  Service: Endoscopy;  Laterality: N/A;  . Hot hemostasis 11/10/2011    Procedure: HOT HEMOSTASIS (ARGON PLASMA COAGULATION/BICAP);  Surgeon: Louis Meckel, MD;  Location: [facility];  Service: Endoscopy;  Laterality: N/A;   History   Social History  . Marital Status: Single    Spouse Name: N/A    Number of Children: N/A  . Years of Education: N/A   Social History Main Topics  . Smoking status: Current Everyday Smoker -- 0.7 packs/day    Types: Cigarettes  . Smokeless tobacco: Never Used  . Alcohol Use: No  . Drug Use: No  . Sexually Active: None   Other Topics Concern  . None   Social History Narrative   Caffeine: 1 soda daily   Family History  Problem Relation Age of Onset  . Colon polyps Father   . Diabetes Father   . Colon cancer Neg Hx   . Malignant hyperthermia Neg Hx    No Known Allergies Prior to Admission medications   Medication Sig Start Date End Date Taking? Authorizing Provider  diazepam (VALIUM) 5  MG tablet Take 5 mg by mouth every 8 (eight) hours as needed.     Yes Historical Provider, MD  Ferrous Sulfate Dried (FEOSOL) 200 (65 FE) MG TABS Take one tab twice a day 09/13/11  Yes Louis Meckel, MD  gabapentin (NEURONTIN) 300 MG capsule Take 600 mg by mouth 3 (three) times daily. 1-2 capsules at bedtime   Yes Historical Provider, MD  levothyroxine (SYNTHROID, LEVOTHROID) 150 MCG tablet Take 137 mcg by mouth daily. Take 1 tablet by mouth on Tuesday, Thursday, Saturday (on Monday Wednesday, Friday take 175 mcg) Sunday, do not take any synthroid.   Yes Historical Provider, MD  levothyroxine (SYNTHROID, LEVOTHROID) 175 MCG tablet Take 175 mcg by mouth. Take 1 tablet by mouth on Monday, Wednesday, Friday (take 150 mg on Tuesday, Thursday, Saturday) do not take any synthroid on Sunday    Yes Historical Provider, MD  morphine (MS CONTIN) 60 MG 12 hr tablet Take 60 mg by mouth 3 (three) times daily.     Yes Historical Provider, MD  aspirin-acetaminophen-caffeine (EXCEDRIN MIGRAINE) [phone] MG per tablet Take 1 tablet by mouth every 6 (six) hours as needed. As needed.     Historical Provider, MD  bisoprolol-hydrochlorothiazide [facility]) 2.5-6.25 MG per tablet Take 1 tablet by mouth  daily.    Historical Provider, MD     ROS: The patient denies fevers, chills, night sweats, unintentional weight loss, chest pain, palpitations, wheezing, dyspnea on exertion, nausea, vomiting, abdominal pain, dysuria, hematuria, melena, numbness, weakness, or tingling.  All other systems have been reviewed and were otherwise negative with the exception of those mentioned in the HPI and as above.    PHYSICAL EXAM: Filed Vitals:   04/03/12 2025  BP: 105/72  Pulse: 88  Temp: 98.8 F (37.1 C)  Resp: 17   Filed Vitals:   04/03/12 2025  Height: 5\' 6"  (1.676 m)  Weight: 196 lb (88.905 kg)   Body mass index is 31.64 kg/(m^2).  General: Alert, no acute distress HEENT:  Normocephalic, atraumatic, oropharynx patent.    Cardiovascular:  Regular rate and rhythm, no rubs murmurs or gallops.  No Carotid bruits, radial pulse intact. No pedal edema.  Respiratory: Clear to auscultation bilaterally.  No wheezes, rales, or rhonchi.  No cyanosis, no use of accessory musculature GI: No organomegaly, abdomen is soft and non-tender, positive bowel sounds.  No masses. Skin: No rashes. Neurologic: Facial musculature symmetric. Psychiatric: Patient is appropriate throughout our interaction. Lymphatic: No cervical lymphadenopathy Musculoskeletal: Gait intact. Right ankle pain, right foot pain, right swelling along lateral malleoi, + DP 5/5 strength, sensation intact ROm intact   LABS: Results for orders placed during the hospital encounter of 03/05/12  BASIC METABOLIC PANEL      Component Value Range   Sodium 140  135 - 145 mEq/L   Potassium 3.4 (*) 3.5 - 5.1 mEq/L   Chloride 103  96 - 112 mEq/L   CO2 26  19 - 32 mEq/L   Glucose, Bld 113 (*) 70 - 99 mg/dL   BUN 14  6 - 23 mg/dL   Creatinine, Ser 2.13  0.50 - 1.10 mg/dL   Calcium 9.4  8.4 - 08.6 mg/dL   GFR calc non Af Amer >90  >90 mL/min   GFR calc Af Amer >90  >90 mL/min  CBC WITH DIFFERENTIAL      Component Value Range   WBC 6.7  4.0 - 10.5 K/uL   RBC 4.89  3.87 - 5.11 MIL/uL   Hemoglobin 10.9 (*) 12.0 - 15.0 g/dL   HCT 57.8  46.9 - 62.9 %   MCV 74.0 (*) 78.0 - 100.0 fL   MCH 22.3 (*) 26.0 - 34.0 pg   MCHC 30.1  30.0 - 36.0 g/dL   RDW 52.8 (*) 41.3 - 24.4 %   Platelets 280  150 - 400 K/uL   Neutrophils Relative 61  43 - 77 %   Lymphocytes Relative 29  12 - 46 %   Monocytes Relative 6  3 - 12 %   Eosinophils Relative 3  0 - 5 %   Basophils Relative 1  0 - 1 %   Neutro Abs 4.1  1.7 - 7.7 K/uL   Lymphs Abs 1.9  0.7 - 4.0 K/uL   Monocytes Absolute 0.4  0.1 - 1.0 K/uL   Eosinophils Absolute 0.2  0.0 - 0.7 K/uL   Basophils Absolute 0.1  0.0 - 0.1 K/uL   RBC Morphology POLYCHROMASIA PRESENT    POCT I-STAT TROPONIN I      Component Value Range    Troponin i, poc 0.00  0.00 - 0.08 ng/mL   Comment 3           CARDIAC PANEL(CRET KIN+CKTOT+MB+TROPI)      Component Value Range  Total CK 42  7 - 177 U/L   CK, MB 1.3  0.3 - 4.0 ng/mL   Troponin I <0.30  <0.30 ng/mL   Relative Index RELATIVE INDEX IS INVALID  0.0 - 2.5  D-DIMER, QUANTITATIVE      Component Value Range   D-Dimer, Quant 0.56 (*) 0.00 - 0.48 ug/mL-FEU  HEPATIC FUNCTION PANEL      Component Value Range   Total Protein 6.5  6.0 - 8.3 g/dL   Albumin 3.4 (*) 3.5 - 5.2 g/dL   AST 19  0 - 37 U/L   ALT 12  0 - 35 U/L   Alkaline Phosphatase 98  39 - 117 U/L   Total Bilirubin 0.3  0.3 - 1.2 mg/dL   Bilirubin, Direct <1.6  0.0 - 0.3 mg/dL   Indirect Bilirubin NOT CALCULATED  0.3 - 0.9 mg/dL     EKG/XRAY:   Primary read interpreted by [facility]. Conley Rolls [facility] No fx/dislocation   ASSESSMENT/PLAN: Encounter Diagnoses  Name Primary?  . Pain Yes  . Sprain and strain   . Ankle pain   . Foot pain     Sprain and strain  Swedo given Continue with pain meds F/u prn RICE, ROM   Ronniesha Seibold PHUONG, DO 04/03/2012 9:31 PM

## 2012-04-18 ENCOUNTER — Emergency Department (HOSPITAL_COMMUNITY)
Admission: EM | Admit: 2012-04-18 | Discharge: 2012-04-18 | Disposition: A | Discharge status: Home / Self Care | Payer: Medicare Other | Attending: Emergency Medicine | Admitting: Emergency Medicine

## 2012-04-18 ENCOUNTER — Emergency Department (HOSPITAL_COMMUNITY): Payer: Medicare Other

## 2012-04-18 ENCOUNTER — Encounter (HOSPITAL_COMMUNITY): Payer: Self-pay | Admitting: *Deleted

## 2012-04-18 DIAGNOSIS — R61 Generalized hyperhidrosis: Secondary | ICD-10-CM | POA: Insufficient documentation

## 2012-04-18 DIAGNOSIS — R079 Chest pain, unspecified: Secondary | ICD-10-CM

## 2012-04-18 DIAGNOSIS — R0602 Shortness of breath: Secondary | ICD-10-CM

## 2012-04-18 DIAGNOSIS — F17200 Nicotine dependence, unspecified, uncomplicated: Secondary | ICD-10-CM | POA: Insufficient documentation

## 2012-04-18 DIAGNOSIS — Z9089 Acquired absence of other organs: Secondary | ICD-10-CM | POA: Insufficient documentation

## 2012-04-18 DIAGNOSIS — E039 Hypothyroidism, unspecified: Secondary | ICD-10-CM | POA: Insufficient documentation

## 2012-04-18 DIAGNOSIS — Z7982 Long term (current) use of aspirin: Secondary | ICD-10-CM | POA: Insufficient documentation

## 2012-04-18 LAB — CBC WITH DIFFERENTIAL/PLATELET
Basophils Absolute: 0 10*3/uL (ref 0.0–0.1)
Basophils Relative: 1 % (ref 0–1)
Eosinophils Absolute: 0.1 10*3/uL (ref 0.0–0.7)
Eosinophils Relative: 1 % (ref 0–5)
Lymphs Abs: 1.5 10*3/uL (ref 0.7–4.0)
MCH: 26.8 pg (ref 26.0–34.0)
MCHC: 33.1 g/dL (ref 30.0–36.0)
MCV: 81 fL (ref 78.0–100.0)
Neutrophils Relative %: 71 % (ref 43–77)
Platelets: 264 10*3/uL (ref 150–400)
RBC: 5.89 MIL/uL — ABNORMAL HIGH (ref 3.87–5.11)
RDW: 19.3 % — ABNORMAL HIGH (ref 11.5–15.5)

## 2012-04-18 LAB — URINALYSIS, ROUTINE W REFLEX MICROSCOPIC
Ketones, ur: NEGATIVE mg/dL
Leukocytes, UA: NEGATIVE
Nitrite: NEGATIVE
Protein, ur: 30 mg/dL — AB
Urobilinogen, UA: 0.2 mg/dL (ref 0.0–1.0)
pH: 5 (ref 5.0–8.0)

## 2012-04-18 LAB — COMPREHENSIVE METABOLIC PANEL
AST: 20 U/L (ref 0–37)
Albumin: 4.2 g/dL (ref 3.5–5.2)
Alkaline Phosphatase: 130 U/L — ABNORMAL HIGH (ref 39–117)
CO2: 26 mEq/L (ref 19–32)
Chloride: 100 mEq/L (ref 96–112)
Creatinine, Ser: 0.7 mg/dL (ref 0.50–1.10)
GFR calc non Af Amer: >90 mL/min (ref >90)
Potassium: 3.9 mEq/L (ref 3.5–5.1)
Total Bilirubin: 0.4 mg/dL (ref 0.3–1.2)

## 2012-04-18 LAB — TROPONIN I: Troponin I: <0.3 ng/mL (ref <0.30)

## 2012-04-18 LAB — URINE MICROSCOPIC-ADD ON

## 2012-04-18 LAB — GLUCOSE, CAPILLARY

## 2012-04-18 NOTE — ED Notes (Signed)
EKG completed at 1742 and given to Dr. Effie Shy along with OLD ekg.

## 2012-04-18 NOTE — ED Provider Notes (Signed)
History     CSN: [id number]  Arrival date & time 04/18/12  1740   First MD Initiated Contact with Patient 04/18/12 1919      No chief complaint on file.    Patient is a 56 year old female with past medical history significant for hypothyroidism, anxiety, and common bile duct disease who presents with complaints of intermittent episodes of diaphoresis, chest pain and shortness of breath. Patient reports waking up this morning and noting that she was diaphoretic. She has had several episodes of spontaneous diaphoresis throughout the day. In addition patient states that yesterday around 2:15 pm she had gradual onset of substernal 9/10 pressure-like chest pain. She had no other associated symptoms at that time but did take a nitrate that she had under unknown circumstances and it relieved the pain. Patient has had 2 additional episodes throughout the day, similar to yesterday afternoon, each last about 10 minutes and was resolved spontaneously.  She reports some intermittent episodes of shortness of breath and wheezing but none currently.  (Consider location/radiation/quality/duration/timing/severity/associated sxs/prior treatment) Patient is a 56 y.o. female presenting with chest pain. The history is provided by the patient and medical records. No language interpreter was used.  Chest Pain The chest pain began yesterday. Chest pain occurs intermittently. The chest pain is resolved. At its most intense, the pain is at 9/10. The pain is currently at 0/10. The severity of the pain is severe. The quality of the pain is described as pressure-like. The pain does not radiate.  Associated symptoms include diaphoresis.  Pertinent negatives for associated symptoms include no weakness. She tried nitroglycerin for the symptoms.  Procedure history is negative for cardiac catheterization and echocardiogram.     Past Medical History  Diagnosis Date  . Hypothyroidism   . Anxiety disorder   . Kidney stones    . Lymphedema of leg     leg  . Common bile duct stone   . Iron deficiency   . Anemia     Past Surgical History  Procedure Date  . Back surgery 1987-2007    X 10 Surgery  . Cholecystectomy   . Appendectomy   . Ercp 07/19/2011    Procedure: ENDOSCOPIC RETROGRADE CHOLANGIOPANCREATOGRAPHY (ERCP);  Surgeon: Louis Meckel, MD;  Location: [facility];  Service: Endoscopy;  Laterality: N/A;  . Enteroscopy 11/10/2011    Procedure: ENTEROSCOPY;  Surgeon: Louis Meckel, MD;  Location: [facility];  Service: Endoscopy;  Laterality: N/A;  . Hot hemostasis 11/10/2011    Procedure: HOT HEMOSTASIS (ARGON PLASMA COAGULATION/BICAP);  Surgeon: Louis Meckel, MD;  Location: [facility];  Service: Endoscopy;  Laterality: N/A;    Family History  Problem Relation Age of Onset  . Colon polyps Father   . Diabetes Father   . Colon cancer Neg Hx   . Malignant hyperthermia Neg Hx     History  Substance Use Topics  . Smoking status: Current Every Day Smoker -- 0.7 packs/day    Types: Cigarettes  . Smokeless tobacco: Never Used  . Alcohol Use: No    OB History    Grav Para Term Preterm Abortions TAB SAB Ect Mult Living                  Review of Systems  Constitutional: Positive for diaphoresis.  Cardiovascular: Positive for chest pain.  Neurological: Negative for weakness.  All other systems reviewed and are negative.    Allergies  Review of patient's allergies indicates no known allergies.  Home Medications  Current Outpatient Rx  Name Route Sig Dispense Refill  . ASPIRIN-ACETAMINOPHEN-CAFFEINE 250-250-65 MG PO TABS Oral Take 1 tablet by mouth every 6 (six) hours as needed. As needed.     Marland Kitchen GABAPENTIN 300 MG PO CAPS Oral Take 600 mg by mouth 3 (three) times daily. 1-2 capsules at bedtime    . LEVOTHYROXINE SODIUM 150 MCG PO TABS Oral Take 137 mcg by mouth daily. Take 1 tablet by mouth on Tuesday, Thursday, Saturday (on Monday Wednesday, Friday take 175 mcg) Sunday, do not take  any synthroid.    Marland Kitchen LEVOTHYROXINE SODIUM 175 MCG PO TABS Oral Take 175 mcg by mouth. Take 1 tablet by mouth on Monday, Wednesday, Friday (take 150 mg on Tuesday, Thursday, Saturday) do not take any synthroid on Sunday     . MORPHINE SULFATE ER 60 MG PO TB12 Oral Take 60 mg by mouth 3 (three) times daily.      Marland Kitchen OLMESARTAN MEDOXOMIL-HCTZ 40-25 MG PO TABS Oral Take 1 tablet by mouth once.      BP 134/72  Pulse 68  Temp 98.2 F (36.8 C) (Oral)  Resp 18  SpO2 100%  Physical Exam  Nursing note and vitals reviewed. Constitutional: She is oriented to person, place, and time. She appears well-developed and well-nourished. No distress.  HENT:  Head: Normocephalic and atraumatic.  Right Ear: External ear normal.  Left Ear: External ear normal.  Nose: Nose normal.  Mouth/Throat: Oropharynx is clear and moist.  Eyes: Conjunctivae normal and EOM are normal. Pupils are equal, round, and reactive to light.  Neck: Normal range of motion. Neck supple.  Cardiovascular: Normal rate, regular rhythm, normal heart sounds and intact distal pulses.   Pulmonary/Chest: Effort normal and breath sounds normal.  Abdominal: Soft. Bowel sounds are normal.  Musculoskeletal: Normal range of motion. She exhibits no edema and no tenderness.  Neurological: She is alert and oriented to person, place, and time. She has normal reflexes.  Skin: Skin is warm and dry.  Psychiatric: She has a normal mood and affect.    ED Course  Procedures (including critical care time)  Labs Reviewed  COMPREHENSIVE METABOLIC PANEL - Abnormal; Notable for the following:    Glucose, Bld 110 (*)     Alkaline Phosphatase 130 (*)     All other components within normal limits  CBC WITH DIFFERENTIAL - Abnormal; Notable for the following:    RBC 5.89 (*)     Hemoglobin 15.8 (*)     HCT 47.7 (*)     RDW 19.3 (*)     All other components within normal limits  URINALYSIS, ROUTINE W REFLEX MICROSCOPIC - Abnormal; Notable for the  following:    APPearance CLOUDY (*)     Hgb urine dipstick MODERATE (*)     Bilirubin Urine SMALL (*)     Protein, ur 30 (*)     All other components within normal limits  GLUCOSE, CAPILLARY - Abnormal; Notable for the following:    Glucose-Capillary 100 (*)     All other components within normal limits  URINE MICROSCOPIC-ADD ON - Abnormal; Notable for the following:    Squamous Epithelial / LPF MANY (*)     All other components within normal limits  TROPONIN I   Dg Chest 2 View  04/18/2012  *RADIOLOGY REPORT*  Clinical Data: Chest pain, smoker.  CHEST - 2 VIEW  Comparison: 03/04/2012  Findings: Mild cardiomegaly.  Lungs are clear.  No effusions or edema.  No acute bony abnormality.  IMPRESSION: No active cardiopulmonary disease.   Original Report Authenticated By: Cyndie Chime, M.D.      Date: 04/18/2012  Rate: 92  Rhythm: normal sinus rhythm  QRS Axis: normal  Intervals: normal  ST/T Wave abnormalities: nonspecific ST changes  Conduction Disutrbances:none  Narrative Interpretation:   Old EKG Reviewed: unchanged   1. Chest pain   2. Diaphoresis   3. Shortness of breath       MDM   Patient is a 56 year old female who presents with intermittent episodes of chest pressure, diaphoresis, and shortness of breath. Upon arrival in the emergency department patient found to be afebrile and vital signs remarkable only for initial blood pressure of 159/98. Of note an initial oxygen saturation 93% on room air was documented however during my examination patient satting 100% on room air. Physical exam as above and unremarkable. Specifically patient had no peripheral edema, no JVD, and normal heart/lung sounds. Review of imaging and labs obtained in triage shows no acute abnormality. Troponin within normal limits. Chest x-ray completed and showed no evidence of acute cardiopulmonary abnormality.  EKG wnl and unchanged from prior.  Although patient's presentation atypical for ACS it was felt  that observation under CP protocol was appropriate.  However, when patient was presented with this option, she stated that if she was not having a "heart attack" right now she would rather just follow up with PCP.  With unremarkable VS and labs, currently chest pain free this was thought to be appropriate.  Patient discharged without acute events.          Johnney Ou, MD 04/18/12 [id number]

## 2012-04-18 NOTE — ED Notes (Signed)
PT states woke up this am and her bed was wet and she keeps having these sweats with chest tight and hard to breath.  Pt has pain in left left leg and right flank pain.  No long trips.  Pt is diaphoretic

## 2012-04-18 NOTE — ED Notes (Signed)
MD at bedside. 

## 2012-04-19 NOTE — ED Provider Notes (Signed)
I saw and evaluated the patient, reviewed the resident's note and I agree with the findings and plan.  The patient presents complaining of chest pain that has been occurring intermittently for the past week.  Became worse today, so presents for evaluation.  On exam, the patient is afebrile and the vitals are stable.  The heart and lung exam are unremarkable and the abdomen is benign.  The ekg, chest xray, and cardiac enzymes are all unremarkable.  I discussed the possibility of CDU for chest pain rule with this patient and explained the risks and benefits of observation vs outpatient workup. She has decided against CDU observation and would like to see her pcp to have these arrangements made.  She assures me that she will return if her symptoms worsen.     Geoffery Lyons, MD 04/19/12 [phone]

## 2012-05-30 DIAGNOSIS — D649 Anemia, unspecified: Secondary | ICD-10-CM | POA: Insufficient documentation

## 2012-09-09 DIAGNOSIS — G894 Chronic pain syndrome: Secondary | ICD-10-CM | POA: Insufficient documentation

## 2012-09-09 DIAGNOSIS — M961 Postlaminectomy syndrome, not elsewhere classified: Secondary | ICD-10-CM | POA: Insufficient documentation

## 2012-09-28 IMAGING — CR DG CHEST 2V
2 series · 2 of 2 positions shown · non-contrast
Comparison: 03/04/2012

CLINICAL DATA: Chest pain, smoker.

CHEST - 2 VIEW

[w chest pa]
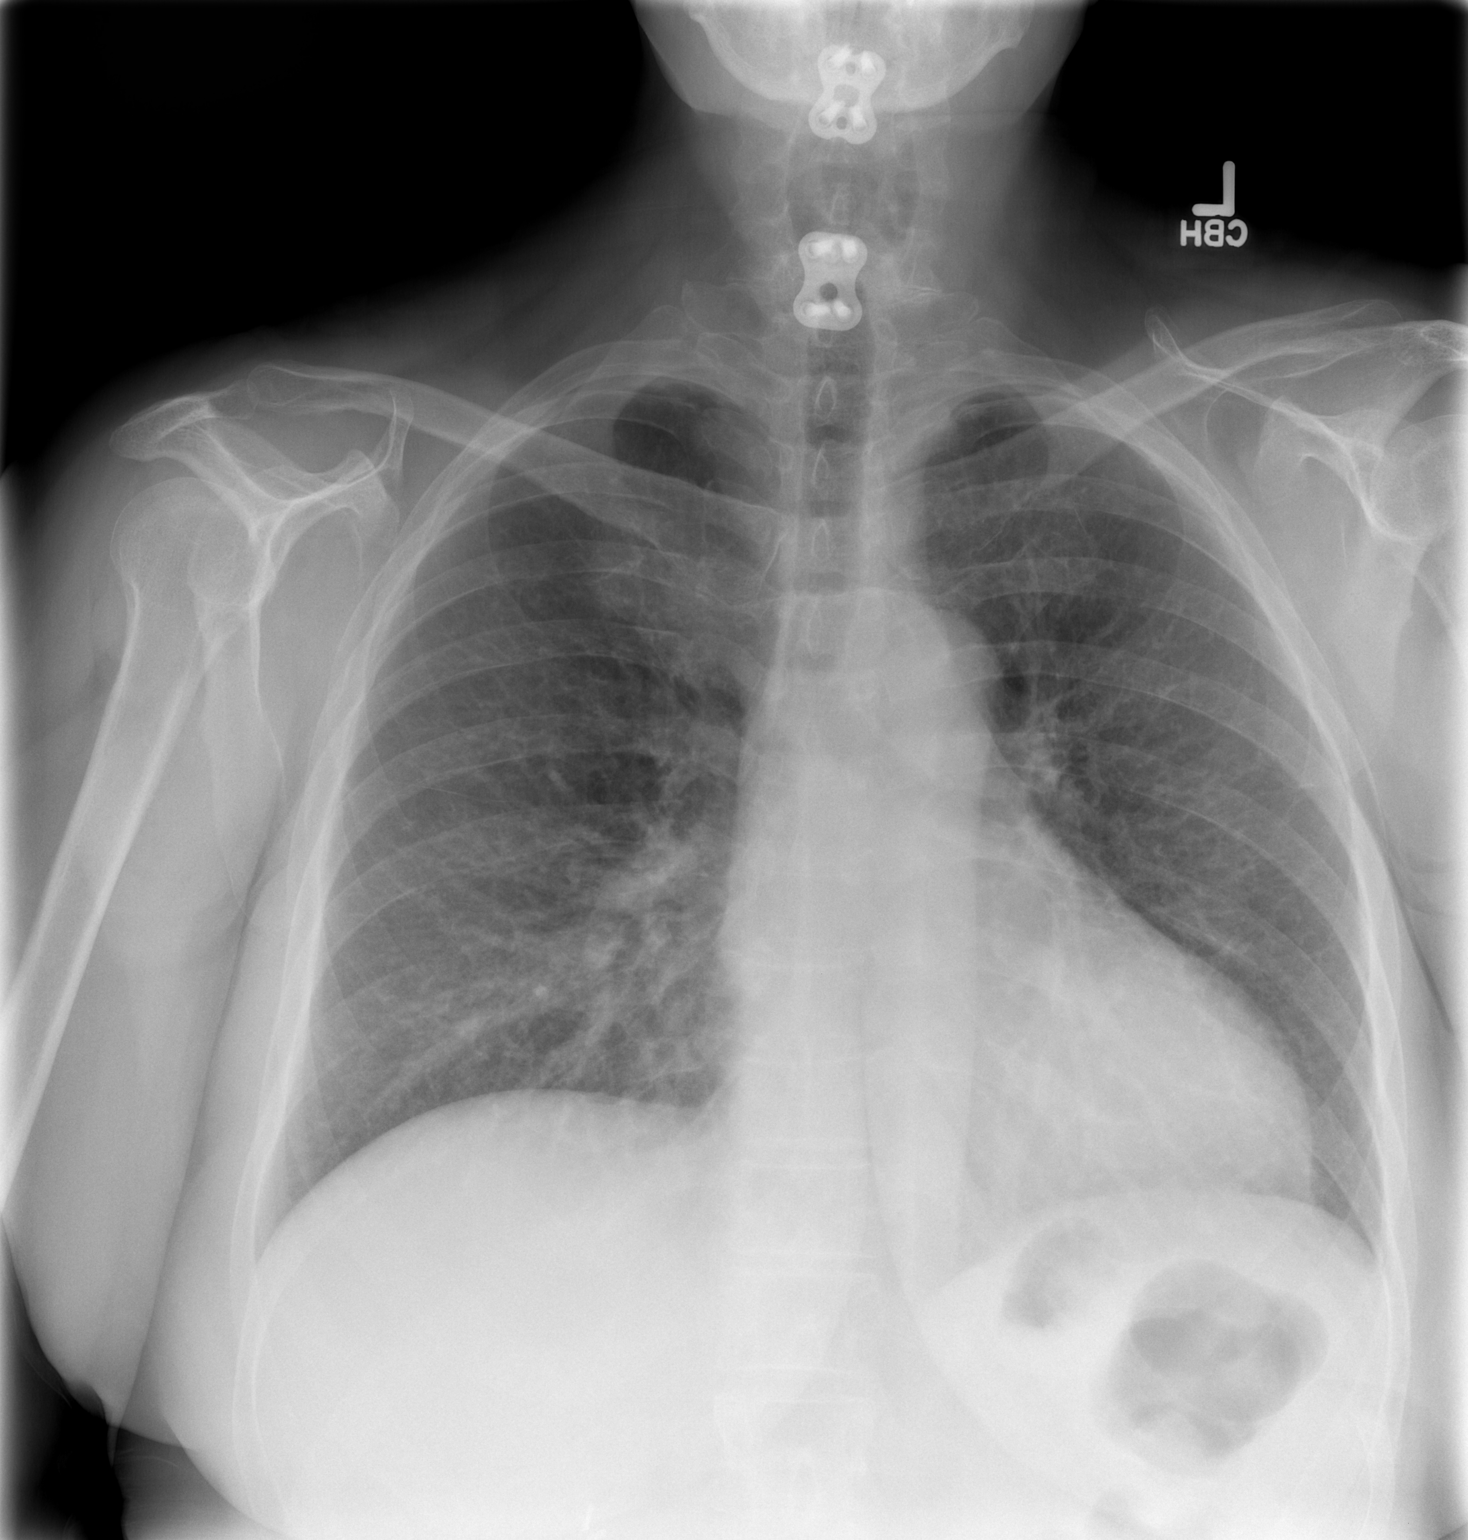

[w chest lat]
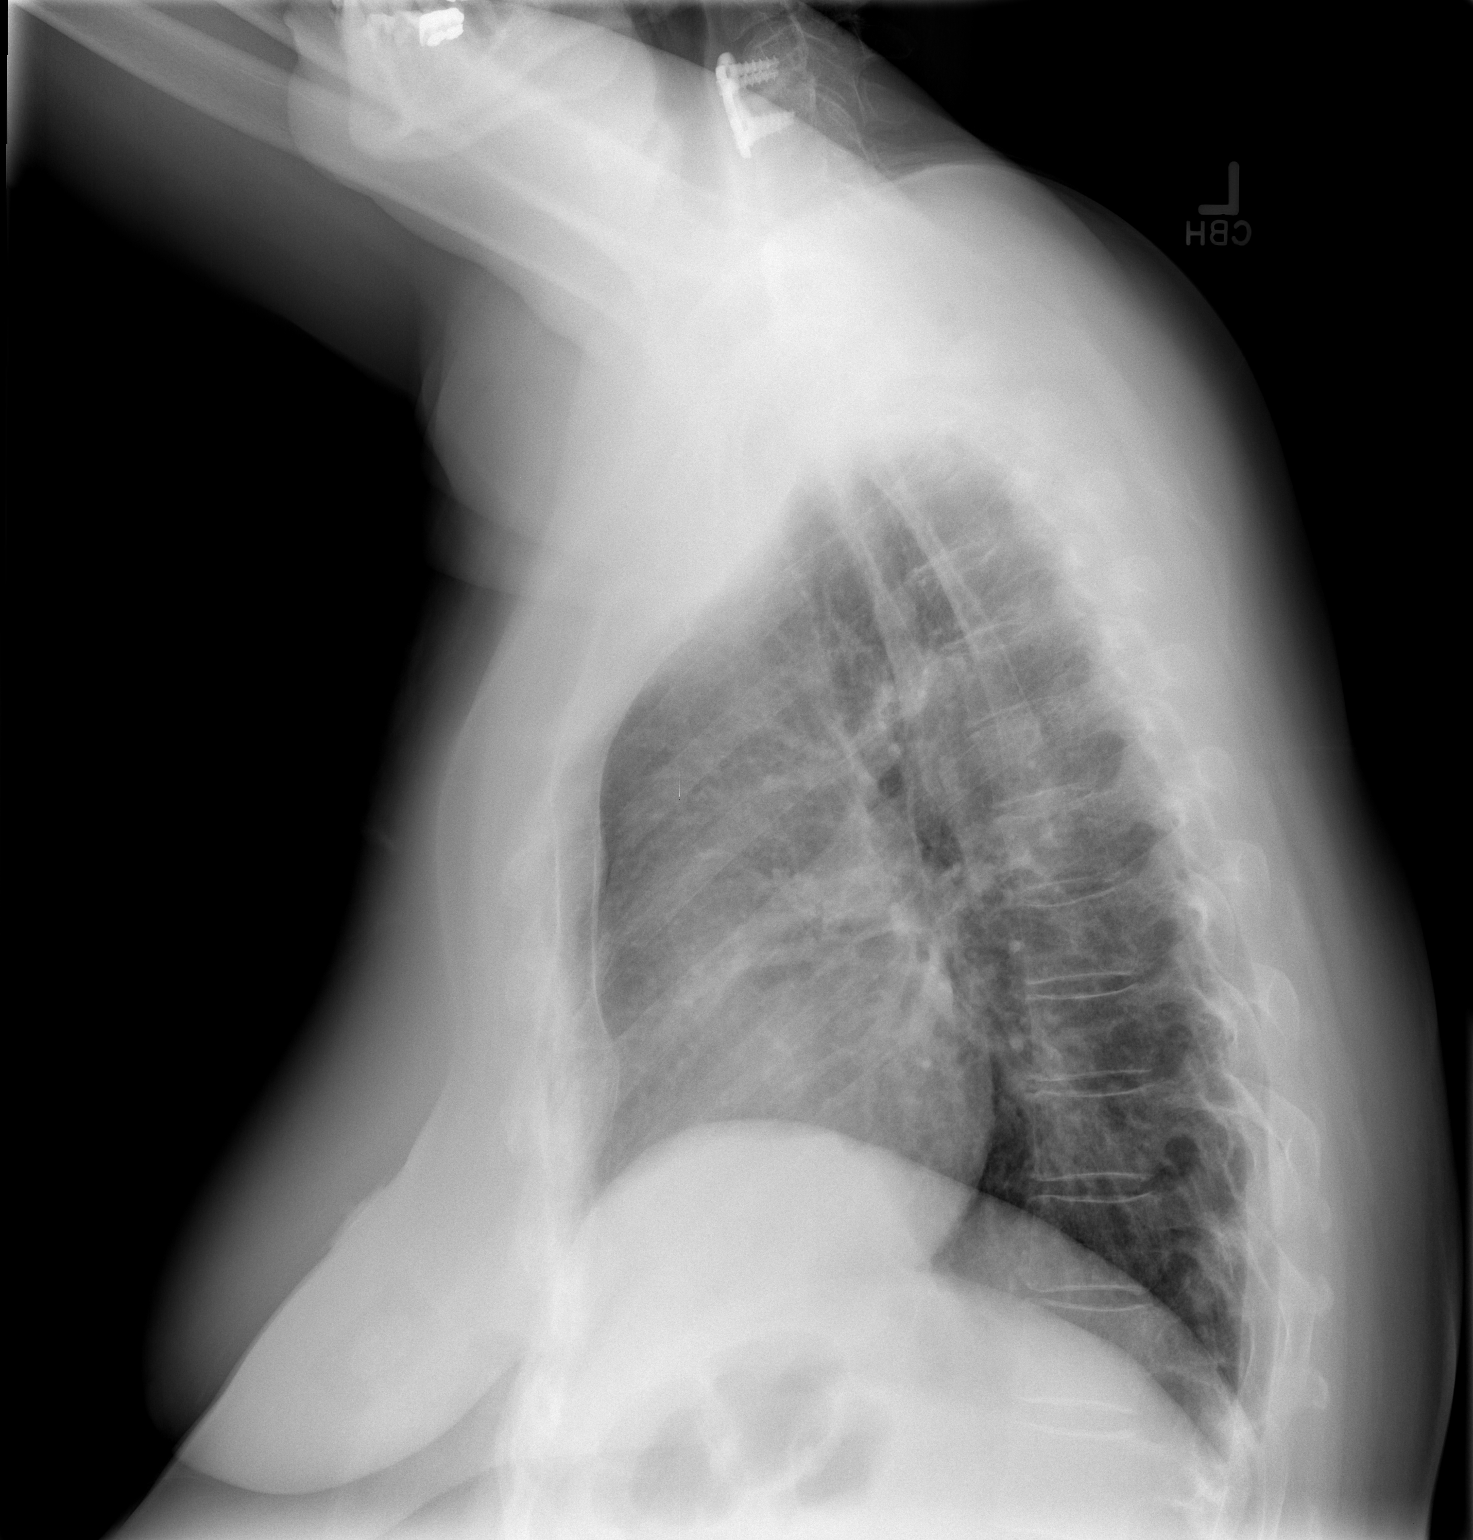

[2 of 2 positions shown; findings below may reference images not displayed]

FINDINGS: Mild cardiomegaly.  Lungs are clear.  No effusions or
edema.  No acute bony abnormality.
IMPRESSION: No active cardiopulmonary disease.

## 2012-12-28 ENCOUNTER — Other Ambulatory Visit: Payer: Self-pay | Admitting: Neurosurgery

## 2012-12-28 DIAGNOSIS — M549 Dorsalgia, unspecified: Secondary | ICD-10-CM

## 2013-01-02 ENCOUNTER — Ambulatory Visit
Admission: RE | Admit: 2013-01-02 | Discharge: 2013-01-02 | Disposition: A | Discharge status: Home / Self Care | Payer: Medicare Other | Source: Ambulatory Visit | Attending: Neurosurgery | Admitting: Neurosurgery

## 2013-01-02 DIAGNOSIS — M549 Dorsalgia, unspecified: Secondary | ICD-10-CM

## 2014-02-03 ENCOUNTER — Other Ambulatory Visit: Payer: Self-pay | Admitting: Neurosurgery

## 2014-02-03 DIAGNOSIS — M5126 Other intervertebral disc displacement, lumbar region: Secondary | ICD-10-CM

## 2014-02-10 ENCOUNTER — Ambulatory Visit
Admission: RE | Admit: 2014-02-10 | Discharge: 2014-02-10 | Disposition: A | Discharge status: Home / Self Care | Payer: Medicare Other | Source: Ambulatory Visit | Attending: Neurosurgery | Admitting: Neurosurgery

## 2014-02-10 DIAGNOSIS — M5126 Other intervertebral disc displacement, lumbar region: Secondary | ICD-10-CM

## 2014-02-10 MED ORDER — GADOBENATE DIMEGLUMINE 529 MG/ML IV SOLN
13.0000 mL | Freq: Once | INTRAVENOUS | Status: AC | PRN
  Administered 2014-02-10: 13 mL via INTRAVENOUS

## 2014-04-21 ENCOUNTER — Encounter: Payer: Self-pay | Admitting: Gastroenterology

## 2014-10-10 ENCOUNTER — Other Ambulatory Visit: Payer: Self-pay | Admitting: Neurosurgery

## 2014-10-10 DIAGNOSIS — M542 Cervicalgia: Secondary | ICD-10-CM

## 2014-10-21 ENCOUNTER — Ambulatory Visit
Admission: RE | Admit: 2014-10-21 | Discharge: 2014-10-21 | Disposition: A | Discharge status: Home / Self Care | Payer: Medicare Other | Source: Ambulatory Visit | Attending: Neurosurgery | Admitting: Neurosurgery

## 2014-10-21 DIAGNOSIS — M542 Cervicalgia: Secondary | ICD-10-CM

## 2015-07-13 ENCOUNTER — Other Ambulatory Visit: Payer: Self-pay | Admitting: Acute Care

## 2015-07-13 DIAGNOSIS — F17210 Nicotine dependence, cigarettes, uncomplicated: Secondary | ICD-10-CM

## 2015-07-20 ENCOUNTER — Encounter: Payer: Medicare Other | Admitting: Acute Care

## 2015-07-29 ENCOUNTER — Other Ambulatory Visit: Payer: Self-pay | Admitting: Acute Care

## 2015-07-29 DIAGNOSIS — F17210 Nicotine dependence, cigarettes, uncomplicated: Secondary | ICD-10-CM

## 2015-08-12 ENCOUNTER — Ambulatory Visit (INDEPENDENT_AMBULATORY_CARE_PROVIDER_SITE_OTHER)
Admission: RE | Admit: 2015-08-12 | Discharge: 2015-08-12 | Disposition: A | Discharge status: Home / Self Care | Payer: Medicare Other | Source: Ambulatory Visit | Attending: Acute Care | Admitting: Acute Care

## 2015-08-12 ENCOUNTER — Encounter: Payer: Self-pay | Admitting: Acute Care

## 2015-08-12 ENCOUNTER — Ambulatory Visit (INDEPENDENT_AMBULATORY_CARE_PROVIDER_SITE_OTHER): Payer: Medicare Other | Admitting: Acute Care

## 2015-08-12 DIAGNOSIS — F17210 Nicotine dependence, cigarettes, uncomplicated: Secondary | ICD-10-CM

## 2015-08-12 NOTE — Progress Notes (Signed)
Shared Decision Making Visit Lung Cancer Screening Program [phone])   Eligibility:  Age 60 y.o.  Pack Years Smoking History Calculation 35 pack year smoker (# packs/per year x # years smoked)  Recent History of coughing up blood  no  Unexplained weight loss? no ( >Than 15 pounds within the last 6 months )  Prior History Lung / other cancer no (Diagnosis within the last 5 years already requiring surveillance chest CT Scans).  Smoking Status Current Smoker  Former Smokers: Years since quit:NA  Quit Date: NA  Visit Components:  Discussion included one or more decision making aids. yes  Discussion included risk/benefits of screening. yes  Discussion included potential follow up diagnostic testing for abnormal scans. yes  Discussion included meaning and risk of over diagnosis. yes  Discussion included meaning and risk of False Positives. yes  Discussion included meaning of total radiation exposure. yes  Counseling Included:  Importance of adherence to annual lung cancer LDCT screening. yes  Impact of comorbidities on ability to participate in the program. yes  Ability and willingness to under diagnostic treatment. yes  Smoking Cessation Counseling:  Current Smokers:   Discussed importance of smoking cessation. yes  Information about tobacco cessation classes and interventions provided to patient. yes  Patient provided with "ticket" for LDCT Scan. yes  Symptomatic Patient. no  Counseling: NA  Diagnosis Code: Tobacco Use Z72.0  Asymptomatic Patient yes  Counseling (Intermediate counseling: > three minutes counseling) [id number]  Former Smokers:   Discussed the importance of maintaining cigarette abstinence. yes  Diagnosis Code: Personal History of Nicotine Dependence. [id number]  Information about tobacco cessation classes and interventions provided to patient. Yes  Patient provided with "ticket" for LDCT Scan. yes  Written Order for Lung Cancer Screening  with LDCT placed in Epic. Yes (CT Chest Lung Cancer Screening Low Dose W/O CM) [id number] Z12.2-Screening of respiratory organs Z87.891-Personal history of nicotine dependence  I have spent 15 minutes of face to face time with Ms. Fike discussing the risks and benefits of lung cancer screening. We viewed a power point together that explained in detail the above noted topics. We paused at intervals to allow for questions to be asked and answered to ensure understanding.We discussed that the single most powerful action that she can take to decrease her risk of developing lung cancer is to quit smoking. We discussed whether or not she is ready to commit to setting a quit date. We discussed options for tools to aid in quitting smoking including nicotine replacement therapy, non-nicotine medications, support groups, Quit Smart classes, and behavior modification. We discussed that often times setting smaller, more achievable goals, such as eliminating 1 cigarette a day for a week and then 2 cigarettes a day for a week can be helpful in slowly decreasing the number of cigarettes smoked. This allows for a sense of accomplishment as well as providing a clinical benefit. I gave Ms. Fike the " Be Stronger Than Your Excuses" card with contact information for community resources, classes, free nicotine replacement therapy, and access to mobile apps, text messaging, and on-line smoking cessation help. I have also given her my card and contact information in the event she needs to contact me. We discussed the time and location of the scan, and that either June Leap, CMA, or I will call with the results within 24-48 hours of receiving them. I have provided her with a copy of the power point we viewed  as a resource in the event they need reinforcement of  the concepts we discussed today in the office. The patient verbalized understanding of all of  the above and had no further questions upon leaving the office. They have my  contact information in the event they have any further questions.   Magdalen Spatz, NP

## 2015-08-14 ENCOUNTER — Telehealth: Payer: Self-pay | Admitting: Acute Care

## 2015-08-18 NOTE — Telephone Encounter (Signed)
error 

## 2015-11-12 ENCOUNTER — Other Ambulatory Visit: Payer: Self-pay | Admitting: Neurosurgery

## 2015-11-12 DIAGNOSIS — M542 Cervicalgia: Secondary | ICD-10-CM

## 2015-11-12 DIAGNOSIS — M5023 Other cervical disc displacement, cervicothoracic region: Secondary | ICD-10-CM

## 2015-11-13 ENCOUNTER — Ambulatory Visit
Admission: RE | Admit: 2015-11-13 | Discharge: 2015-11-13 | Disposition: A | Discharge status: Home / Self Care | Payer: Medicare Other | Source: Ambulatory Visit | Attending: Neurosurgery | Admitting: Neurosurgery

## 2015-11-13 DIAGNOSIS — M542 Cervicalgia: Secondary | ICD-10-CM

## 2015-11-13 DIAGNOSIS — M5023 Other cervical disc displacement, cervicothoracic region: Secondary | ICD-10-CM

## 2016-07-15 DIAGNOSIS — M545 Low back pain, unspecified: Secondary | ICD-10-CM | POA: Insufficient documentation

## 2016-08-08 HISTORY — PX: FRACTURE SURGERY: SHX138

## 2016-08-09 ENCOUNTER — Other Ambulatory Visit: Payer: Self-pay | Admitting: Acute Care

## 2016-08-09 DIAGNOSIS — F17210 Nicotine dependence, cigarettes, uncomplicated: Secondary | ICD-10-CM

## 2016-09-05 ENCOUNTER — Encounter (HOSPITAL_COMMUNITY): Payer: Self-pay | Admitting: Emergency Medicine

## 2016-09-05 ENCOUNTER — Emergency Department (HOSPITAL_COMMUNITY)
Admission: EM | Admit: 2016-09-05 | Discharge: 2016-09-05 | Disposition: A | Discharge status: Home / Self Care | Payer: Medicare Other | Attending: Emergency Medicine | Admitting: Emergency Medicine

## 2016-09-05 ENCOUNTER — Emergency Department (HOSPITAL_COMMUNITY): Payer: Medicare Other

## 2016-09-05 DIAGNOSIS — E039 Hypothyroidism, unspecified: Secondary | ICD-10-CM | POA: Diagnosis not present

## 2016-09-05 DIAGNOSIS — Y9389 Activity, other specified: Secondary | ICD-10-CM | POA: Diagnosis not present

## 2016-09-05 DIAGNOSIS — Y999 Unspecified external cause status: Secondary | ICD-10-CM | POA: Insufficient documentation

## 2016-09-05 DIAGNOSIS — Y92009 Unspecified place in unspecified non-institutional (private) residence as the place of occurrence of the external cause: Secondary | ICD-10-CM | POA: Insufficient documentation

## 2016-09-05 DIAGNOSIS — W0110XA Fall on same level from slipping, tripping and stumbling with subsequent striking against unspecified object, initial encounter: Secondary | ICD-10-CM | POA: Diagnosis not present

## 2016-09-05 DIAGNOSIS — Z7982 Long term (current) use of aspirin: Secondary | ICD-10-CM | POA: Diagnosis not present

## 2016-09-05 DIAGNOSIS — F17210 Nicotine dependence, cigarettes, uncomplicated: Secondary | ICD-10-CM | POA: Diagnosis not present

## 2016-09-05 DIAGNOSIS — S52572A Other intraarticular fracture of lower end of left radius, initial encounter for closed fracture: Secondary | ICD-10-CM | POA: Diagnosis not present

## 2016-09-05 DIAGNOSIS — S6992XA Unspecified injury of left wrist, hand and finger(s), initial encounter: Secondary | ICD-10-CM | POA: Diagnosis present

## 2016-09-05 MED ORDER — OXYCODONE-ACETAMINOPHEN 5-325 MG PO TABS: 1.0000 | ORAL_TABLET | ORAL | 0 refills | Status: DC | PRN

## 2016-09-05 MED ORDER — OXYCODONE-ACETAMINOPHEN 5-325 MG PO TABS
2.0000 | ORAL_TABLET | Freq: Once | ORAL | Status: AC
  Administered 2016-09-05: 2 via ORAL
  Filled 2016-09-05: qty 2

## 2016-09-05 NOTE — Discharge Instructions (Signed)
CALL YOUR PAIN MANAGEMENT OFFICE PRIOR TO FILLING THE [facility] PRESCRIPTION TO INSURE YOUR PAIN MANAGEMENT CONTRACT IS NOT AFFECTED.

## 2016-09-05 NOTE — Progress Notes (Signed)
Orthopedic Tech Progress Note Patient Details:  Katie Gaines June 29, 1956 [id number]  Ortho Devices Type of Ortho Device: Sugartong splint, Arm sling Ortho Device/Splint Location: lue Ortho Device/Splint Interventions: Application   Karolee Stamps 09/05/2016, 6:43 AM

## 2016-09-05 NOTE — ED Triage Notes (Signed)
Pt states she was at home and tripped and fell on her left side. Currently c/o pain to left wrist. Swelling and bruising present.

## 2016-09-05 NOTE — ED Provider Notes (Signed)
[facility] DEPT Provider Note   CSN: [id number] Arrival date & time: 09/05/16  0442     History   Chief Complaint Chief Complaint  Patient presents with  . Wrist Pain    HPI Katie Gaines is a 61 y.o. female.  Patient presents after mechanical fall with complaint of left wrist pain. She tripped and fell on left side with outstretched arm. She reports she hit her head but there was no LOC, nausea or vomiting. She has been ambulatory since falling. She reports chronic low back pain that is getting progressively more sore and painful but no sharp or sudden increase to back pain at the time. No chest, abdominal or neck pain.   The history is provided by the patient and the spouse. No language interpreter was used.    Past Medical History:  Diagnosis Date  . Anemia   . Anxiety disorder   . Common bile duct stone   . Hypothyroidism   . Iron deficiency   . Kidney stones   . Lymphedema of leg    leg    Patient Active Problem List   Diagnosis Date Noted  . Obstruction of bile duct 07/19/2011  . Abnormal radiographic examination 07/19/2011  . IRON DEFICIENCY 09/10/2009    Past Surgical History:  Procedure Laterality Date  . APPENDECTOMY    . BACK SURGERY  1987-2007   X 10 Surgery  . CHOLECYSTECTOMY    . ENTEROSCOPY  11/10/2011   Procedure: ENTEROSCOPY;  Surgeon: Inda Castle, MD;  Location: [facility];  Service: Endoscopy;  Laterality: N/A;  . ERCP  07/19/2011   Procedure: ENDOSCOPIC RETROGRADE CHOLANGIOPANCREATOGRAPHY (ERCP);  Surgeon: Inda Castle, MD;  Location: [facility];  Service: Endoscopy;  Laterality: N/A;  . HOT HEMOSTASIS  11/10/2011   Procedure: HOT HEMOSTASIS (ARGON PLASMA COAGULATION/BICAP);  Surgeon: Inda Castle, MD;  Location: [facility];  Service: Endoscopy;  Laterality: N/A;    OB History    No data available       Home Medications    Prior to Admission medications   Medication Sig Start Date End Date Taking? Authorizing  Provider  aspirin-acetaminophen-caffeine (EXCEDRIN MIGRAINE) [phone] MG per tablet Take 1 tablet by mouth every 6 (six) hours as needed. As needed.     Historical Provider, MD  gabapentin (NEURONTIN) 300 MG capsule Take 600 mg by mouth 3 (three) times daily. 1-2 capsules at bedtime    Historical Provider, MD  levothyroxine (SYNTHROID, LEVOTHROID) 150 MCG tablet Take 137 mcg by mouth daily. Take 1 tablet by mouth on Tuesday, Thursday, Saturday (on Monday Wednesday, Friday take 175 mcg) Sunday, do not take any synthroid.    Historical Provider, MD  levothyroxine (SYNTHROID, LEVOTHROID) 175 MCG tablet Take 175 mcg by mouth. Take 1 tablet by mouth on Monday, Wednesday, Friday (take 150 mg on Tuesday, Thursday, Saturday) do not take any synthroid on Sunday     Historical Provider, MD  morphine (MS CONTIN) 60 MG 12 hr tablet Take 60 mg by mouth 3 (three) times daily.      Historical Provider, MD  olmesartan-hydrochlorothiazide (BENICAR HCT) 40-25 MG per tablet Take 1 tablet by mouth once.    Historical Provider, MD    Family History Family History  Problem Relation Age of Onset  . Colon polyps Father   . Diabetes Father   . Colon cancer Neg Hx   . Malignant hyperthermia Neg Hx     Social History Social History  Substance Use Topics  .  Smoking status: Current Every Day Smoker    Packs/day: 1.00    Years: 35.00    Types: Cigarettes  . Smokeless tobacco: Never Used     Comment: Smokes 1/2 ppd now  . Alcohol use No     Allergies   Patient has no known allergies.   Review of Systems Review of Systems  Constitutional: Negative for chills and fever.  Respiratory: Negative.  Negative for shortness of breath.   Cardiovascular: Negative.  Negative for chest pain.  Gastrointestinal: Negative.  Negative for abdominal pain and nausea.  Musculoskeletal: Positive for back pain. Negative for neck pain.       See HPI.  Skin: Negative.  Negative for wound.  Neurological: Negative.  Negative  for headaches.     Physical Exam Updated Vital Signs BP 147/98   Pulse 71   Temp 98.6 F (37 C) (Oral)   Resp 18   Ht 5\' 5"  (1.651 m)   Wt 69.9 kg   SpO2 98%   BMI 25.63 kg/m   Physical Exam  Constitutional: She appears well-developed and well-nourished.  HENT:  Head: Normocephalic and atraumatic.  Neck: Normal range of motion. Neck supple.  Cardiovascular: Normal rate, regular rhythm and intact distal pulses.   Pulmonary/Chest: Effort normal and breath sounds normal. She has no wheezes. She has no rales. She exhibits no tenderness.  Abdominal: Soft. Bowel sounds are normal. There is no tenderness. There is no rebound and no guarding.  Musculoskeletal: Normal range of motion.  No midline cervical tenderness. Left wrist has mild dorsal deformity. Closed injury. FROM digits of left hand. No elbow tenderness or deformity.  Neurological: She is alert.  No sensory deficits to left hand.   Skin: Skin is warm and dry. Capillary refill takes less than 2 seconds. No rash noted.  Psychiatric: She has a normal mood and affect.     ED Treatments / Results  Labs (all labs ordered are listed, but only abnormal results are displayed) Labs Reviewed - No data to display  EKG  EKG Interpretation None       Radiology Dg Elbow Complete Left  Result Date: 09/05/2016 CLINICAL DATA:  Fall today with left arm pain. EXAM: LEFT ELBOW - COMPLETE 3+ VIEW COMPARISON:  None. FINDINGS: There is no evidence of fracture, dislocation, or joint effusion. There is no evidence of arthropathy or other focal bone abnormality. Soft tissues are unremarkable. IMPRESSION: Negative radiographs of the left elbow. Electronically Signed   By: Jeb Levering M.D.   On: 09/05/2016 05:41   Dg Wrist Complete Left  Result Date: 09/05/2016 CLINICAL DATA:  Trip and fall today with left wrist pain and swelling. EXAM: LEFT WRIST - COMPLETE 3+ VIEW COMPARISON:  None. FINDINGS: Comminuted distal radius fracture with  mild impaction and displacement. Fracture involves the radiocarpal and distal radioulnar joint. Articular depression at the radiocarpal joint of 2 mm. No associated ulna styloid fracture. Soft tissue edema is noted. 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 IMPRESSION: Comminuted intra-articular distal radius fracture with mild impaction and displacement Electronically Signed   By: Jeb Levering M.D.   On: 09/05/2016 05:41    Procedures Procedures (including critical care time)  Medications Ordered in ED Medications - No data to display   Initial Impression / Assessment and Plan / ED Course  I have reviewed the triage vital signs and the nursing notes.  Pertinent labs & imaging results that were available during my care of the patient were reviewed by  me and considered in my medical decision making (see chart for details).     Right hand dominant patient presents with left wrist pain and deformity after mechanical fall. No other injury. X-ray confirms intra-articular, impacted radius fracture. Discussed with Dr. Caralyn Guile who advises to splint the wrist and have the patient follow up on Tuesday in office.   Pain control provided. Sugar tong splint applied. Patient discharged home with follow up instructions.   Final Clinical Impressions(s) / ED Diagnoses   Final diagnoses:  None   1. Left radius fracture  New Prescriptions New Prescriptions   No medications on file     Charlann Lange, Hershal Coria 09/05/16 [place], MD 09/05/16 [id number]

## 2016-09-05 NOTE — ED Notes (Signed)
Ortho tech paged to apply sugar tong splint

## 2016-09-07 ENCOUNTER — Inpatient Hospital Stay: Admission: RE | Admit: 2016-09-07 | Payer: Medicare Other | Source: Ambulatory Visit

## 2016-12-02 ENCOUNTER — Other Ambulatory Visit: Payer: Self-pay | Admitting: Acute Care

## 2016-12-09 ENCOUNTER — Ambulatory Visit (INDEPENDENT_AMBULATORY_CARE_PROVIDER_SITE_OTHER)
Admission: RE | Admit: 2016-12-09 | Discharge: 2016-12-09 | Disposition: A | Discharge status: Home / Self Care | Payer: Medicare Other | Source: Ambulatory Visit | Attending: Acute Care | Admitting: Acute Care

## 2016-12-09 DIAGNOSIS — Z87891 Personal history of nicotine dependence: Secondary | ICD-10-CM

## 2016-12-09 DIAGNOSIS — F17210 Nicotine dependence, cigarettes, uncomplicated: Secondary | ICD-10-CM

## 2016-12-16 ENCOUNTER — Other Ambulatory Visit: Payer: Self-pay | Admitting: Acute Care

## 2016-12-16 DIAGNOSIS — F17210 Nicotine dependence, cigarettes, uncomplicated: Secondary | ICD-10-CM

## 2017-07-31 ENCOUNTER — Other Ambulatory Visit: Payer: Self-pay | Admitting: Student

## 2017-07-31 DIAGNOSIS — M5416 Radiculopathy, lumbar region: Secondary | ICD-10-CM

## 2017-07-31 DIAGNOSIS — M5412 Radiculopathy, cervical region: Secondary | ICD-10-CM

## 2017-08-04 ENCOUNTER — Ambulatory Visit
Admission: RE | Admit: 2017-08-04 | Discharge: 2017-08-04 | Disposition: A | Discharge status: Home / Self Care | Payer: Medicare Other | Source: Ambulatory Visit | Attending: Student | Admitting: Student

## 2017-08-04 DIAGNOSIS — M5416 Radiculopathy, lumbar region: Secondary | ICD-10-CM

## 2017-08-04 DIAGNOSIS — M5412 Radiculopathy, cervical region: Secondary | ICD-10-CM

## 2017-11-07 ENCOUNTER — Telehealth: Payer: Self-pay | Admitting: Acute Care

## 2017-11-08 NOTE — Telephone Encounter (Signed)
I tried calling the patient to schedule her LCS CT but I had to LVM for her to return my call

## 2017-11-08 NOTE — Telephone Encounter (Signed)
Anita, can you contact pt to schedule LDCT?  

## 2017-11-10 NOTE — Telephone Encounter (Signed)
Mrs. Katie Gaines has her LCS CT scheduled on 12/11/2017 @ 2:00pm and she is aware of appt and location

## 2017-11-10 NOTE — Telephone Encounter (Signed)
Following up to see if pt has scheduled her [employer] to [place] and [place] for update

## 2017-12-11 ENCOUNTER — Inpatient Hospital Stay: Admission: RE | Admit: 2017-12-11 | Payer: Medicare Other | Source: Ambulatory Visit

## 2017-12-28 ENCOUNTER — Encounter (HOSPITAL_COMMUNITY): Payer: Self-pay | Admitting: *Deleted

## 2017-12-28 ENCOUNTER — Emergency Department (HOSPITAL_COMMUNITY)
Admission: EM | Admit: 2017-12-28 | Discharge: 2017-12-28 | Disposition: A | Discharge status: Home / Self Care | Payer: Medicare Other | Attending: Emergency Medicine | Admitting: Emergency Medicine

## 2017-12-28 ENCOUNTER — Emergency Department (HOSPITAL_COMMUNITY): Payer: Medicare Other

## 2017-12-28 ENCOUNTER — Other Ambulatory Visit: Payer: Self-pay

## 2017-12-28 DIAGNOSIS — I10 Essential (primary) hypertension: Secondary | ICD-10-CM

## 2017-12-28 DIAGNOSIS — S99922A Unspecified injury of left foot, initial encounter: Secondary | ICD-10-CM

## 2017-12-28 DIAGNOSIS — Y999 Unspecified external cause status: Secondary | ICD-10-CM | POA: Diagnosis not present

## 2017-12-28 DIAGNOSIS — F17210 Nicotine dependence, cigarettes, uncomplicated: Secondary | ICD-10-CM | POA: Diagnosis not present

## 2017-12-28 DIAGNOSIS — W230XXA Caught, crushed, jammed, or pinched between moving objects, initial encounter: Secondary | ICD-10-CM | POA: Insufficient documentation

## 2017-12-28 DIAGNOSIS — Y939 Activity, unspecified: Secondary | ICD-10-CM | POA: Insufficient documentation

## 2017-12-28 DIAGNOSIS — S97112A Crushing injury of left great toe, initial encounter: Secondary | ICD-10-CM | POA: Insufficient documentation

## 2017-12-28 DIAGNOSIS — Y929 Unspecified place or not applicable: Secondary | ICD-10-CM | POA: Insufficient documentation

## 2017-12-28 MED ORDER — TETANUS-DIPHTH-ACELL PERTUSSIS 5-2.5-18.5 LF-MCG/0.5 IM SUSP
0.5000 mL | Freq: Once | INTRAMUSCULAR | Status: AC
  Administered 2017-12-28: 0.5 mL via INTRAMUSCULAR
  Filled 2017-12-28: qty 0.5

## 2017-12-28 MED ORDER — LIDOCAINE HCL (PF) 1 % IJ SOLN
15.0000 mL | Freq: Once | INTRAMUSCULAR | Status: AC
  Administered 2017-12-28: 15 mL
  Filled 2017-12-28: qty 15

## 2017-12-28 MED ORDER — HYDROCODONE-ACETAMINOPHEN 5-325 MG PO TABS: 1.0000 | ORAL_TABLET | Freq: Four times a day (QID) | ORAL | 0 refills | Status: AC | PRN

## 2017-12-28 MED ORDER — HYDROCODONE-ACETAMINOPHEN 5-325 MG PO TABS
1.0000 | ORAL_TABLET | Freq: Once | ORAL | Status: AC
  Administered 2017-12-28: 1 via ORAL
  Filled 2017-12-28: qty 1

## 2017-12-28 NOTE — Discharge Instructions (Signed)
Please see the information and instructions below regarding your visit.  Your diagnoses today include:  1. Injury of left great toe, initial encounter   2. Elevated systolic blood pressure reading with diagnosis of hypertension     Tests performed today include: See side panel of your discharge paperwork for testing performed today. Vital signs are listed at the bottom of these instructions.   Medications prescribed:    Take any prescribed medications only as prescribed, and any over the counter medications only as directed on the packaging.  You have been prescribed Norco for pain. This is an opioid pain medication. You may take this medication every 4-6 hours as needed for pain. Only take this medication if you need it for breakthrough pain.   Do not combine this medication with Tylenol, as it may increase the risk of liver problems.  Do not combine this medication with alcohol.  Please be advised to avoid driving or operating heavy machinery while taking this medication, as it may make you drowsy or impair judgment.    Home care instructions:  Please follow any educational materials contained in this packet.   The nail may gradually become bruised and fall off.  This may take an entire month for the old nail to fall off, but a new nail will begin growing underneath.  It may take up to 3 months for an entirely new nail to grow in.  Change the dressing daily, and as it becomes soiled or bloody.   Once you have a full scab formation in the a avulsed area of the toe, you may perform warm soaks of the toe.  You may just soak it with warm soap and water.  Follow-up instructions: Please follow-up with your primary care provider as needed for further evaluation of your symptoms if they are not completely improved.    Return instructions:  Please return to the Emergency Department if you experience worsening symptoms.  Please return to the emergency department if you develop any  redness in your toe, increase in swelling, green or yellow, pus looking drainage from your toe. Please return if you have any other emergent concerns.  Additional Information:   Your vital signs today were: BP (!) 153/94 (BP Location: Right Arm)    Pulse 88    Temp 98.7 F (37.1 C) (Oral)    Resp 18    Ht 5\' 6"  (1.676 m)    Wt 72.1 kg (159 lb)    SpO2 97%    BMI 25.66 kg/m  If your blood pressure (BP) was elevated on multiple readings during this visit above 130 for the top number or above 80 for the bottom number, please have this repeated by your primary care provider within one month. --------------  Thank you for allowing [place] to participate in your care today.

## 2017-12-28 NOTE — ED Provider Notes (Signed)
Patient placed in Quick Look pathway, seen and evaluated   Chief Complaint: Foot injury   HPI:   Pt complains of pain in her big toe and her foot after a shopping cart ran over her foot.   ROS: numbness to toes   Physical Exam:   Gen: No distress  Neuro: Awake and Alert  Skin: Warm    Focused Exam: swollen left first toe,  Pain with movement of foot    Initiation of care has begun. The patient has been counseled on the process, plan, and necessity for staying for the completion/evaluation, and the remainder of the medical screening examination   Sidney Ace 12/28/17 Deeann Cree, MD 12/28/17 [phone]

## 2017-12-28 NOTE — ED Triage Notes (Signed)
Pt was using a cart to get items from her car to apartment. The cart was on an incline and rolled back into L foot; nail deformity to L great toe, c/o shooting pains into foot. Unknown last tetanus shot

## 2017-12-28 NOTE — ED Provider Notes (Signed)
  Face-to-face evaluation   History: He complains of injury to her left great toe, when a cart rolled onto it.  Physical exam: Left great toe with avulsed nail, and laceration of the distal tip of toe, extending to the nailbed, with a flap of dislodged nail associated with the laceration.  Medical screening examination/treatment/procedure(s) were conducted as a shared visit with non-physician practitioner(s) and myself.  I personally evaluated the patient during the encounter    Daleen Bo, MD 12/29/17 1433

## 2017-12-28 NOTE — ED Provider Notes (Signed)
Katie Gaines Provider Note   CSN: [id number] Arrival date & time: 12/28/17  1943     History   Chief Complaint Chief Complaint  Patient presents with  . Foot Pain    HPI Katie Gaines is a 62 y.o. female.  HPI  Patient is a 62 year old female with a history of hypothyroidism and chronic low back pain presenting for injury to the left great toe.  Patient reports that she was pushing a car used to transport her items into her apartment, when it rolled back onto her left great toe.  Patient reports that there was immediately copious amounts of bleeding from it, and she had difficulty walking on it.  Patient denies any loss of sensation in the right great toe.  Patient does not take blood thinning medications.  Patient does not recall when her last tetanus shot was administered.  Past Medical History:  Diagnosis Date  . Anemia   . Anxiety disorder   . Common bile duct stone   . Hypothyroidism   . Iron deficiency   . Kidney stones   . Lymphedema of leg    leg    Patient Active Problem List   Diagnosis Date Noted  . Obstruction of bile duct 07/19/2011  . Abnormal radiographic examination 07/19/2011  . IRON DEFICIENCY 09/10/2009    Past Surgical History:  Procedure Laterality Date  . APPENDECTOMY    . BACK SURGERY  1987-2007   X 10 Surgery  . CHOLECYSTECTOMY    . ENTEROSCOPY  11/10/2011   Procedure: ENTEROSCOPY;  Surgeon: Inda Castle, MD;  Location: [facility];  Service: Endoscopy;  Laterality: N/A;  . ERCP  07/19/2011   Procedure: ENDOSCOPIC RETROGRADE CHOLANGIOPANCREATOGRAPHY (ERCP);  Surgeon: Inda Castle, MD;  Location: [facility];  Service: Endoscopy;  Laterality: N/A;  . HOT HEMOSTASIS  11/10/2011   Procedure: HOT HEMOSTASIS (ARGON PLASMA COAGULATION/BICAP);  Surgeon: Inda Castle, MD;  Location: [facility];  Service: Endoscopy;  Laterality: N/A;     OB History   None      Home Medications    Prior to  Admission medications   Medication Sig Start Date End Date Taking? Authorizing Provider  aspirin-acetaminophen-caffeine (EXCEDRIN MIGRAINE) [phone] MG per tablet Take 1 tablet by mouth every 6 (six) hours as needed. As needed.     [provider]  gabapentin (NEURONTIN) 300 MG capsule Take 600 mg by mouth 3 (three) times daily. 1-2 capsules at bedtime    [provider]  levothyroxine (SYNTHROID, LEVOTHROID) 150 MCG tablet Take 137 mcg by mouth daily. Take 1 tablet by mouth on Tuesday, Thursday, Saturday (on Monday Wednesday, Friday take 175 mcg) Sunday, do not take any synthroid.    [provider]  levothyroxine (SYNTHROID, LEVOTHROID) 175 MCG tablet Take 175 mcg by mouth. Take 1 tablet by mouth on Monday, Wednesday, Friday (take 150 mg on Tuesday, Thursday, Saturday) do not take any synthroid on Sunday     [provider]  morphine (MS CONTIN) 60 MG 12 hr tablet Take 60 mg by mouth 3 (three) times daily.      [provider]  olmesartan-hydrochlorothiazide (BENICAR HCT) 40-25 MG per tablet Take 1 tablet by mouth once.    [provider]  oxyCODONE-acetaminophen (PERCOCET/ROXICET) 5-325 MG tablet Take 1-2 tablets by mouth every 4 (four) hours as needed for severe pain. 09/05/16   Charlann Lange, PA-C    Family History Family History  Problem Relation Age of Onset  .  Colon polyps Father   . Diabetes Father   . Colon cancer Neg Hx   . Malignant hyperthermia Neg Hx     Social History Social History   Tobacco Use  . Smoking status: Current Every Day Smoker    Packs/day: 1.00    Years: 35.00    Pack years: 35.00    Types: Cigarettes  . Smokeless tobacco: Never Used  . Tobacco comment: Smokes 1/2 ppd now  Substance Use Topics  . Alcohol use: No    Alcohol/week: 0.0 oz  . Drug use: No     Allergies   Patient has no known allergies.   Review of Systems Review of Systems  Skin: Positive for wound.  Neurological: Negative  for weakness and numbness.     Physical Exam Updated Vital Signs BP (!) 153/94 (BP Location: Right Arm)   Pulse 88   Temp 98.7 F (37.1 C) (Oral)   Resp 18   Ht 5\' 6"  (1.676 m)   Wt 72.1 kg (159 lb)   SpO2 97%   BMI 25.66 kg/m   Physical Exam  Constitutional: She appears well-developed and well-nourished. No distress.  Sitting comfortably in bed.  HENT:  Head: Normocephalic and atraumatic.  Eyes: Conjunctivae are normal. Right eye exhibits no discharge. Left eye exhibits no discharge.  EOMs normal to gross examination.  Neck: Normal range of motion.  Cardiovascular: Normal rate and regular rhythm.  Intact, 2+ DP pulses bilaterally.  Pulmonary/Chest:  Normal respiratory effort. Patient converses comfortably. No audible wheeze or stridor.  Abdominal: She exhibits no distension.  Musculoskeletal: Normal range of motion.  See clinical photo for details.  Patient exhibits a avulsion of the tip of the toenail of the left great toe.  No point bony tenderness of the MTP or DIP joint.  Patient exhibits full sensation in the distal left great toe. Full range of motion of left ankle with no tenderness palpation of medial malleolus, lateral malleolus, base of fifth metatarsal, or navicular.  Neurological: She is alert.  Cranial nerves intact to gross observation. Patient moves extremities without difficulty.  Skin: Skin is warm and dry. She is not diaphoretic.  Psychiatric: She has a normal mood and affect. Her behavior is normal. Judgment and thought content normal.  Nursing note and vitals reviewed.      ED Treatments / Results  Labs (all labs ordered are listed, but only abnormal results are displayed) Labs Reviewed - No data to display  EKG None  Radiology No results found.  Procedures Irrigation and debridement Date/Time: 12/29/2017 4:32 AM Performed by: Albesa Seen, PA-C Authorized by: Albesa Seen, PA-C  Consent: Verbal consent obtained. Risks and  benefits: risks, benefits and alternatives were discussed Consent given by: patient Patient understanding: patient states understanding of the procedure being performed Imaging studies: imaging studies available Patient identity confirmed: verbally with patient Preparation: Irrigation with normal saline, and scrubbing with Betadine performed of the entire left foot. Local anesthesia used: yes Anesthesia: digital block  Anesthesia: Local anesthesia used: yes Local Anesthetic: lidocaine 1% without epinephrine Patient tolerance: Patient tolerated the procedure well with no immediate complications Comments: Area of avulsed nail debrided, and underlying tissue assessed for ability to primary closed with sutures.  Given the degradation of the tissue underlying the avulsed piece of nail, as well as the minimal tissue holding onto the toe, the avulsed skin was not eligible for primary closure, as it would be nonviable.  Dressed with Xeroform dressing.     (  including critical care time)  Medications Ordered in ED Medications  Tdap (BOOSTRIX) injection 0.5 mL (0.5 mLs Intramuscular Given 12/28/17 2004)     Initial Impression / Assessment and Plan / ED Course  I have reviewed the triage vital signs and the nursing notes.  Pertinent labs & imaging results that were available during my care of the patient were reviewed by me and considered in my medical decision making (see chart for details).     Patient is well-appearing and in no acute distress.  Patient neurologically intact in the distal left lower extremity.  X-ray obtained of the left foot, which demonstrates no foreign body in the affected toe, or fracture.  Toe soaked and irrigated prior to debridement.  Given the degradation of the tissue underlying the avulsed piece of nail, as well as the minimal tissue holding onto the toe, the avulsed skin was not eligible for primary closure, as it would be nonviable.  Dressed with Xeroform dressing  and instructed patient on how to perform these dressing changes.  Patient was given strict return precautions for any erythema, drainage, or increasing swelling of the toe.  Norco, 2-day supply prescribed for breakthrough pain.  Patient instructed not to drive, drink alcohol, or operate machinery while taking this medication.  This is a shared visit with Dr. Daleen Bo. Patient was independently evaluated by this attending physician. Attending physician consulted in evaluation and discharge management.  Final Clinical Impressions(s) / ED Diagnoses   Final diagnoses:  Injury of left great toe, initial encounter  Elevated systolic blood pressure reading with diagnosis of hypertension    ED Discharge Orders    None       Tamala Julian 12/29/17 [id number]    Daleen Bo, MD 12/29/17 1433

## 2018-01-18 ENCOUNTER — Ambulatory Visit (INDEPENDENT_AMBULATORY_CARE_PROVIDER_SITE_OTHER)
Admission: RE | Admit: 2018-01-18 | Discharge: 2018-01-18 | Disposition: A | Discharge status: Home / Self Care | Payer: Medicare Other | Source: Ambulatory Visit | Attending: Acute Care | Admitting: Acute Care

## 2018-01-18 DIAGNOSIS — F17210 Nicotine dependence, cigarettes, uncomplicated: Secondary | ICD-10-CM | POA: Diagnosis not present

## 2018-01-23 ENCOUNTER — Other Ambulatory Visit: Payer: Self-pay | Admitting: Acute Care

## 2018-01-23 DIAGNOSIS — F17210 Nicotine dependence, cigarettes, uncomplicated: Secondary | ICD-10-CM

## 2018-01-23 DIAGNOSIS — Z122 Encounter for screening for malignant neoplasm of respiratory organs: Secondary | ICD-10-CM

## 2018-02-19 ENCOUNTER — Ambulatory Visit (INDEPENDENT_AMBULATORY_CARE_PROVIDER_SITE_OTHER): Payer: Medicare Other | Admitting: Orthopaedic Surgery

## 2018-02-19 ENCOUNTER — Ambulatory Visit (INDEPENDENT_AMBULATORY_CARE_PROVIDER_SITE_OTHER): Payer: Medicare Other

## 2018-02-19 ENCOUNTER — Encounter (INDEPENDENT_AMBULATORY_CARE_PROVIDER_SITE_OTHER): Payer: Self-pay | Admitting: Orthopaedic Surgery

## 2018-02-19 DIAGNOSIS — M25562 Pain in left knee: Secondary | ICD-10-CM

## 2018-02-19 DIAGNOSIS — G8929 Other chronic pain: Secondary | ICD-10-CM

## 2018-02-19 MED ORDER — LIDOCAINE HCL 1 % IJ SOLN
3.0000 mL | INTRAMUSCULAR | Status: AC | PRN
  Administered 2018-02-19: 3 mL

## 2018-02-19 MED ORDER — METHYLPREDNISOLONE ACETATE 40 MG/ML IJ SUSP
40.0000 mg | INTRAMUSCULAR | Status: AC | PRN
  Administered 2018-02-19: 40 mg via INTRA_ARTICULAR

## 2018-02-19 NOTE — Progress Notes (Signed)
Office Visit Note   Patient: Katie Gaines           Date of Birth: [birth year]           MRN: [id number] Visit Date: 02/19/2018              Requested by: Chesley Noon, MD [place], [place] [id number] PCP: Chesley Noon, MD   Assessment & Plan: Visit Diagnoses:  1. Chronic pain of left knee     Plan: I do feel her knee pain and her peroneal tendon issues are 2 separate things.  She understands that she does not need any type of specific intervention for her left knee.  I do feel that an intra-articular steroid injection can calm down some likely synovitis in her knee joint itself and some of her knee pain may be just from walking different as she is dealing with peroneal nerve issues.  All questions concerns were answered and addressed.  She did wish to have a steroid injection in her left knee and she tolerated this well.  I expect the risk minutes of these types of injections.  Again I do not feel that she needs any other intervention for her left knee from my perspective.  Follow-up will be as needed.  Follow-Up Instructions: Return if symptoms worsen or fail to improve.   Orders:  Orders Placed This Encounter  Procedures  . Large Joint Inj  . XR Knee 1-2 Views Left   No orders of the defined types were placed in this encounter.     Procedures: Large Joint Inj: L knee on 02/19/2018 10:51 AM Indications: diagnostic evaluation and pain Details: 22 G 1.5 in needle, superolateral approach  Arthrogram: No  Medications: 3 mL lidocaine 1 %; 40 mg methylPREDNISolone acetate 40 MG/ML Outcome: tolerated well, no immediate complications Procedure, treatment alternatives, risks and benefits explained, specific risks discussed. Consent was given by the patient. Immediately prior to procedure a time out was called to verify the correct patient, procedure, equipment, support staff and site/side marked as required. Patient was prepped and draped in the usual sterile  fashion.       Clinical Data: No additional findings.   Subjective: Chief Complaint  Patient presents with  . Left Knee - Pain  The patient comes in today for second opinion as relates to her left knee.  Is not truly a second opinion as it is an opinion as it relates to left knee and left leg pain.  She is actually been extensively worked up for compression of her peroneal nerves on that leg.  She is seen by Dr. Kary Kos from neurosurgery for this.  An MRI has been obtained of her left knee is here for my review and we are going to obtain plain films of her knee today..  She does get occasional deep left knee pain but more of her pain but most of her pain radiates down the leg.,  She denies any specific injury.  She denies any locking catching or instability of her left knee.    HPI  Review of Systems She currently denies any headache, chest pain, shortness of breath, fever, chills, nausea, vomiting.  Objective: Vital Signs: There were no vitals taken for this visit.  Physical Exam She is alert and oriented x3 and in no acute distress.  She is not walking with a limp or an assistive device. Ortho Exam Examination of her left knee shows full range of motion  of that knee with no blocks to motion.  She has slight patellofemoral crepitation and slightly laterally tracking patella.  The knee is ligamentously stable.  Her Lachman's and McMurray's exams are negative.  There is no medial or lateral joint line tenderness.  There is no effusion. Specialty Comments:  No specialty comments available.  Imaging: Xr Knee 1-2 Views Left  Result Date: 02/19/2018 2 views of left knee shows no acute findings.  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  PMFS History: Patient Active Problem List   Diagnosis Date Noted  . Obstruction of bile duct 07/19/2011  . Abnormal  radiographic examination 07/19/2011  . IRON DEFICIENCY 09/10/2009   Past Medical History:  Diagnosis Date  . Anemia   . Anxiety disorder   . Common bile duct stone   . Hypothyroidism   . Iron deficiency   . Kidney stones   . Lymphedema of leg    leg    Family History  Problem Relation Age of Onset  . Colon polyps Father   . Diabetes Father   . Colon cancer Neg Hx   . Malignant hyperthermia Neg Hx     Past Surgical History:  Procedure Laterality Date  . APPENDECTOMY    . BACK SURGERY  1987-2007   X 10 Surgery  . CHOLECYSTECTOMY    . ENTEROSCOPY  11/10/2011   Procedure: ENTEROSCOPY;  Surgeon: Inda Castle, MD;  Location: [facility];  Service: Endoscopy;  Laterality: N/A;  . ERCP  07/19/2011   Procedure: ENDOSCOPIC RETROGRADE CHOLANGIOPANCREATOGRAPHY (ERCP);  Surgeon: Inda Castle, MD;  Location: [facility];  Service: Endoscopy;  Laterality: N/A;  . HOT HEMOSTASIS  11/10/2011   Procedure: HOT HEMOSTASIS (ARGON PLASMA COAGULATION/BICAP);  Surgeon: Inda Castle, MD;  Location: [facility];  Service: Endoscopy;  Laterality: N/A;   Social History   Occupational History  . Not on file  Tobacco Use  . Smoking status: Current Every Day Smoker    Packs/day: 1.00    Years: 35.00    Pack years: 35.00    Types: Cigarettes  . Smokeless tobacco: Never Used  . Tobacco comment: Smokes 1/2 ppd now  Substance and Sexual Activity  . Alcohol use: No    Alcohol/week: 0.0 oz  . Drug use: No  . Sexual activity: Not on file

## 2018-09-07 ENCOUNTER — Encounter (HOSPITAL_COMMUNITY): Payer: Self-pay | Admitting: Emergency Medicine

## 2018-09-07 ENCOUNTER — Other Ambulatory Visit: Payer: Self-pay

## 2018-09-07 ENCOUNTER — Emergency Department (HOSPITAL_COMMUNITY)
Admission: EM | Admit: 2018-09-07 | Discharge: 2018-09-07 | Disposition: A | Discharge status: Home / Self Care | Payer: Medicare Other | Attending: Emergency Medicine | Admitting: Emergency Medicine

## 2018-09-07 ENCOUNTER — Emergency Department (HOSPITAL_COMMUNITY): Payer: Medicare Other

## 2018-09-07 DIAGNOSIS — Y999 Unspecified external cause status: Secondary | ICD-10-CM | POA: Diagnosis not present

## 2018-09-07 DIAGNOSIS — F17210 Nicotine dependence, cigarettes, uncomplicated: Secondary | ICD-10-CM | POA: Insufficient documentation

## 2018-09-07 DIAGNOSIS — Y939 Activity, unspecified: Secondary | ICD-10-CM | POA: Insufficient documentation

## 2018-09-07 DIAGNOSIS — E039 Hypothyroidism, unspecified: Secondary | ICD-10-CM | POA: Diagnosis not present

## 2018-09-07 DIAGNOSIS — W08XXXA Fall from other furniture, initial encounter: Secondary | ICD-10-CM | POA: Diagnosis not present

## 2018-09-07 DIAGNOSIS — S7001XA Contusion of right hip, initial encounter: Secondary | ICD-10-CM | POA: Insufficient documentation

## 2018-09-07 DIAGNOSIS — S52501A Unspecified fracture of the lower end of right radius, initial encounter for closed fracture: Secondary | ICD-10-CM | POA: Diagnosis not present

## 2018-09-07 DIAGNOSIS — Z79899 Other long term (current) drug therapy: Secondary | ICD-10-CM | POA: Insufficient documentation

## 2018-09-07 DIAGNOSIS — Y92002 Bathroom of unspecified non-institutional (private) residence single-family (private) house as the place of occurrence of the external cause: Secondary | ICD-10-CM | POA: Insufficient documentation

## 2018-09-07 DIAGNOSIS — W19XXXA Unspecified fall, initial encounter: Secondary | ICD-10-CM

## 2018-09-07 DIAGNOSIS — S6991XA Unspecified injury of right wrist, hand and finger(s), initial encounter: Secondary | ICD-10-CM | POA: Diagnosis present

## 2018-09-07 MED ORDER — OXYCODONE-ACETAMINOPHEN 5-325 MG PO TABS: 2.0000 | ORAL_TABLET | ORAL | 0 refills | Status: AC | PRN

## 2018-09-07 MED ORDER — OXYCODONE-ACETAMINOPHEN 5-325 MG PO TABS: 2.0000 | ORAL_TABLET | ORAL | 0 refills | Status: DC | PRN

## 2018-09-07 NOTE — ED Triage Notes (Signed)
Pt reports fall from sitting on her sink, she reports hitting her head w/o LOC, right arm pain with wrist swelling. NAD.

## 2018-09-07 NOTE — ED Notes (Signed)
Pt. Transported to xray 

## 2018-09-07 NOTE — Discharge Instructions (Addendum)
I have provided medication to help with your pain, please take this medication as directed. This medication cannot be combined with fentanyl, we have discussed this during your visit. Please follow up with Dr. Apolonio Schneiders in 1 week to further evaluate your radius fracture.

## 2018-09-07 NOTE — ED Provider Notes (Signed)
[facility] Provider Note   CSN: [id number] Arrival date & time: 09/07/18  8299     History   Chief Complaint Chief Complaint  Patient presents with  . Fall  . Arm Pain    HPI Katie Gaines is a 63 y.o. female.  63 y.o female with a PMH of Anemia and hypothyroidism presents to the ED s/p fall x yesterday. Patient reports she was sitting on her sink when she turned and fell on the tile floor landing directly on her right arm and striking her head on her forearm and concrete. She reports pain to the right forearm, right wrist and right hip. She also reports some pain to her right head but no LOC. She has tried placing ice to the area but reports swelling has increase to right forearm. She is currently on an extensive pain regimen for chronic back pain. She denies any headache, weakness or other complaints.      Past Medical History:  Diagnosis Date  . Anemia   . Anxiety disorder   . Common bile duct stone   . Hypothyroidism   . Iron deficiency   . Kidney stones   . Lymphedema of leg    leg    Patient Active Problem List   Diagnosis Date Noted  . Obstruction of bile duct 07/19/2011  . Abnormal radiographic examination 07/19/2011  . IRON DEFICIENCY 09/10/2009    Past Surgical History:  Procedure Laterality Date  . APPENDECTOMY    . BACK SURGERY  1987-2007   X 10 Surgery  . CHOLECYSTECTOMY    . ENTEROSCOPY  11/10/2011   Procedure: ENTEROSCOPY;  Surgeon: Inda Castle, MD;  Location: [facility];  Service: Endoscopy;  Laterality: N/A;  . ERCP  07/19/2011   Procedure: ENDOSCOPIC RETROGRADE CHOLANGIOPANCREATOGRAPHY (ERCP);  Surgeon: Inda Castle, MD;  Location: [facility];  Service: Endoscopy;  Laterality: N/A;  . HOT HEMOSTASIS  11/10/2011   Procedure: HOT HEMOSTASIS (ARGON PLASMA COAGULATION/BICAP);  Surgeon: Inda Castle, MD;  Location: [facility];  Service: Endoscopy;  Laterality: N/A;     OB History   No  obstetric history on file.      Home Medications    Prior to Admission medications   Medication Sig Start Date End Date Taking? Authorizing Provider  amitriptyline (ELAVIL) 25 MG tablet TAKE 1 TABLET(25 MG) BY MOUTH AT BEDTIME 11/06/17   [provider]  amLODipine (NORVASC) 5 MG tablet  01/02/18   [provider]  aspirin-acetaminophen-caffeine (EXCEDRIN MIGRAINE) 385 139 8121 MG per tablet Take 1 tablet by mouth every 6 (six) hours as needed. As needed.     [provider]  fentaNYL (DURAGESIC - DOSED MCG/HR) 50 MCG/HR  02/06/18   [provider]  gabapentin (NEURONTIN) 300 MG capsule Take 600 mg by mouth 3 (three) times daily. 1-2 capsules at bedtime    [provider]  levothyroxine (SYNTHROID, LEVOTHROID) 150 MCG tablet Take 137 mcg by mouth daily. Take 1 tablet by mouth on Tuesday, Thursday, Saturday (on Monday Wednesday, Friday take 175 mcg) Sunday, do not take any synthroid.    [provider]  levothyroxine (SYNTHROID, LEVOTHROID) 175 MCG tablet Take 175 mcg by mouth. Take 1 tablet by mouth on Monday, Wednesday, Friday (take 150 mg on Tuesday, Thursday, Saturday) do not take any synthroid on Sunday     [provider]  morphine (MS CONTIN) 60 MG 12 hr tablet Take 60 mg by mouth 3 (three) times daily.  [provider]  olmesartan-hydrochlorothiazide (BENICAR HCT) 40-25 MG per tablet Take 1 tablet by mouth once.    [provider]  oxyCODONE-acetaminophen (PERCOCET/ROXICET) 5-325 MG tablet Take 2 tablets by mouth every 4 (four) hours as needed for up to 3 days for severe pain. 09/07/18 09/10/18  Janeece Fitting, PA-C  SYNTHROID 112 MCG tablet  01/27/18   [provider]    Family History Family History  Problem Relation Age of Onset  . Colon polyps Father   . Diabetes Father   . Colon cancer Neg Hx   . Malignant hyperthermia Neg Hx     Social History Social History   Tobacco Use  . Smoking  status: Current Every Day Smoker    Packs/day: 1.00    Years: 35.00    Pack years: 35.00    Types: Cigarettes  . Smokeless tobacco: Never Used  . Tobacco comment: Smokes 1/2 ppd now  Substance Use Topics  . Alcohol use: No    Alcohol/week: 0.0 standard drinks  . Drug use: No     Allergies   Patient has no known allergies.   Review of Systems Review of Systems  Respiratory: Negative for shortness of breath.   Cardiovascular: Negative for chest pain.  Musculoskeletal: Positive for arthralgias and joint swelling.  Skin: Positive for color change. Negative for wound.  Neurological: Negative for dizziness and headaches.     Physical Exam Updated Vital Signs BP (!) 135/95 (BP Location: Right Arm)   Pulse 67   Temp 97.6 F (36.4 C) (Oral)   Resp 17   SpO2 98%   Physical Exam Vitals signs and nursing note reviewed.  Constitutional:      General: She is not in acute distress.    Appearance: She is well-developed.  HENT:     Head: Normocephalic and atraumatic.     Mouth/Throat:     Pharynx: No oropharyngeal exudate.  Eyes:     Pupils: Pupils are equal, round, and reactive to light.  Neck:     Musculoskeletal: Normal range of motion.  Cardiovascular:     Rate and Rhythm: Regular rhythm.     Heart sounds: Normal heart sounds.  Pulmonary:     Effort: Pulmonary effort is normal. No respiratory distress.     Breath sounds: Normal breath sounds.  Abdominal:     General: Bowel sounds are normal. There is no distension.     Palpations: Abdomen is soft.     Tenderness: There is no abdominal tenderness.  Musculoskeletal:        General: No deformity.     Right shoulder: She exhibits normal range of motion, no tenderness and no bony tenderness.     Right elbow: She exhibits normal range of motion, no swelling and no effusion.     Right wrist: She exhibits decreased range of motion, tenderness, bony tenderness and swelling. She exhibits no effusion, no crepitus, no deformity  and no laceration.     Right forearm: She exhibits tenderness, swelling and edema. She exhibits no bony tenderness, no deformity and no laceration.     Right lower leg: No edema.     Left lower leg: No edema.       Legs:     Comments: Pulses present, capillary refill intact. Strength is decrease 4/5 due to pain, decrease ROM.   Skin:    General: Skin is warm and dry.  Neurological:     Mental Status: She is alert and oriented to person, place,  and time.      ED Treatments / Results  Labs (all labs ordered are listed, but only abnormal results are displayed) Labs Reviewed - No data to display  EKG None  Radiology Dg Forearm Right  Result Date: 09/07/2018 CLINICAL DATA:  [place].  Right forearm pain. EXAM: RIGHT FOREARM - 2 VIEW COMPARISON:  None. FINDINGS: The wrist and elbow joints are maintained. No acute forearm fracture. IMPRESSION: No acute forearm fracture Electronically Signed   By: Marijo Sanes M.D.   On: 09/07/2018 10:47   Dg Wrist Complete Right  Result Date: 09/07/2018 CLINICAL DATA:  [place] and injured right wrist. EXAM: RIGHT WRIST - COMPLETE 3+ VIEW COMPARISON:  None. FINDINGS: The joint spaces are maintained. Minimal degenerative changes. On the lateral film there appears to be dorsal cortical fracture of the distal radius although I can confirm this on the other images. Recommend clinical correlation with exact area of patient's pain and tenderness. No carpal bone fractures are identified. The metacarpal bones appear intact. IMPRESSION: Suspect dorsal cortical fracture of the distal radius on the lateral film. Recommend clinical correlation. Electronically Signed   By: Marijo Sanes M.D.   On: 09/07/2018 10:46   Dg Humerus Right  Result Date: 09/07/2018 CLINICAL DATA:  [place].  Right arm pain. EXAM: RIGHT HUMERUS - 2+ VIEW COMPARISON:  None. FINDINGS: The shoulder and elbow joints are maintained. No acute fracture is identified. The visualized right ribs are intact. Suspect  remote upper right rib fracture. IMPRESSION: No acute bony findings. Electronically Signed   By: Marijo Sanes M.D.   On: 09/07/2018 10:46    Procedures Procedures (including critical care time)  Medications Ordered in ED Medications - No data to display   Initial Impression / Assessment and Plan / ED Course  I have reviewed the triage vital signs and the nursing notes.  Pertinent labs & imaging results that were available during my care of the patient were reviewed by me and considered in my medical decision making (see chart for details).     Patient presents s/p fall yesterday at home. No LOC, no lacerations. Complains of pain in right wrist, right hip and right hand. CT canadian head no imaging at this time. DG wrist showed:  Suspect dorsal cortical fracture of the distal radius on the lateral  film. Recommend clinical correlation.     11:52 AM Spoke to Silvestre Gunner per Dr. Lequita Asal RN suggestion, he advised placing patient in velcro splint along with follow up in 1 week in office. Patient understands and agrees with plan. Will also provided her with pain medication. I have viewed in [place] substance database, patient has a current prescription for fentanyl, I have instructed her that she cannot take this medication in combination with her Percocet.  She understands and agrees with management.  She is to see Dr. Antionette Poles in 1 week in office.  Patient well-appearing no other complaints at this time.  Return precautions provided.    Final Clinical Impressions(s) / ED Diagnoses   Final diagnoses:  Fall, initial encounter  Closed fracture of distal end of right radius, unspecified fracture morphology, initial encounter    ED Discharge Orders         Ordered    oxyCODONE-acetaminophen (PERCOCET/ROXICET) 5-325 MG tablet  Every 4 hours PRN     09/07/18 1213           Janeece Fitting, PA-C 09/07/18 1216    Isla Pence, MD 09/07/18 1313

## 2018-09-07 NOTE — Progress Notes (Signed)
Orthopedic Tech Progress Note Patient Details:  Katie Gaines October 27, 1955 [id number]  Ortho Devices Type of Ortho Device: Velcro wrist splint Ortho Device/Splint Location: rue Ortho Device/Splint Interventions: Ordered, Application, Adjustment   Post Interventions Patient Tolerated: Well Instructions Provided: Care of device, Adjustment of device   Karolee Stamps 09/07/2018, 12:08 PM

## 2018-09-08 HISTORY — PX: ESOPHAGOGASTRODUODENOSCOPY: SHX1529

## 2018-09-13 DIAGNOSIS — S6991XA Unspecified injury of right wrist, hand and finger(s), initial encounter: Secondary | ICD-10-CM | POA: Insufficient documentation

## 2018-10-04 DIAGNOSIS — S52509A Unspecified fracture of the lower end of unspecified radius, initial encounter for closed fracture: Secondary | ICD-10-CM | POA: Insufficient documentation

## 2019-02-15 ENCOUNTER — Other Ambulatory Visit: Payer: Self-pay | Admitting: *Deleted

## 2019-02-15 DIAGNOSIS — F17210 Nicotine dependence, cigarettes, uncomplicated: Secondary | ICD-10-CM

## 2019-02-15 DIAGNOSIS — Z87891 Personal history of nicotine dependence: Secondary | ICD-10-CM

## 2019-02-15 DIAGNOSIS — Z122 Encounter for screening for malignant neoplasm of respiratory organs: Secondary | ICD-10-CM

## 2019-03-06 DIAGNOSIS — K580 Irritable bowel syndrome with diarrhea: Secondary | ICD-10-CM | POA: Insufficient documentation

## 2019-04-03 ENCOUNTER — Other Ambulatory Visit: Payer: Self-pay

## 2019-04-03 ENCOUNTER — Ambulatory Visit (INDEPENDENT_AMBULATORY_CARE_PROVIDER_SITE_OTHER)
Admission: RE | Admit: 2019-04-03 | Discharge: 2019-04-03 | Disposition: A | Discharge status: Home / Self Care | Payer: Medicare Other | Source: Ambulatory Visit | Attending: Acute Care | Admitting: Acute Care

## 2019-04-03 ENCOUNTER — Encounter (INDEPENDENT_AMBULATORY_CARE_PROVIDER_SITE_OTHER): Payer: Self-pay

## 2019-04-03 DIAGNOSIS — Z87891 Personal history of nicotine dependence: Secondary | ICD-10-CM | POA: Diagnosis not present

## 2019-04-03 DIAGNOSIS — F17210 Nicotine dependence, cigarettes, uncomplicated: Secondary | ICD-10-CM | POA: Diagnosis not present

## 2019-04-03 DIAGNOSIS — Z122 Encounter for screening for malignant neoplasm of respiratory organs: Secondary | ICD-10-CM

## 2019-04-05 ENCOUNTER — Telehealth: Payer: Self-pay | Admitting: Acute Care

## 2019-04-05 DIAGNOSIS — F17210 Nicotine dependence, cigarettes, uncomplicated: Secondary | ICD-10-CM

## 2019-04-05 DIAGNOSIS — Z87891 Personal history of nicotine dependence: Secondary | ICD-10-CM

## 2019-04-05 DIAGNOSIS — Z122 Encounter for screening for malignant neoplasm of respiratory organs: Secondary | ICD-10-CM

## 2019-04-05 NOTE — Telephone Encounter (Signed)
Call her back @ [phone].[name]

## 2019-04-05 NOTE — Telephone Encounter (Signed)
Pt informed of CT results per Sarah Groce, NP.  PT verbalized understanding.  Copy sent to PCP.  Order placed for 1 yr f/u CT.  

## 2019-06-17 DIAGNOSIS — E559 Vitamin D deficiency, unspecified: Secondary | ICD-10-CM | POA: Insufficient documentation

## 2019-06-17 DIAGNOSIS — K219 Gastro-esophageal reflux disease without esophagitis: Secondary | ICD-10-CM | POA: Insufficient documentation

## 2019-08-13 ENCOUNTER — Other Ambulatory Visit: Payer: Self-pay | Admitting: Neurosurgery

## 2019-08-19 NOTE — Progress Notes (Signed)
[id number] - [place], [place] [address] [place] [address] Phone: [phone] Fax: [place], [place] [address] Phone: [phone] Fax: [phone]      Your procedure is scheduled on Monday 08/26/2019.  Report to [facility] Main Entrance "A" at 09:00 A.M., and check in at the Admitting office.  Call this number if you have problems the morning of surgery:  [phone]  Call [phone] if you have any questions prior to your surgery date Monday-Friday 8am-4pm    Remember:  Do not eat or drink after midnight the night before your surgery     Take these medicines the morning of surgery with A SIP OF WATER:  Amlodipine (Norvasc) Gabapentin (Neurontin) Pantoprazole (Protonix) Synthroid   7 days prior to surgery STOP taking any Aspirin (unless otherwise instructed by your surgeon), Aleve, Naproxen, Ibuprofen, Motrin, Advil, Goody's, BC's, all herbal medications, fish oil, and all vitamins.    The Morning of Surgery  Do not wear jewelry, make-up or nail polish.  Do not wear lotions, powders, perfumes, or deodorant  Do not shave 48 hours prior to surgery.  Do not bring valuables to the hospital.  [facility] is not responsible for any belongings or valuables.  If you are a smoker, DO NOT Smoke 24 hours prior to surgery  If you wear a CPAP at night please bring your mask, tubing, and machine the morning of surgery   Remember that you must have someone to transport you home after your surgery, and remain with you for 24 hours if you are discharged the same day.   Please bring cases for contacts, glasses, hearing aids, dentures or bridgework because it cannot be worn into surgery.    Leave your suitcase in the car.  After surgery it may be brought to your  room.  For patients admitted to the hospital, discharge time will be determined by your treatment team.  Patients discharged the day of surgery will not be allowed to drive home.    Special instructions:   [place]- Preparing For Surgery  Before surgery, you can play an important role. Because skin is not sterile, your skin needs to be as free of germs as possible. You can reduce the number of germs on your skin by washing with CHG (chlorahexidine gluconate) Soap before surgery.  CHG is an antiseptic cleaner which kills germs and bonds with the skin to continue killing germs even after washing.    Oral Hygiene is also important to reduce your risk of infection.  Remember - BRUSH YOUR TEETH THE MORNING OF SURGERY WITH YOUR REGULAR TOOTHPASTE  Please do not use if you have an allergy to CHG or antibacterial soaps. If your skin becomes reddened/irritated stop using the CHG.  Do not shave (including legs and underarms) for at least 48 hours prior to first CHG shower. It is OK to shave your face.  Please follow these instructions carefully.   1. Shower the NIGHT BEFORE SURGERY and the MORNING OF SURGERY with CHG Soap.   2. If you chose to wash your hair, wash your hair first as usual with your normal shampoo.  3. After you shampoo, rinse your hair and body thoroughly to remove the shampoo.  4. Use CHG as you would any other liquid soap. You can apply  CHG directly to the skin and wash gently with a scrungie or a clean washcloth.   5. Apply the CHG Soap to your body ONLY FROM THE NECK DOWN.  Do not use on open wounds or open sores. Avoid contact with your eyes, ears, mouth and genitals (private parts). Wash Face and genitals (private parts)  with your normal soap.   6. Wash thoroughly, paying special attention to the area where your surgery will be performed.  7. Thoroughly rinse your body with warm water from the neck down.  8. DO NOT shower/wash with your normal soap after using and  rinsing off the CHG Soap.  9. Pat yourself dry with a CLEAN TOWEL.  10. Wear CLEAN PAJAMAS to bed the night before surgery, wear comfortable clothes the morning of surgery  11. Place CLEAN SHEETS on your bed the night of your first shower and DO NOT SLEEP WITH PETS.    Day of Surgery:  Please shower the morning of surgery with the CHG soap Do not apply any deodorants/lotions. Please wear clean clothes to the hospital/surgery center.   Remember to brush your teeth WITH YOUR REGULAR TOOTHPASTE.   Please read over the following fact sheets that you were given.

## 2019-08-20 ENCOUNTER — Other Ambulatory Visit: Payer: Self-pay

## 2019-08-20 ENCOUNTER — Encounter (HOSPITAL_COMMUNITY): Payer: Self-pay

## 2019-08-20 ENCOUNTER — Encounter (HOSPITAL_COMMUNITY)
Admission: RE | Admit: 2019-08-20 | Discharge: 2019-08-20 | Disposition: A | Discharge status: Home / Self Care | Payer: Medicare Other | Source: Ambulatory Visit | Attending: Neurosurgery | Admitting: Neurosurgery

## 2019-08-20 DIAGNOSIS — Z01818 Encounter for other preprocedural examination: Secondary | ICD-10-CM | POA: Insufficient documentation

## 2019-08-20 HISTORY — DX: Essential (primary) hypertension: I10

## 2019-08-20 LAB — CBC
HCT: 50.7 % — ABNORMAL HIGH (ref 36.0–46.0)
Hemoglobin: 16.3 g/dL — ABNORMAL HIGH (ref 12.0–15.0)
MCH: 30.2 pg (ref 26.0–34.0)
MCHC: 32.1 g/dL (ref 30.0–36.0)
MCV: 94.1 fL (ref 80.0–100.0)
Platelets: 238 10*3/uL (ref 150–400)
RBC: 5.39 MIL/uL — ABNORMAL HIGH (ref 3.87–5.11)
RDW: 13.2 % (ref 11.5–15.5)
WBC: 6.4 10*3/uL (ref 4.0–10.5)
nRBC: 0 % (ref 0.0–0.2)

## 2019-08-20 LAB — BASIC METABOLIC PANEL
Anion gap: 9 (ref 5–15)
BUN: 13 mg/dL (ref 8–23)
CO2: 29 mmol/L (ref 22–32)
Calcium: 9.3 mg/dL (ref 8.9–10.3)
Chloride: 106 mmol/L (ref 98–111)
Creatinine, Ser: 0.72 mg/dL (ref 0.44–1.00)
GFR calc Af Amer: >60 mL/min (ref >60)
GFR calc non Af Amer: >60 mL/min (ref >60)
Glucose, Bld: 93 mg/dL (ref 70–99)
Potassium: 3.8 mmol/L (ref 3.5–5.1)
Sodium: 144 mmol/L (ref 135–145)

## 2019-08-20 LAB — TYPE AND SCREEN
ABO/RH(D): O POS
Antibody Screen: NEGATIVE

## 2019-08-20 LAB — SURGICAL PCR SCREEN
MRSA, PCR: NEGATIVE
Staphylococcus aureus: NEGATIVE

## 2019-08-20 NOTE — Progress Notes (Signed)
PCP - Dr. Anastasia Pall Cardiologist - patient denies  PPM/ICD - n/a Device Orders -  Rep Notified -   Chest x-ray - n/a EKG - 08/20/19 Stress Test - patient denies ECHO - patient denies Cardiac Cath - patient denies  Sleep Study - n/a, negative stop bang CPAP -   Fasting Blood Sugar - n/a Checks Blood Sugar _____ times a day  Blood Thinner Instructions: n/a Aspirin Instructions:n/a  ERAS Protcol - n/a PRE-SURGERY Ensure or G2-   COVID TEST- 08/22/19   Anesthesia review: n/a  Patient denies shortness of breath, fever, cough and chest pain at PAT appointment   All instructions explained to the patient, with a verbal understanding of the material. Patient agrees to go over the instructions while at home for a better understanding. Patient also instructed to self quarantine after being tested for COVID-19. The opportunity to ask questions was provided.

## 2019-08-22 ENCOUNTER — Other Ambulatory Visit (HOSPITAL_COMMUNITY): Payer: Medicare Other

## 2019-08-22 ENCOUNTER — Other Ambulatory Visit (HOSPITAL_COMMUNITY)
Admission: RE | Admit: 2019-08-22 | Discharge: 2019-08-22 | Disposition: A | Discharge status: Home / Self Care | Payer: Medicare Other | Source: Ambulatory Visit | Attending: Neurosurgery | Admitting: Neurosurgery

## 2019-08-22 DIAGNOSIS — Z20822 Contact with and (suspected) exposure to covid-19: Secondary | ICD-10-CM | POA: Diagnosis not present

## 2019-08-22 DIAGNOSIS — Z01812 Encounter for preprocedural laboratory examination: Secondary | ICD-10-CM | POA: Diagnosis present

## 2019-08-23 LAB — NOVEL CORONAVIRUS, NAA (HOSP ORDER, SEND-OUT TO REF LAB; TAT 18-24 HRS): SARS-CoV-2, NAA: NOT DETECTED

## 2019-08-26 ENCOUNTER — Encounter (HOSPITAL_COMMUNITY): Payer: Self-pay | Admitting: Neurosurgery

## 2019-08-26 ENCOUNTER — Inpatient Hospital Stay (HOSPITAL_COMMUNITY): Payer: Medicare Other | Admitting: Certified Registered Nurse Anesthetist

## 2019-08-26 ENCOUNTER — Encounter (HOSPITAL_COMMUNITY)
Admission: RE | Disposition: A | Discharge status: Home / Self Care | Payer: Self-pay | Source: Home / Self Care | Attending: Neurosurgery

## 2019-08-26 ENCOUNTER — Inpatient Hospital Stay (HOSPITAL_COMMUNITY): Payer: Medicare Other

## 2019-08-26 ENCOUNTER — Observation Stay (HOSPITAL_COMMUNITY)
Admission: RE | Admit: 2019-08-26 | Discharge: 2019-08-27 | Disposition: A | Discharge status: Home / Self Care | Payer: Medicare Other | Attending: Neurosurgery | Admitting: Neurosurgery

## 2019-08-26 ENCOUNTER — Other Ambulatory Visit: Payer: Self-pay

## 2019-08-26 DIAGNOSIS — Z79899 Other long term (current) drug therapy: Secondary | ICD-10-CM | POA: Diagnosis not present

## 2019-08-26 DIAGNOSIS — Z79890 Hormone replacement therapy (postmenopausal): Secondary | ICD-10-CM | POA: Insufficient documentation

## 2019-08-26 DIAGNOSIS — Z20822 Contact with and (suspected) exposure to covid-19: Secondary | ICD-10-CM | POA: Insufficient documentation

## 2019-08-26 DIAGNOSIS — Z419 Encounter for procedure for purposes other than remedying health state, unspecified: Secondary | ICD-10-CM

## 2019-08-26 DIAGNOSIS — F419 Anxiety disorder, unspecified: Secondary | ICD-10-CM | POA: Insufficient documentation

## 2019-08-26 DIAGNOSIS — Z981 Arthrodesis status: Secondary | ICD-10-CM | POA: Diagnosis not present

## 2019-08-26 DIAGNOSIS — I10 Essential (primary) hypertension: Secondary | ICD-10-CM | POA: Insufficient documentation

## 2019-08-26 DIAGNOSIS — M4856XA Collapsed vertebra, not elsewhere classified, lumbar region, initial encounter for fracture: Secondary | ICD-10-CM | POA: Diagnosis not present

## 2019-08-26 DIAGNOSIS — F17210 Nicotine dependence, cigarettes, uncomplicated: Secondary | ICD-10-CM | POA: Insufficient documentation

## 2019-08-26 DIAGNOSIS — M5136 Other intervertebral disc degeneration, lumbar region: Principal | ICD-10-CM | POA: Insufficient documentation

## 2019-08-26 DIAGNOSIS — E039 Hypothyroidism, unspecified: Secondary | ICD-10-CM | POA: Insufficient documentation

## 2019-08-26 DIAGNOSIS — M48062 Spinal stenosis, lumbar region with neurogenic claudication: Secondary | ICD-10-CM | POA: Diagnosis not present

## 2019-08-26 HISTORY — DX: Personal history of urinary calculi: Z87.442

## 2019-08-26 HISTORY — PX: ANTERIOR LAT LUMBAR FUSION: SHX1168

## 2019-08-26 LAB — CBC
HCT: 44.9 % (ref 36.0–46.0)
Hemoglobin: 14.9 g/dL (ref 12.0–15.0)
MCH: 30.8 pg (ref 26.0–34.0)
MCHC: 33.2 g/dL (ref 30.0–36.0)
MCV: 93 fL (ref 80.0–100.0)
Platelets: 198 10*3/uL (ref 150–400)
RBC: 4.83 MIL/uL (ref 3.87–5.11)
RDW: 13.2 % (ref 11.5–15.5)
WBC: 5.6 10*3/uL (ref 4.0–10.5)
nRBC: 0 % (ref 0.0–0.2)

## 2019-08-26 LAB — RESPIRATORY PANEL BY RT PCR (FLU A&B, COVID)
Influenza A by PCR: NEGATIVE
Influenza B by PCR: NEGATIVE
SARS Coronavirus 2 by RT PCR: NEGATIVE

## 2019-08-26 SURGERY — ANTERIOR LATERAL LUMBAR FUSION 1 LEVEL
Anesthesia: General | Site: Spine Lumbar

## 2019-08-26 MED ORDER — CEFAZOLIN SODIUM-DEXTROSE 2-4 GM/100ML-% IV SOLN
2.0000 g | INTRAVENOUS | Status: AC
  Administered 2019-08-26: 2 g via INTRAVENOUS

## 2019-08-26 MED ORDER — LEVOTHYROXINE SODIUM 112 MCG PO TABS
112.0000 ug | ORAL_TABLET | Freq: Every day | ORAL | Status: DC
  Administered 2019-08-27: 05:00:00 112 ug via ORAL
  Filled 2019-08-26: qty 1

## 2019-08-26 MED ORDER — SODIUM CHLORIDE 0.9% FLUSH: 3.0000 mL | INTRAVENOUS | Status: DC | PRN

## 2019-08-26 MED ORDER — CHLORHEXIDINE GLUCONATE CLOTH 2 % EX PADS: 6.0000 | MEDICATED_PAD | Freq: Once | CUTANEOUS | Status: DC

## 2019-08-26 MED ORDER — FENTANYL 50 MCG/HR TD PT72
1.0000 | MEDICATED_PATCH | TRANSDERMAL | Status: DC
  Administered 2019-08-26: 17:00:00 1 via TRANSDERMAL
  Filled 2019-08-26: qty 1

## 2019-08-26 MED ORDER — MIDAZOLAM HCL 5 MG/5ML IJ SOLN
INTRAMUSCULAR | Status: DC | PRN
  Administered 2019-08-26: 2 mg via INTRAVENOUS

## 2019-08-26 MED ORDER — SODIUM CHLORIDE 0.9 % IV SOLN: INTRAVENOUS | Status: DC | PRN

## 2019-08-26 MED ORDER — PHENYLEPHRINE HCL-NACL 10-0.9 MG/250ML-% IV SOLN
INTRAVENOUS | Status: DC | PRN
  Administered 2019-08-26: 25 ug/min via INTRAVENOUS

## 2019-08-26 MED ORDER — MENTHOL 3 MG MT LOZG: 1.0000 | LOZENGE | OROMUCOSAL | Status: DC | PRN

## 2019-08-26 MED ORDER — ONDANSETRON HCL 4 MG PO TABS: 4.0000 mg | ORAL_TABLET | Freq: Four times a day (QID) | ORAL | Status: DC | PRN

## 2019-08-26 MED ORDER — LACTATED RINGERS IV SOLN: INTRAVENOUS | Status: DC

## 2019-08-26 MED ORDER — LACTATED RINGERS IV SOLN: INTRAVENOUS | Status: DC | PRN

## 2019-08-26 MED ORDER — PROPOFOL 500 MG/50ML IV EMUL
INTRAVENOUS | Status: DC | PRN
  Administered 2019-08-26: 50 ug/kg/min via INTRAVENOUS

## 2019-08-26 MED ORDER — VITAMIN D (ERGOCALCIFEROL) 1.25 MG (50000 UNIT) PO CAPS: 50000.0000 [IU] | ORAL_CAPSULE | ORAL | Status: DC

## 2019-08-26 MED ORDER — CEFAZOLIN SODIUM-DEXTROSE 2-4 GM/100ML-% IV SOLN
INTRAVENOUS | Status: AC
  Filled 2019-08-26: qty 100

## 2019-08-26 MED ORDER — AMLODIPINE BESYLATE 5 MG PO TABS
5.0000 mg | ORAL_TABLET | Freq: Every day | ORAL | Status: DC
  Administered 2019-08-26: 17:00:00 5 mg via ORAL
  Filled 2019-08-26: qty 1

## 2019-08-26 MED ORDER — EPHEDRINE 5 MG/ML INJ
INTRAVENOUS | Status: AC
  Filled 2019-08-26: qty 10

## 2019-08-26 MED ORDER — 0.9 % SODIUM CHLORIDE (POUR BTL) OPTIME
TOPICAL | Status: DC | PRN
  Administered 2019-08-26: 12:00:00 1000 mL

## 2019-08-26 MED ORDER — SODIUM CHLORIDE 0.9 % IV SOLN: 250.0000 mL | INTRAVENOUS | Status: DC

## 2019-08-26 MED ORDER — PHENOL 1.4 % MT LIQD: 1.0000 | OROMUCOSAL | Status: DC | PRN

## 2019-08-26 MED ORDER — PROPOFOL 10 MG/ML IV BOLUS
INTRAVENOUS | Status: DC | PRN
  Administered 2019-08-26: 130 mg via INTRAVENOUS

## 2019-08-26 MED ORDER — OXYCODONE HCL 5 MG PO TABS
10.0000 mg | ORAL_TABLET | ORAL | Status: DC | PRN
  Administered 2019-08-26 – 2019-08-27 (×4): 10 mg via ORAL
  Filled 2019-08-26 (×4): qty 2

## 2019-08-26 MED ORDER — CYCLOBENZAPRINE HCL 10 MG PO TABS
10.0000 mg | ORAL_TABLET | Freq: Three times a day (TID) | ORAL | Status: DC | PRN
  Administered 2019-08-26: 17:00:00 10 mg via ORAL
  Filled 2019-08-26: qty 1

## 2019-08-26 MED ORDER — HYDROMORPHONE HCL 1 MG/ML IJ SOLN
INTRAMUSCULAR | Status: AC
  Filled 2019-08-26: qty 1

## 2019-08-26 MED ORDER — PROPOFOL 10 MG/ML IV BOLUS
INTRAVENOUS | Status: AC
  Filled 2019-08-26: qty 40

## 2019-08-26 MED ORDER — LIDOCAINE-EPINEPHRINE 1 %-1:100000 IJ SOLN
INTRAMUSCULAR | Status: DC | PRN
  Administered 2019-08-26: 10 mL

## 2019-08-26 MED ORDER — HYDROMORPHONE HCL 1 MG/ML IJ SOLN
0.2500 mg | INTRAMUSCULAR | Status: DC | PRN
  Administered 2019-08-26 (×2): 0.25 mg via INTRAVENOUS
  Administered 2019-08-26 (×3): 0.5 mg via INTRAVENOUS

## 2019-08-26 MED ORDER — CEFAZOLIN SODIUM-DEXTROSE 2-4 GM/100ML-% IV SOLN
2.0000 g | Freq: Three times a day (TID) | INTRAVENOUS | Status: AC
  Administered 2019-08-26 – 2019-08-27 (×2): 2 g via INTRAVENOUS
  Filled 2019-08-26 (×2): qty 100

## 2019-08-26 MED ORDER — PROMETHAZINE HCL 25 MG/ML IJ SOLN: 6.2500 mg | INTRAMUSCULAR | Status: DC | PRN

## 2019-08-26 MED ORDER — HYDROMORPHONE HCL 1 MG/ML IJ SOLN: 0.5000 mg | INTRAMUSCULAR | Status: DC | PRN

## 2019-08-26 MED ORDER — ONDANSETRON HCL 4 MG/2ML IJ SOLN
INTRAMUSCULAR | Status: DC | PRN
  Administered 2019-08-26: 4 mg via INTRAVENOUS

## 2019-08-26 MED ORDER — LIDOCAINE 2% (20 MG/ML) 5 ML SYRINGE
INTRAMUSCULAR | Status: AC
  Filled 2019-08-26: qty 5

## 2019-08-26 MED ORDER — PHENYLEPHRINE 40 MCG/ML (10ML) SYRINGE FOR IV PUSH (FOR BLOOD PRESSURE SUPPORT)
PREFILLED_SYRINGE | INTRAVENOUS | Status: DC | PRN
  Administered 2019-08-26: 80 ug via INTRAVENOUS

## 2019-08-26 MED ORDER — ONDANSETRON HCL 4 MG/2ML IJ SOLN: 4.0000 mg | Freq: Four times a day (QID) | INTRAMUSCULAR | Status: DC | PRN

## 2019-08-26 MED ORDER — OXYCODONE HCL 5 MG/5ML PO SOLN: 5.0000 mg | Freq: Once | ORAL | Status: DC | PRN

## 2019-08-26 MED ORDER — MIDAZOLAM HCL 2 MG/2ML IJ SOLN
INTRAMUSCULAR | Status: AC
  Filled 2019-08-26: qty 2

## 2019-08-26 MED ORDER — LIDOCAINE-EPINEPHRINE 1 %-1:100000 IJ SOLN
INTRAMUSCULAR | Status: AC
  Filled 2019-08-26: qty 1

## 2019-08-26 MED ORDER — ONDANSETRON HCL 4 MG/2ML IJ SOLN
INTRAMUSCULAR | Status: AC
  Filled 2019-08-26: qty 2

## 2019-08-26 MED ORDER — ALUM & MAG HYDROXIDE-SIMETH 200-200-20 MG/5ML PO SUSP: 30.0000 mL | Freq: Four times a day (QID) | ORAL | Status: DC | PRN

## 2019-08-26 MED ORDER — SUCCINYLCHOLINE CHLORIDE 200 MG/10ML IV SOSY
PREFILLED_SYRINGE | INTRAVENOUS | Status: DC | PRN
  Administered 2019-08-26: 100 mg via INTRAVENOUS

## 2019-08-26 MED ORDER — SUCCINYLCHOLINE CHLORIDE 200 MG/10ML IV SOSY
PREFILLED_SYRINGE | INTRAVENOUS | Status: AC
  Filled 2019-08-26: qty 10

## 2019-08-26 MED ORDER — LIDOCAINE 2% (20 MG/ML) 5 ML SYRINGE
INTRAMUSCULAR | Status: DC | PRN
  Administered 2019-08-26: 60 mg via INTRAVENOUS

## 2019-08-26 MED ORDER — SODIUM CHLORIDE 0.9% FLUSH
3.0000 mL | Freq: Two times a day (BID) | INTRAVENOUS | Status: DC
  Administered 2019-08-26: 21:00:00 3 mL via INTRAVENOUS

## 2019-08-26 MED ORDER — OXYCODONE HCL 5 MG PO TABS: 5.0000 mg | ORAL_TABLET | Freq: Once | ORAL | Status: DC | PRN

## 2019-08-26 MED ORDER — FENTANYL CITRATE (PF) 250 MCG/5ML IJ SOLN
INTRAMUSCULAR | Status: DC | PRN
  Administered 2019-08-26 (×6): 50 ug via INTRAVENOUS
  Administered 2019-08-26: 100 ug via INTRAVENOUS
  Administered 2019-08-26 (×2): 50 ug via INTRAVENOUS

## 2019-08-26 MED ORDER — FENTANYL CITRATE (PF) 250 MCG/5ML IJ SOLN
INTRAMUSCULAR | Status: AC
  Filled 2019-08-26: qty 5

## 2019-08-26 MED ORDER — AMITRIPTYLINE HCL 25 MG PO TABS
25.0000 mg | ORAL_TABLET | Freq: Every day | ORAL | Status: DC
  Administered 2019-08-26: 20:00:00 25 mg via ORAL
  Filled 2019-08-26: qty 1

## 2019-08-26 MED ORDER — THROMBIN 5000 UNITS EX SOLR
CUTANEOUS | Status: AC
  Filled 2019-08-26: qty 5000

## 2019-08-26 MED ORDER — ACETAMINOPHEN 325 MG PO TABS: 650.0000 mg | ORAL_TABLET | ORAL | Status: DC | PRN

## 2019-08-26 MED ORDER — THROMBIN 5000 UNITS EX SOLR: OROMUCOSAL | Status: DC | PRN

## 2019-08-26 MED ORDER — DEXAMETHASONE SODIUM PHOSPHATE 10 MG/ML IJ SOLN
INTRAMUSCULAR | Status: DC | PRN
  Administered 2019-08-26: 4 mg via INTRAVENOUS

## 2019-08-26 MED ORDER — GABAPENTIN 400 MG PO CAPS
800.0000 mg | ORAL_CAPSULE | Freq: Four times a day (QID) | ORAL | Status: DC
  Administered 2019-08-26 (×2): 800 mg via ORAL
  Filled 2019-08-26 (×2): qty 2

## 2019-08-26 MED ORDER — EPHEDRINE SULFATE-NACL 50-0.9 MG/10ML-% IV SOSY
PREFILLED_SYRINGE | INTRAVENOUS | Status: DC | PRN
  Administered 2019-08-26: 5 mg via INTRAVENOUS
  Administered 2019-08-26: 10 mg via INTRAVENOUS

## 2019-08-26 MED ORDER — PANTOPRAZOLE SODIUM 40 MG IV SOLR: 40.0000 mg | Freq: Every day | INTRAVENOUS | Status: DC

## 2019-08-26 MED ORDER — PHENYLEPHRINE 40 MCG/ML (10ML) SYRINGE FOR IV PUSH (FOR BLOOD PRESSURE SUPPORT)
PREFILLED_SYRINGE | INTRAVENOUS | Status: AC
  Filled 2019-08-26: qty 10

## 2019-08-26 MED ORDER — ACETAMINOPHEN 650 MG RE SUPP: 650.0000 mg | RECTAL | Status: DC | PRN

## 2019-08-26 MED ORDER — PANTOPRAZOLE SODIUM 40 MG PO TBEC: 40.0000 mg | DELAYED_RELEASE_TABLET | Freq: Every day | ORAL | Status: DC

## 2019-08-26 SURGICAL SUPPLY — 60 items
ADH SKN CLS APL DERMABOND .7 (GAUZE/BANDAGES/DRESSINGS) ×1
APL SKNCLS STERI-STRIP NONHPOA (GAUZE/BANDAGES/DRESSINGS) ×2
BAG DECANTER FOR FLEXI CONT (MISCELLANEOUS) ×2 IMPLANT
BATTALION LLIF ITRADISCAL SHIM (MISCELLANEOUS) ×2
BENZOIN TINCTURE PRP APPL 2/3 (GAUZE/BANDAGES/DRESSINGS) ×4 IMPLANT
BLADE CLIPPER SURG (BLADE) IMPLANT
BONE MATRIX OSTEOCEL PRO MED (Bone Implant) ×1 IMPLANT
CLIP SPRING STIM LLIF SAFEOP (CLIP) ×1 IMPLANT
COVER WAND RF STERILE (DRAPES) ×2 IMPLANT
DERMABOND ADVANCED (GAUZE/BANDAGES/DRESSINGS) ×1
DERMABOND ADVANCED .7 DNX12 (GAUZE/BANDAGES/DRESSINGS) ×2 IMPLANT
DILATOR INSULATED LLIF 8-13-18 (NEUROSURGERY SUPPLIES) ×1 IMPLANT
DRAPE C-ARM 42X72 X-RAY (DRAPES) ×2 IMPLANT
DRAPE C-ARMOR (DRAPES) ×2 IMPLANT
DRAPE LAPAROTOMY 100X72X124 (DRAPES) ×2 IMPLANT
DRAPE SURG 17X23 STRL (DRAPES) ×4 IMPLANT
DRSG OPSITE POSTOP 3X4 (GAUZE/BANDAGES/DRESSINGS) ×2 IMPLANT
ELECT KIT SAFEOP EMG/NMJ (KITS) ×2
ELECT REM PT RETURN 9FT ADLT (ELECTROSURGICAL) ×2
ELECTRODE REM PT RTRN 9FT ADLT (ELECTROSURGICAL) ×1 IMPLANT
GAUZE 4X4 16PLY RFD (DISPOSABLE) IMPLANT
GLOVE BIO SURGEON STRL SZ7 (GLOVE) IMPLANT
GLOVE BIO SURGEON STRL SZ8 (GLOVE) ×2 IMPLANT
GLOVE BIOGEL PI IND STRL 7.0 (GLOVE) IMPLANT
GLOVE BIOGEL PI INDICATOR 7.0 (GLOVE)
GLOVE EXAM NITRILE XL STR (GLOVE) IMPLANT
GLOVE INDICATOR 8.5 STRL (GLOVE) ×2 IMPLANT
GOWN STRL REUS W/ TWL LRG LVL3 (GOWN DISPOSABLE) IMPLANT
GOWN STRL REUS W/ TWL XL LVL3 (GOWN DISPOSABLE) ×1 IMPLANT
GOWN STRL REUS W/TWL 2XL LVL3 (GOWN DISPOSABLE) IMPLANT
GOWN STRL REUS W/TWL LRG LVL3 (GOWN DISPOSABLE)
GOWN STRL REUS W/TWL XL LVL3 (GOWN DISPOSABLE) ×2
HEMOSTAT POWDER KIT SURGIFOAM (HEMOSTASIS) ×2 IMPLANT
KIT BASIN OR (CUSTOM PROCEDURE TRAY) ×2 IMPLANT
KIT EMG SRFC ELECT (KITS) IMPLANT
KIT INFUSE XX SMALL 0.7CC (Orthopedic Implant) ×1 IMPLANT
KIT TURNOVER KIT B (KITS) ×2 IMPLANT
KNIFE ANNULOTOMY RETRAC BLK (BLADE) ×1 IMPLANT
LIF ILLUMINATION SYSTEM STERIL (SYSTAGENIX WOUND MANAGEMENT) ×2
NDL HYPO 25X1 1.5 SAFETY (NEEDLE) ×1 IMPLANT
NEEDLE HYPO 25X1 1.5 SAFETY (NEEDLE) ×2 IMPLANT
NS IRRIG 1000ML POUR BTL (IV SOLUTION) ×2 IMPLANT
PACK LAMINECTOMY NEURO (CUSTOM PROCEDURE TRAY) ×2 IMPLANT
PROBE BALL TIP LLIF SAFEOP (NEUROSURGERY SUPPLIES) ×1 IMPLANT
SCREW ALIF 5.5X45 (Screw) IMPLANT
SCREW LIF AMP 6.5X40 (Screw) ×1 IMPLANT
SCREW LLIF AMP 5.5X40 (Screw) ×1 IMPLANT
SCREW SPINAL NB CTR 15 (Screw) ×1 IMPLANT
SHIM ITRADISCAL BATTALION LLIF (MISCELLANEOUS) IMPLANT
SPACER ANT EXP 21X23X37 0D (Spacer) ×1 IMPLANT
SPACER IDENTITI 6X16X40 10D (Spacer) ×1 IMPLANT
SPONGE LAP 4X18 RFD (DISPOSABLE) IMPLANT
STRIP CLOSURE SKIN 1/2X4 (GAUZE/BANDAGES/DRESSINGS) ×2 IMPLANT
SUT VIC AB 2-0 CT1 18 (SUTURE) ×2 IMPLANT
SUT VICRYL 4-0 PS2 18IN ABS (SUTURE) ×4 IMPLANT
SYSTEM ILLUMINATION LIF STERIL (SYSTAGENIX WOUND MANAGEMENT) IMPLANT
TOWEL GREEN STERILE (TOWEL DISPOSABLE) ×2 IMPLANT
TOWEL GREEN STERILE FF (TOWEL DISPOSABLE) ×2 IMPLANT
TRAY FOLEY MTR SLVR 16FR STAT (SET/KITS/TRAYS/PACK) ×2 IMPLANT
WATER STERILE IRR 1000ML POUR (IV SOLUTION) ×2 IMPLANT

## 2019-08-26 NOTE — Anesthesia Postprocedure Evaluation (Signed)
Anesthesia Post Note  Patient: ALEASA DRAUGHN  Procedure(s) Performed: ANTERIOR LATERAL LUMBAR INTERBODY FUSION, LUMBAR ONE LUMBAR TWO (N/A Spine Lumbar)     Patient location during evaluation: PACU Anesthesia Type: General Level of consciousness: awake and alert Pain management: pain level controlled Vital Signs Assessment: post-procedure vital signs reviewed and stable Respiratory status: spontaneous breathing, nonlabored ventilation, respiratory function stable and patient connected to nasal cannula oxygen Cardiovascular status: blood pressure returned to baseline and stable Postop Assessment: no apparent nausea or vomiting Anesthetic complications: no    Last Vitals:  Vitals:   08/26/19 1500 08/26/19 1515  BP: 136/81 138/89  Pulse: 78 76  Resp: (!) 23 14  Temp:    SpO2: 99% 98%    Last Pain:  Vitals:   08/26/19 1500  TempSrc:   PainSc: 7                  Seabron Iannello S

## 2019-08-26 NOTE — Progress Notes (Signed)
Orthopedic Tech Progress Note Patient Details:  Katie Gaines 01-25-1956 [id number] Called in order to [facility] for an LSO Patient ID: Katie Gaines, female   DOB: Dec 16, 1955, 64 y.o.   MRN: [id number]   Katie Gaines 08/26/2019, 7:19 PM

## 2019-08-26 NOTE — Transfer of Care (Signed)
Immediate Anesthesia Transfer of Care Note  Patient: Katie Gaines  Procedure(s) Performed: ANTERIOR LATERAL LUMBAR INTERBODY FUSION, LUMBAR ONE LUMBAR TWO (N/A Spine Lumbar)  Patient Location: PACU  Anesthesia Type:General  Level of Consciousness: awake, alert , oriented and patient cooperative  Airway & Oxygen Therapy: Patient Spontanous Breathing and Patient connected to nasal cannula oxygen  Post-op Assessment: Report given to RN and Post -op Vital signs reviewed and stable, moving extremities x4  Post vital signs: Reviewed and stable  Last Vitals:  Vitals Value Taken Time  BP 114/91 08/26/19 1429  Temp    Pulse 90 08/26/19 1434  Resp 16 08/26/19 1434  SpO2 94 % 08/26/19 1434  Vitals shown include unvalidated device data.  Last Pain:  Vitals:   08/26/19 0854  TempSrc:   PainSc: 8          Complications: No apparent anesthesia complications

## 2019-08-26 NOTE — Anesthesia Procedure Notes (Signed)
Procedure Name: Intubation Date/Time: 08/26/2019 11:18 AM Performed by: Colin Benton, CRNA Pre-anesthesia Checklist: Patient identified, Emergency Drugs available, Suction available and Patient being monitored Patient Re-evaluated:Patient Re-evaluated prior to induction Oxygen Delivery Method: Circle system utilized Preoxygenation: Pre-oxygenation with 100% oxygen Induction Type: IV induction Ventilation: Mask ventilation without difficulty Laryngoscope Size: Miller and 2 Grade View: Grade I Tube type: Oral Tube size: 7.0 mm Number of attempts: 1 Airway Equipment and Method: Stylet Placement Confirmation: positive ETCO2,  breath sounds checked- equal and bilateral and ETT inserted through vocal cords under direct vision Secured at: 22 cm Tube secured with: Tape Dental Injury: Teeth and Oropharynx as per pre-operative assessment

## 2019-08-26 NOTE — Anesthesia Preprocedure Evaluation (Addendum)
Anesthesia Evaluation  Patient identified by MRN, date of birth, ID band Patient awake    Reviewed: Allergy & Precautions, NPO status , Patient's Chart, lab work & pertinent test results  Airway Mallampati: II  TM Distance: >3 FB Neck ROM: Full    Dental no notable dental hx.    Pulmonary Current Smoker,    Pulmonary exam normal breath sounds clear to auscultation       Cardiovascular hypertension, Pt. on medications Normal cardiovascular exam Rhythm:Regular Rate:Normal     Neuro/Psych Chronic pain, duragesic use negative psych ROS   GI/Hepatic negative GI ROS, Neg liver ROS,   Endo/Other  Hypothyroidism   Renal/GU negative Renal ROS  negative genitourinary   Musculoskeletal negative musculoskeletal ROS (+)   Abdominal   Peds negative pediatric ROS (+)  Hematology negative hematology ROS (+)   Anesthesia Other Findings   Reproductive/Obstetrics negative OB ROS                            Anesthesia Physical Anesthesia Plan  ASA: III  Anesthesia Plan: General   Post-op Pain Management:    Induction: Intravenous  PONV Risk Score and Plan: 3 and Ondansetron, Dexamethasone and Treatment may vary due to age or medical condition  Airway Management Planned: Oral ETT  Additional Equipment: Arterial line  Intra-op Plan:   Post-operative Plan: Extubation in OR  Informed Consent: I have reviewed the patients History and Physical, chart, labs and discussed the procedure including the risks, benefits and alternatives for the proposed anesthesia with the patient or authorized representative who has indicated his/her understanding and acceptance.     Dental advisory given  Plan Discussed with: CRNA and Surgeon  Anesthesia Plan Comments:         Anesthesia Quick Evaluation

## 2019-08-26 NOTE — Op Note (Addendum)
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  Due to patient's progression of clinical syndrome imaging findings failed conservative treatment I recommended anterior lateral interbody fusion with lateral plate at that level.  I extensively went over the risks and benefits of that procedure with her as well as perioperative course expectations of outcome and alternatives of surgery and she understood and agreed to proceed forward.  Operative procedure: Patient brought to the OR was due to general anesthesia positioned left lateral decub left side up and then utilizing fluoroscopy patient was positioned electrodes of and placed prior to positioning and fluoroscopy confirmed entry point in the L1-L2 disc base.  The bed was broken slightly to stretch out the skin and create some space between the 11th and 12th rib.  Then through the 2 incision technique lateral incision was drawn out right over the disc base a finger length posterior incision was also made and through the posterior incision identified the transverse process and psoas and swept up the retroperitoneal and retropleural space off the inside backside of the ribs.  Then cut down the lateral incision right on top of what was believed to be 11th rib but actually was more the 10th rib work between the 10th and 11th rib developing that retropleural pleural space I created entry point axis point rate down the psoas.  Initially it was very  difficult due to extensive mount of adhesions plus also felt like I was possibly running up close to the crura of the diaphragm.  So this is where my assistant Dr. Ronnald Ramp scrubbed in and both of [place] continue to develop the retrocrural retroperitoneal space then passed the initial dilator down and under fluoroscopy conducted in the posterior one third of the L1-2 disc base.  Stimulated in a 3 and 6 degree orientation and placed a Steinmann pin.  Then sequential dilation and the retractor was then positioned then under fluoroscopy we anchored the retractor and placed stimulated in a 3 and 6 degree orientation overlying the disc base after opening up the retractor 2 clicks in each direction.  Then dissected the residual psoas off the disc base stimulation was performed everywhere and nothing registered less than 20.  I then placed the shim opened up the retractor some more and cleared out the disc base.  Disc base was then incised cleaned out with pituitary rongeurs utilizing angle Cobbs I scraped and prepared the endplate as well as a rasp utilizing dilators opened up the disc space and sized up a 6 x 16 mm implant 0 degrees.  Then placed the implant after adequate endplate preparation over slides and then I inserted the plate, anchored it to the interbody implant placed the screws under fluoroscopy I replaced one of the screws as a look like it was bent and could not tell whether had broken or not but and I appear to be intact.  Wounds and copiously irrigated to Kassim states was maintained I pulled the retractor out and had an intact retroperitoneal retroperitoneal space directly visualize the liver, correction the pleura,  and the retroperitoneal space.  Then closed both  incisions with interrupted Vicryls and running 4 subcuticular's.  Dermabond and a sterile dressing was applied patient recovery in stable condition.  At the end the case on needle count sponge counts were correct.

## 2019-08-26 NOTE — Anesthesia Procedure Notes (Signed)
Arterial Line Insertion Start/End1/18/2021 10:45 AM, 08/26/2019 10:55 AM Performed by: Colin Benton, CRNA, CRNA  Patient location: Pre-op. Preanesthetic checklist: patient identified, IV checked, site marked, risks and benefits discussed, surgical consent, monitors and equipment checked, pre-op evaluation, timeout performed and anesthesia consent Lidocaine 1% used for infiltration and patient sedated Left, radial was placed Catheter size: 20 G Hand hygiene performed , maximum sterile barriers used  and Seldinger technique used Allen's test indicative of satisfactory collateral circulation Attempts: 1 Procedure performed without using ultrasound guided technique. Following insertion, dressing applied and Biopatch. Post procedure assessment: normal and unchanged  Patient tolerated the procedure well with no immediate complications.

## 2019-08-26 NOTE — H&P (Signed)
Katie Gaines is an 64 y.o. female.   Chief Complaint: Back and leg pain HPI: 64 year old female with longstanding back bilateral hip and leg pain previously undergone an L3-S1 fusion did fairly well however has had progressive worsening back and pain rating and what seems like an L2 distribution patient did get facet blocks and epidural steroids and did get temporary relief from this.  Work-up has revealed significant degeneration to the L1-2 segment with Modic changes collapse of the disc base and foraminal stenosis to the collapse.  So due to patient progression of clinical syndrome imaging findings and failed conservative treatment I recommended anterior lateral interbody fusion at L1-L2 I extensively gone over the risks and benefits of the procedure with her as well as perioperative course expectations of outcome and alternatives of surgery and she understands and agrees to proceed forward.  However preoperatively she was running a low-grade temperature of 100.6 so we are in the process of repeating her COVID-19 test as well as repeating her white count to determine whether we will be able to be proceed  Past Medical History:  Diagnosis Date  . Anemia   . Anxiety disorder   . Common bile duct stone   . History of kidney stones   . Hypertension   . Hypothyroidism   . Iron deficiency   . Lymphedema of leg    leg    Past Surgical History:  Procedure Laterality Date  . APPENDECTOMY    . BACK SURGERY  1987-2007   X 10 Surgery  . CHOLECYSTECTOMY    . ENTEROSCOPY  11/10/2011   Procedure: ENTEROSCOPY;  Surgeon: Inda Castle, MD;  Location: [facility];  Service: Endoscopy;  Laterality: N/A;  . ERCP  07/19/2011   Procedure: ENDOSCOPIC RETROGRADE CHOLANGIOPANCREATOGRAPHY (ERCP);  Surgeon: Inda Castle, MD;  Location: [facility];  Service: Endoscopy;  Laterality: N/A;  . ESOPHAGOGASTRODUODENOSCOPY  09/2018  . FRACTURE SURGERY Left 2018   left arm fracture  . HOT HEMOSTASIS  11/10/2011    Procedure: HOT HEMOSTASIS (ARGON PLASMA COAGULATION/BICAP);  Surgeon: Inda Castle, MD;  Location: [facility];  Service: Endoscopy;  Laterality: N/A;    Family History  Problem Relation Age of Onset  . Colon polyps Father   . Diabetes Father   . Colon cancer Neg Hx   . Malignant hyperthermia Neg Hx    Social History:  reports that she has been smoking cigarettes. She has smoked for the past 35.00 years. She has never used smokeless tobacco. She reports that she does not drink alcohol or use drugs.  Allergies: No Known Allergies  Medications Prior to Admission  Medication Sig Dispense Refill  . amitriptyline (ELAVIL) 25 MG tablet Take 25 mg by mouth at bedtime.     Marland Kitchen amLODipine (NORVASC) 5 MG tablet Take 5 mg by mouth daily.     . ergocalciferol (VITAMIN D2) 1.25 MG (50000 UT) capsule Take 50,000 Units by mouth once a week.    . fentaNYL (DURAGESIC - DOSED MCG/HR) 50 MCG/HR Place 1 patch onto the skin every 3 (three) days.     Marland Kitchen gabapentin (NEURONTIN) 800 MG tablet Take 800 mg by mouth 4 (four) times daily.     . pantoprazole (PROTONIX) 40 MG tablet Take 40 mg by mouth daily.    Marland Kitchen SYNTHROID 112 MCG tablet Take 112 mcg by mouth daily before breakfast.       No results found for this or any previous visit (from the past 48 hour(s)). No  results found.  Review of Systems  Musculoskeletal: Positive for back pain, gait problem and joint swelling.  Neurological: Positive for numbness.    Blood pressure 138/82, pulse 80, temperature (!) 100.6 F (38.1 C), temperature source Oral, resp. rate 18, height 5' 5.5" (1.664 m), weight 71.8 kg, SpO2 97 %. Physical Exam  Constitutional: She is oriented to person, place, and time. She appears well-developed.  HENT:  Head: Normocephalic.  Eyes: Pupils are equal, round, and reactive to light.  Respiratory: Effort normal.  GI: Soft.  Musculoskeletal:        General: Normal range of motion.     Cervical back: Normal range of motion.   Neurological: She is alert and oriented to person, place, and time. She has normal strength. GCS eye subscore is 4. GCS verbal subscore is 5. GCS motor subscore is 6.  5 out of 5 iliopsoas, quads, hamstrings, gastrocs, into tibialis, and EHL.  Skin: Skin is warm and dry.     Assessment/Plan 64 year old presents for L1-L2 anterior lateral interbody fusion  Katie Presley P, MD 08/26/2019, 9:49 AM

## 2019-08-27 DIAGNOSIS — M5136 Other intervertebral disc degeneration, lumbar region: Secondary | ICD-10-CM | POA: Diagnosis not present

## 2019-08-27 NOTE — Evaluation (Signed)
Occupational Therapy Evaluation Patient Details Name: Katie Gaines MRN: [id number] DOB: 08/27/1955 Today's Date: 08/27/2019    History of Present Illness 64 yo female s/p Anterior lateral interbody fusion with lateral plate L1-L2 PMH anemia, anxiety, hx of kidney stones, HTn, hypothyroidism, iron deficiency, lymphedema back surg x10,    Clinical Impression   Patient evaluated by Occupational Therapy with no further acute OT needs identified. All education has been completed and the patient has no further questions. See below for any follow-up Occupational Therapy or equipment needs. OT to sign off. Thank you for referral.     Follow Up Recommendations  No OT follow up    Equipment Recommendations  None recommended by OT    Recommendations for Other Services       Precautions / Restrictions Precautions Precautions: Back Precaution Comments: educated on back precautions for adls,  unable to recall lifting Required Braces or Orthoses: Spinal Brace Spinal Brace: Lumbar corset Restrictions Weight Bearing Restrictions: No      Mobility Bed Mobility Overal bed mobility: Modified Independent                Transfers Overall transfer level: Modified independent                    Balance                                           ADL either performed or assessed with clinical judgement   ADL Overall ADL's : Needs assistance/impaired Eating/Feeding: Independent   Grooming: Independent   Upper Body Bathing: Independent   Lower Body Bathing: Independent   Upper Body Dressing : Independent   Lower Body Dressing: Minimal assistance;Sit to/from stand Lower Body Dressing Details (indicate cue type and reason): requires A with shoe at this time Toilet Transfer: Modified Independent       Back handout provided and reviewed adls in detail. Pt educated on:  set an alarm at night for medication, avoid sitting for long periods of time,   avoiding lifting more than 5 pounds and never wash directly over incision. All education is complete and patient indicates understanding.               Vision Baseline Vision/History: No visual deficits       Perception     Praxis      Pertinent Vitals/Pain Pain Assessment: No/denies pain     Hand Dominance Right   Extremity/Trunk Assessment Upper Extremity Assessment Upper Extremity Assessment: Overall WFL for tasks assessed       Cervical / Trunk Assessment Cervical / Trunk Assessment: Other exceptions(hx of back surg x10)   Communication Communication Communication: No difficulties   Cognition Arousal/Alertness: Awake/alert Behavior During Therapy: WFL for tasks assessed/performed Overall Cognitive Status: Within Functional Limits for tasks assessed                                     General Comments       Exercises     Shoulder Instructions      Home Living Family/patient expects to be discharged to:: Private residence   Available Help at Discharge: Family Type of Home: House             Bathroom Shower/Tub: Walk-in Psychologist, prison and probation services: Standard  Additional Comments: spouse 24/7      Prior Functioning/Environment Level of Independence: Independent                 OT Problem List:        OT Treatment/Interventions:      OT Goals(Current goals can be found in the care plan section) Acute Rehab OT Goals Patient Stated Goal: to go home today  OT Frequency:     Barriers to D/C:            Co-evaluation              AM-PAC OT "6 Clicks" Daily Activity     Outcome Measure Help from another person eating meals?: None Help from another person taking care of personal grooming?: None Help from another person toileting, which includes using toliet, bedpan, or urinal?: None Help from another person bathing (including washing, rinsing, drying)?: None Help from another person to put on and  taking off regular upper body clothing?: None Help from another person to put on and taking off regular lower body clothing?: None 6 Click Score: 24   End of Session Nurse Communication: Mobility status;Precautions  Activity Tolerance: Patient tolerated treatment well Patient left: in bed;with call bell/phone within reach  OT Visit Diagnosis: Muscle weakness (generalized) (M62.81)                Time: [id number] OT Time Calculation (min): 15 min Charges:  OT General Charges $OT Visit: 1 Visit OT Evaluation $OT Eval Low Complexity: 1 Low   Katie Gaines, Katie Gaines  Acute Rehabilitation Services Pager: [phone] Office: [phone] .   Jeri Modena 08/27/2019, 10:37 AM

## 2019-08-27 NOTE — Plan of Care (Signed)
Pt doing well. Pt given D/C instructions with verbal understanding. Rx's were sent to pharmacy by MD. Pt's incision is clean and dry with no sign of infection. Pt's IV was removed prior to D/C. Pt D/C'd home via wheelchair per MD order. Pt is stable @ D/C and has no other needs at this time. Harold Mattes, RN  

## 2019-08-27 NOTE — Evaluation (Signed)
Physical Therapy Evaluation and Discharge Patient Details Name: Katie Gaines MRN: [id number] DOB: March 05, 1956 Today's Date: 08/27/2019   History of Present Illness  64 yo female s/p Anterior lateral interbody fusion with lateral plate L1-L2 PMH anemia, anxiety, hx of kidney stones, HTn, hypothyroidism, iron deficiency, lymphedema back surg x10,   Clinical Impression  Patient evaluated by Physical Therapy with no further acute PT needs identified. All education has been completed and the patient has no further questions. Pt was able to demonstrate transfers and ambulation with gross modified independence and no AD. Pt was educated on precautions, brace application/wearing schedule, appropriate activity progression, and car transfer. She will have her husband available 24/7 at home for assist as needed. By end of session pt voicing she is ready to leave - husband arrived to pick her up and pt sitting with sunglasses donned awaiting discharge paperwork. RN notified. See below for any follow-up Physical Therapy or equipment needs. PT is signing off. Thank you for this referral.     Follow Up Recommendations No PT follow up;Supervision - Intermittent    Equipment Recommendations  None recommended by PT    Recommendations for Other Services       Precautions / Restrictions Precautions Precautions: Back Precaution Booklet Issued: Yes (comment) Precaution Comments: Reviewed precautions during functional mobility.  Required Braces or Orthoses: Spinal Brace Spinal Brace: Lumbar corset Restrictions Weight Bearing Restrictions: No      Mobility  Bed Mobility Overal bed mobility: Modified Independent                Transfers Overall transfer level: Modified independent Equipment used: None                Ambulation/Gait Ambulation/Gait assistance: Modified independent (Device/Increase time) Gait Distance (Feet): 400 Feet Assistive device: None Gait Pattern/deviations:  Step-through pattern;Trunk flexed Gait velocity: Decreased Gait velocity interpretation: <1.8 ft/sec, indicate of risk for recurrent falls General Gait Details: Pt demonstrating a casual gait pattern with good gait speed. Loose at times with trunk movement but able to maintain precautions with VC's.   Stairs            Wheelchair Mobility    Modified Rankin (Stroke Patients Only)       Balance Overall balance assessment: No apparent balance deficits (not formally assessed)                                           Pertinent Vitals/Pain Pain Assessment: No/denies pain    Home Living Family/patient expects to be discharged to:: Private residence Living Arrangements: Spouse/significant other Available Help at Discharge: Family Type of Home: House Home Access: Level entry(threshold step to enter)     Home Layout: One level Home Equipment: Shower seat - built in Additional Comments: spouse 24/7    Prior Function Level of Independence: Independent               Hand Dominance   Dominant Hand: Right    Extremity/Trunk Assessment   Upper Extremity Assessment Upper Extremity Assessment: Defer to OT evaluation    Lower Extremity Assessment Lower Extremity Assessment: Overall WFL for tasks assessed    Cervical / Trunk Assessment Cervical / Trunk Assessment: Other exceptions Cervical / Trunk Exceptions: hx of back surg x10  Communication   Communication: No difficulties  Cognition Arousal/Alertness: Awake/alert Behavior During Therapy: Restless Overall Cognitive Status: Within Functional Limits  for tasks assessed                                        General Comments      Exercises     Assessment/Plan    PT Assessment Patent does not need any further PT services  PT Problem List         PT Treatment Interventions      PT Goals (Current goals can be found in the Care Plan section)  Acute Rehab PT  Goals Patient Stated Goal: to go home today PT Goal Formulation: All assessment and education complete, DC therapy    Frequency     Barriers to discharge        Co-evaluation               AM-PAC PT "6 Clicks" Mobility  Outcome Measure Help needed turning from your back to your side while in a flat bed without using bedrails?: None Help needed moving from lying on your back to sitting on the side of a flat bed without using bedrails?: None Help needed moving to and from a bed to a chair (including a wheelchair)?: None Help needed standing up from a chair using your arms (e.g., wheelchair or bedside chair)?: None Help needed to walk in hospital room?: None Help needed climbing 3-5 steps with a railing? : None 6 Click Score: 24    End of Session Equipment Utilized During Treatment: Gait belt Activity Tolerance: Patient tolerated treatment well Patient left: with call bell/phone within reach(Sitting EOB awaiting d/c) Nurse Communication: Mobility status PT Visit Diagnosis: Pain;Other symptoms and signs involving the nervous system (R29.898) Pain - part of body: (abdomen)    Time: [id number] PT Time Calculation (min) (ACUTE ONLY): 22 min   Charges:   PT Evaluation $PT Eval Low Complexity: 1 Low          Rolinda Roan, PT, DPT Acute Rehabilitation Services Pager: [phone] Office: [phone]   Thelma Comp 08/27/2019, 11:35 AM

## 2019-08-27 NOTE — Care Management CC44 (Signed)
Condition Code 44 Documentation Completed  Patient Details  Name: Katie Gaines MRN: [id number] Date of Birth: 08-31-1955   Condition Code 44 given:  Yes Patient signature on Condition Code 44 notice:  Yes Documentation of 2 MD's agreement:  Yes Code 44 added to claim:  Yes    Sharin Mons, RN 08/27/2019, 9:15 AM

## 2019-08-27 NOTE — Care Management Obs Status (Signed)
[facility] NOTIFICATION   Patient Details  Name: Katie Gaines MRN: [id number] Date of Birth: 05-Mar-1956   Medicare Observation Status Notification Given:  Yes    Sharin Mons, RN 08/27/2019, 9:15 AM

## 2019-08-27 NOTE — Discharge Summary (Signed)
Physician Discharge Summary  Patient ID: Katie Gaines MRN: [id number] DOB/AGE: March 02, 1956 64 y.o. Estimated body mass index is 25.94 kg/m as calculated from the following:   Height as of this encounter: 5' 5.5" (1.664 m).   Weight as of this encounter: 71.8 kg.   Admit date: 08/26/2019 Discharge date: 08/27/2019  Admission Diagnoses: Anterior lateral interbody fusion with lateral plate L1-L2  Discharge Diagnoses: Same Active Problems:   DDD (degenerative disc disease), lumbar   Discharged Condition: good  Hospital Course: Patient is admitted to the hospital underwent anterior lateral interbody fusion L1-L2 with lateral plate postoperatively patient did very well recovering the floor on the floor was ambulating and voiding spontaneously tolerating regular diet stable for discharge home.  Patient will be discharged scheduled follow-up in 1 to 2 weeks.  Consults: Significant Diagnostic Studies: Treatments: Anterior lateral interbody fusion L1-L2 Discharge Exam: Blood pressure (!) 140/93, pulse 80, temperature 99.1 F (37.3 C), temperature source Oral, resp. rate 16, height 5' 5.5" (1.664 m), weight 71.8 kg, SpO2 96 %. Strength 5 out of 5 wound clean dry and intact  Disposition: Home   Allergies as of 08/27/2019   No Known Allergies     Medication List    TAKE these medications   amitriptyline 25 MG tablet Commonly known as: ELAVIL Take 25 mg by mouth at bedtime.   amLODipine 5 MG tablet Commonly known as: NORVASC Take 5 mg by mouth daily.   ergocalciferol 1.25 MG (50000 UT) capsule Commonly known as: VITAMIN D2 Take 50,000 Units by mouth once a week.   fentaNYL 50 MCG/HR Commonly known as: [place] 1 patch onto the skin every 3 (three) days.   gabapentin 800 MG tablet Commonly known as: NEURONTIN Take 800 mg by mouth 4 (four) times daily.   pantoprazole 40 MG tablet Commonly known as: PROTONIX Take 40 mg by mouth daily.   Synthroid 112 MCG  tablet Generic drug: levothyroxine Take 112 mcg by mouth daily before breakfast.        Signed: Johnathon Olden P 08/27/2019, 7:55 AM

## 2019-08-27 NOTE — Discharge Instructions (Signed)
Wound Care Keep incision covered and dry for one week.  If you shower prior to then, cover incision with plastic wrap.  You may remove outer bandage after one week and shower.  Do not put any creams, lotions, or ointments on incision. Leave steri-strips on neck.  They will fall off by themselves. Activity Walk each and every day, increasing distance each day. No lifting greater than 5 lbs.  Avoid bending, arching, or twisting. No driving for 2 weeks; may ride as a passenger locally. If provided with back brace, wear when out of bed.  It is not necessary to wear in bed. Diet Resume your normal diet.  Return to Work Will be discussed at you follow up appointment. Call Your Doctor If Any of These Occur Redness, drainage, or swelling at the wound.  Temperature greater than 101 degrees. Severe pain not relieved by pain medication. Incision starts to come apart. Follow Up Appt Call today for appointment in 1-2 weeks ([phone]) or for problems.  If you have any hardware placed in your spine, you will need an x-ray before your appointment.   Spinal Fusion, Adult, Care After This sheet gives you information about how to care for yourself after your procedure. Your doctor may also give you more specific instructions. If you have problems or questions, contact your doctor. Follow these instructions at home: Medicines  Take over-the-counter and prescription medicines only as told by your doctor. These include any medicines for pain or blood-thinning medicines (anticoagulants).  If you were prescribed an antibiotic medicine, take it as told by your doctor. Do not stop taking the antibiotic even if you start to feel better.  Do not drive for 24 hours if you were given a medicine to help you relax (sedative) during your procedure.  Do not drive or use heavy machinery while taking prescription pain medicine. If you have a brace:  Wear the brace as told by your doctor. Take it off only as told by  your doctor.  Keep the brace clean. Managing pain, stiffness, and swelling  If directed, put ice on the surgery area: ? If you have a removable brace, take it off as told by your doctor. ? Put ice in a plastic bag. ? Place a towel between your skin and the bag. ? Leave the ice on for 20 minutes, 2-3 times a day. Surgery cut care   Follow instructions from your doctor about how to take care of your cut from surgery (incision). Make sure you: ? Wash your hands with soap and water before you change your bandage (dressing). If you cannot use soap and water, use hand sanitizer. ? Change your bandage as told by your doctor. ? Leave stitches (sutures), skin glue, or skin tape (adhesive) strips in place. They may need to stay in place for 2 weeks or longer. If tape strips get loose and curl up, you may trim the loose edges. Do not remove tape strips completely unless your doctor says it is okay.  Keep your cut from surgery clean and dry. ? Do not take baths, swim, or use a hot tub until your doctor says it is okay. ? Ask your doctor if you can take showers. You may only be allowed to take sponge baths.  Every day, check your cut from surgery and the area around it for: ? More redness, swelling, or pain. ? Fluid or blood. ? Warmth. ? Pus or a bad smell.  If you have a drain tube, follow instructions   from your doctor about caring for it. Do not take out the drain tube or any bandages unless your doctor says it is okay. Physical activity  Rest and protect your back as much as possible.  Follow instructions from your doctor about how to move. Use good posture to help your spine heal.  Do not lift anything that is heavier than 8 lb (3.6 kg), or the limit that you are told, until your doctor says that it is safe.  Do not twist or bend at the waist until your doctor says it is okay.  It is best if you: ? Do not make pushing and pulling motions. ? Do not sit or lie down in the same position  for a long time. ? Do not raise your hands or arms above your head.  Return to your normal activities as told by your doctor. Ask your doctor what activities are safe for you. Rest and protect your back as much as you can.  Do not start to exercise until your doctor says it is okay. Ask your doctor what kinds of exercise you can do to make your back stronger. General instructions  To prevent blood clots and lessen swelling in your legs: ? Wear compression stockings as told. ? Walk one or more times every few hours as told by your doctor.  Do not use any products that contain nicotine or tobacco, such as cigarettes and e-cigarettes. These can delay bone healing. If you need help quitting, ask your doctor.  To prevent or treat constipation while you are taking prescription pain medicine, your doctor may suggest that you: ? Drink enough fluid to keep your pee (urine) pale yellow. ? Take over-the-counter or prescription medicines. ? Eat foods that are high in fiber. These include fresh fruits and vegetables, whole grains, and beans. ? Limit foods that are high in fat and processed sugars, such as fried and sweet foods.  Keep all follow-up visits as told by your doctor. This is important. Contact a doctor if:  Your pain gets worse.  Your medicine does not help your pain.  Your legs or feet get painful or swollen.  Your cut from surgery is more red, swollen, or painful.  Your cut from surgery feels warm to the touch.  You have: ? Fluid or blood coming from your cut from surgery. ? Pus or a bad smell coming from your cut from surgery. ? A fever. ? Weakness or loss of feeling (numbness) in your legs that is new or getting worse. ? Trouble controlling when you pee (urinate) or poop (have a bowel movement).  You feel sick to your stomach (nauseous).  You throw up (vomit). Get help right away if:  Your pain is very bad.  You have chest pain.  You have trouble breathing.  You  start to have a cough. These symptoms may be an emergency. Do not wait to see if the symptoms will go away. Get medical help right away. Call your local emergency services (911 in the [place] Do not drive yourself to the hospital. Summary  After the procedure, it is common to have pain in your back and pain by your surgery cut(s).  Icing and pain medicines may help to control the pain. Follow directions from your doctor.  Rest and protect your back as much as possible. Do not twist or bend at the waist.  Get up and walk one or more times every few hours as told by your doctor. This information   is not intended to replace advice given to you by your health care provider. Make sure you discuss any questions you have with your health care provider. Document Revised: 11/15/2018 Document Reviewed: 11/08/2016 Elsevier Patient Education  2020 Elsevier Inc.  

## 2019-09-02 ENCOUNTER — Encounter: Payer: Self-pay | Admitting: *Deleted

## 2019-10-09 ENCOUNTER — Encounter: Payer: Self-pay | Admitting: *Deleted

## 2019-11-29 DIAGNOSIS — R5383 Other fatigue: Secondary | ICD-10-CM | POA: Insufficient documentation

## 2020-01-28 ENCOUNTER — Other Ambulatory Visit: Payer: Self-pay | Admitting: Neurosurgery

## 2020-01-28 DIAGNOSIS — M5137 Other intervertebral disc degeneration, lumbosacral region: Secondary | ICD-10-CM

## 2020-02-07 ENCOUNTER — Ambulatory Visit
Admission: RE | Admit: 2020-02-07 | Discharge: 2020-02-07 | Disposition: A | Discharge status: Home / Self Care | Payer: Medicare Other | Source: Ambulatory Visit | Attending: Neurosurgery | Admitting: Neurosurgery

## 2020-02-07 DIAGNOSIS — M5137 Other intervertebral disc degeneration, lumbosacral region: Secondary | ICD-10-CM

## 2020-03-16 ENCOUNTER — Ambulatory Visit (INDEPENDENT_AMBULATORY_CARE_PROVIDER_SITE_OTHER): Payer: Medicare Other | Admitting: Neurology

## 2020-03-16 ENCOUNTER — Other Ambulatory Visit: Payer: Self-pay

## 2020-03-16 ENCOUNTER — Encounter: Payer: Self-pay | Admitting: Neurology

## 2020-03-16 VITALS — BP 138/92 | HR 78 | Ht 66.0 in | Wt 163.3 lb

## 2020-03-16 DIAGNOSIS — R208 Other disturbances of skin sensation: Secondary | ICD-10-CM | POA: Diagnosis not present

## 2020-03-16 DIAGNOSIS — Z981 Arthrodesis status: Secondary | ICD-10-CM | POA: Diagnosis not present

## 2020-03-16 DIAGNOSIS — R200 Anesthesia of skin: Secondary | ICD-10-CM | POA: Diagnosis not present

## 2020-03-16 DIAGNOSIS — H539 Unspecified visual disturbance: Secondary | ICD-10-CM | POA: Diagnosis not present

## 2020-03-16 MED ORDER — LAMOTRIGINE 100 MG PO TABS: 100.0000 mg | ORAL_TABLET | Freq: Two times a day (BID) | ORAL | 5 refills | Status: DC

## 2020-03-16 NOTE — Progress Notes (Signed)
[facility]  PATIENT: Katie Gaines DOB: February 14, 1956  REFERRING DOCTOR OR PCP:  Dr. Saintclair Halsted ([facility]); PCP is Anastasia Pall SOURCE: patient, notes from Dr. Saintclair Halsted, Dr. Melford Aase nad Dr Marcelline Deist  _________________________________   HISTORICAL  CHIEF COMPLAINT:  Chief Complaint  Patient presents with  . New Patient (Initial Visit)    RM 13 alone referred by Dr. Saintclair Halsted for paresthesia   . Numbness    Pt had fusion surgery back in Jan- she reports recently numbness/tingling in her left leg along with weakness. She also reports on the left side of her face she has similar sx. She reports interal vibrations constantly through out the day.    HISTORY OF PRESENT ILLNESS:  I had the pleasure of seeing patient, Katie Gaines, at [facility] for neurologic consultation regarding her paresthesias and other symptoms.  She is a 64 year old woman with a history of multiple cervical and lumbar surgical procedures who had increased symptoms late last year and had L1/L2 ALIF 08/26/19 (Dr. Saintclair Halsted).  Initially, she felt she was doing better.  A couple weeks after the surgery, she started to experience dizziness.  Two to three weeks later (mid to late February) she began to notice weakness and numbness in her left side.  This started with numbness in the left leg ut then she also experienced numbness in the left face.    She has a pressure sensation in her left face, in and near the eye.    She has mild numbness off/on in the hand but it is mild.    Over the last couple months, she feels the numbness on the left has worsened,   The internal vibration sensation now seems to involve the whole body.   The facial/eye pressure sensation is the same  She also note vision has done worse.    She feels much more fatigued.  She is not as active as she used to be due to the fatigue and is sleeping more.  She feels mood is doing well.     She sees [place] for pain in  her left leg/foot.  This has been for many years.   She had a spinal cord stimulator for a while.   Besides multiple operations on her lumbar spine, she also had a left peroneal release operation in 2019.   She alsois fused from C3 to C7 (hardware C3C4 and C6C7 on last MRI 2017).   Currently she is on fentanyl, Elavil and gabapentin.  I personally reviewed the following MRI and CT images MRI Brain 02/05/2020: The MRI is normal for age with just a single T2/flair hyperintense focus in the left frontal lobe that is unlikely to be significant.  No evidence of MS.  CT lumbar 02/05/2020: L3-S1 PLIF and L1-L2 left lateral fusion/discectomy.  The fusion appears solid.  There is disc protrusion at L2-L3 and facet hypertrophy combining to cause mild spinal stenosis and mild right foraminal narrowing.  MRI cervical 11/13/2015: Anterior fusion from C3-C7.  Hardware is in place at C3-C4 and C6-C7.  There is solid osseous fusion at C4-C5 and C5-C6 but not noted at C3-C4 and C6-C7 consistent with pseudoarthrosis.  There is disc protrusion and facet hypertrophy at C2-C3 causing mild right foraminal narrowing but no nerve root compression.  REVIEW OF SYSTEMS: Constitutional: No fevers, chills, sweats, or change in appetite Eyes: No visual changes, double vision, eye pain Ear, nose and throat: No hearing loss, ear pain, nasal  congestion, sore throat Cardiovascular: No chest pain, palpitations Respiratory: No shortness of breath at rest or with exertion.   No wheezes GastrointestinaI: No nausea, vomiting, diarrhea, abdominal pain, fecal incontinence Genitourinary: No dysuria, urinary retention or frequency.  No nocturia. Musculoskeletal:As above  integumentary: No rash, pruritus, skin lesions Neurological: as above Psychiatric: No depression at this time.  No anxiety Endocrine: No palpitations, diaphoresis, change in appetite, change in weigh or increased thirst Hematologic/Lymphatic: No anemia, purpura,  petechiae. Allergic/Immunologic: No itchy/runny eyes, nasal congestion, recent allergic reactions, rashes  ALLERGIES: No Known Allergies  HOME MEDICATIONS:  Current Outpatient Medications:  .  amitriptyline (ELAVIL) 25 MG tablet, Take 25 mg by mouth at bedtime. , Disp: , Rfl:  .  amLODipine (NORVASC) 5 MG tablet, Take 5 mg by mouth daily. , Disp: , Rfl:  .  ergocalciferol (VITAMIN D2) 1.25 MG (50000 UT) capsule, Take 50,000 Units by mouth once a week., Disp: , Rfl:  .  fentaNYL (DURAGESIC - DOSED MCG/HR) 50 MCG/HR, Place 1 patch onto the skin every 3 (three) days. , Disp: , Rfl:  .  gabapentin (NEURONTIN) 800 MG tablet, Take 800 mg by mouth 4 (four) times daily. , Disp: , Rfl:  .  pantoprazole (PROTONIX) 40 MG tablet, Take 40 mg by mouth daily., Disp: , Rfl:  .  SYNTHROID 112 MCG tablet, Take 112 mcg by mouth daily before breakfast. , Disp: , Rfl:  .  lamoTRIgine (LAMICTAL) 100 MG tablet, Take 1 tablet (100 mg total) by mouth 2 (two) times daily., Disp: 60 tablet, Rfl: 5  PAST MEDICAL HISTORY: Past Medical History:  Diagnosis Date  . Anemia   . Anxiety disorder   . Common bile duct stone   . Graves disease   . History of kidney stones   . Hypertension   . Hypothyroidism   . Iron deficiency   . Lymphedema of leg    leg    PAST SURGICAL HISTORY: Past Surgical History:  Procedure Laterality Date  . ANTERIOR LAT LUMBAR FUSION N/A 08/26/2019   Procedure: ANTERIOR LATERAL LUMBAR INTERBODY FUSION, LUMBAR ONE LUMBAR TWO;  Surgeon: Kary Kos, MD;  Location: [place];  Service: Neurosurgery;  Laterality: N/A;  anterolateral  . APPENDECTOMY    . BACK SURGERY  1987-2007   X 10 Surgery  . CHOLECYSTECTOMY    . ENTEROSCOPY  11/10/2011   Procedure: ENTEROSCOPY;  Surgeon: Inda Castle, MD;  Location: [facility];  Service: Endoscopy;  Laterality: N/A;  . ERCP  07/19/2011   Procedure: ENDOSCOPIC RETROGRADE CHOLANGIOPANCREATOGRAPHY (ERCP);  Surgeon: Inda Castle, MD;  Location: [facility];  Service: Endoscopy;  Laterality: N/A;  . ESOPHAGOGASTRODUODENOSCOPY  09/2018  . FRACTURE SURGERY Left 2018   left arm fracture  . HOT HEMOSTASIS  11/10/2011   Procedure: HOT HEMOSTASIS (ARGON PLASMA COAGULATION/BICAP);  Surgeon: Inda Castle, MD;  Location: [facility];  Service: Endoscopy;  Laterality: N/A;    FAMILY HISTORY: Family History  Problem Relation Age of Onset  . Colon polyps Father   . Diabetes Father   . Brain cancer Mother   . Colon cancer Neg Hx   . Malignant hyperthermia Neg Hx     SOCIAL HISTORY:  Social History   Socioeconomic History  . Marital status: Married    Spouse name: Not on file  . Number of children: Not on file  . Years of education: Not on file  . Highest education level: Not on file  Occupational History  . Not on file  Tobacco Use  . Smoking status: Current Some Day Smoker    Years: 35.00    Types: Cigarettes  . Smokeless tobacco: Never Used  Vaping Use  . Vaping Use: Never used  Substance and Sexual Activity  . Alcohol use: Yes    Alcohol/week: 0.0 standard drinks    Comment: 1 drink per month   . Drug use: No  . Sexual activity: Not on file  Other Topics Concern  . Not on file  Social History Narrative   Caffeine: 1 soda daily   Social Determinants of Health   Financial Resource Strain:   . Difficulty of Paying Living Expenses:   Food Insecurity:   . Worried About Charity fundraiser in the Last Year:   . Arboriculturist in the Last Year:   Transportation Needs:   . Film/video editor (Medical):   Marland Kitchen Lack of Transportation (Non-Medical):   Physical Activity:   . Days of Exercise per Week:   . Minutes of Exercise per Session:   Stress:   . Feeling of Stress :   Social Connections:   . Frequency of Communication with Friends and Family:   . Frequency of Social Gatherings with Friends and Family:   . Attends Religious Services:   . Active Member of Clubs or Organizations:   . Attends Theatre manager Meetings:   Marland Kitchen Marital Status:   Intimate Partner Violence:   . Fear of Current or Ex-Partner:   . Emotionally Abused:   Marland Kitchen Physically Abused:   . Sexually Abused:      PHYSICAL EXAM  Vitals:   03/16/20 1018  BP: (!) 138/92  Pulse: 78  Weight: 163 lb 5 oz (74.1 kg)  Height: 5\' 6"  (1.676 m)    Body mass index is 26.36 kg/m.   General: The patient is well-developed and well-nourished and in no acute distress  HEENT:  Head is [place]/AT.  Sclera are anicteric.  Funduscopic exam shows normal optic discs and retinal vessels.  Neck: Anterior scars noted.  No bruit.  Range of motion is reduced.  Nontender.  Cardiovascular: The heart has a regular rate and rhythm with a normal S1 and S2. There were no murmurs, gallops or rubs.    Skin: Extremities are without rash or  edema.  Musculoskeletal: Surgical scars noted in back back is nontender.  Mildly reduced range of motion.  Neurologic Exam  Mental status: The patient is alert and oriented x 3 at the time of the examination. The patient has apparent normal recent and remote memory, with an apparently normal attention span and concentration ability.   Speech is normal.  Cranial nerves: Extraocular movements are full. Pupils are equal, round, and reactive to light and accomodation.  Visual fields are full.  Facial symmetry is present. There is good facial sensation to soft touch bilaterally.Facial strength is normal.  Trapezius and sternocleidomastoid strength is normal. No dysarthria is noted.  The tongue is midline, and the patient has symmetric elevation of the soft palate. No obvious hearing deficits are noted.  Motor:  Muscle bulk is normal.   Tone is normal. Strength is  5 / 5 in all 4 extremities.   Sensory: She reported reduced sensation in the left arm relative to the right more noticeable with temperature than touch.  Vibration was more symmetric.  In the legs, she reported reduced sensation in the entire left leg  relative to the right with more pronounced numbness in the L5 distribution.  Coordination: Cerebellar testing reveals good finger-nose-finger and heel-to-shin bilaterally.  Gait and station: Station is normal.   Gait is normal. Tandem gait is normal. Romberg is negative.   Reflexes: Deep tendon reflexes are symmetric and normal in arms and 1+ at knees and ankles.   Plantar responses are flexor.     DIAGNOSTIC DATA (LABS, IMAGING, TESTING) - I reviewed patient records, labs, notes, testing and imaging myself where available.  Lab Results  Component Value Date   WBC 5.6 08/26/2019   HGB 14.9 08/26/2019   HCT 44.9 08/26/2019   MCV 93.0 08/26/2019   PLT 198 08/26/2019      Component Value Date/Time   NA 144 08/20/2019 1415   K 3.8 08/20/2019 1415   CL 106 08/20/2019 1415   CO2 29 08/20/2019 1415   GLUCOSE 93 08/20/2019 1415   BUN 13 08/20/2019 1415   CREATININE 0.72 08/20/2019 1415   CALCIUM 9.3 08/20/2019 1415   PROT 7.6 04/18/2012 1749   ALBUMIN 4.2 04/18/2012 1749   AST 20 04/18/2012 1749   ALT 13 04/18/2012 1749   ALKPHOS 130 (H) 04/18/2012 1749   BILITOT 0.4 04/18/2012 1749   GFRNONAA >60 08/20/2019 1415   GFRAA >60 08/20/2019 1415   Lab Results  Component Value Date   CHOL  03/30/2009    159        ATP III CLASSIFICATION:  <200     mg/dL   Desirable  200-239  mg/dL   Borderline High  >=240    mg/dL   High          HDL 64 03/30/2009   LDLCALC  03/30/2009    78        Total Cholesterol/HDL:CHD Risk Coronary Heart Disease Risk Table                     Men   Women  1/2 Average Risk   3.4   3.3  Average Risk       5.0   4.4  2 X Average Risk   9.6   7.1  3 X Average Risk  23.4   11.0        Use the calculated Patient Ratio above and the CHD Risk Table to determine the patient's CHD Risk.        ATP III CLASSIFICATION (LDL):  <100     mg/dL   Optimal  100-129  mg/dL   Near or Above                    Optimal  130-159  mg/dL   Borderline  160-189   mg/dL   High  >190     mg/dL   Very High   TRIG 85 03/30/2009   CHOLHDL 2.5 03/30/2009   No results found for: HGBA1C No results found for: VITAMINB12 Lab Results  Component Value Date   TSH 1.65 12/12/2011       ASSESSMENT AND PLAN  Dysesthesia  Numbness  Visual changes  History of fusion of cervical spine  History of fusion of lumbar spine  In summary, Katie Gaines is a 64 year old woman with a history of multiple spinal operations who has reported dysesthesias, myalgias and other symptoms over the past few months.  I discussed with her that the MRI of the brain looks completely normal for her age.  Therefore, MS, stroke and brain tumor are ruled out.  I do not have a great explanation for her symptoms, especially  the facial numbness on the left.  Certainly the dysesthesias in the leg could be related to her multiple spinal issues that would be expected to fluctuate some over time.  Because she continues to have dysesthetic pain, I will start lamotrigine and titrate up to 100 mg p.o. twice daily.  If she gets a partial response we can titrate this up further.  If there is no benefit she will stop after 6 weeks.  Additionally, there is no benefit we will check an MRI of the cervical spine to determine if there is an alternative diagnosis such as a myelopathy that could explain some of her symptoms.  She will return to see [place] in about 3 months but call sooner if there are new or worsening neurologic symptoms.  Thank you for asking me to see Katie Gaines.  Please let me know if I can be of further assistance with her or other patients in the future.  Tarius Stangelo A. Felecia Shelling, MD, [facility] 09/10/3005, 6:22 PM Certified in Neurology, Clinical Neurophysiology, Sleep Medicine and Neuroimaging  [facility] [address] [place], [place] [address] [phone]

## 2020-03-16 NOTE — Patient Instructions (Signed)
The pharmacy has the prescription for lamotrigine 100 mg tablets. For 5 days, just take one half pill a day. For the next 5 days, take one half pill twice a day. For the next 5 days, take one half pill 3 times a day Then start taking one pill twice a day from this point on.    In the future, we may increase the dose further.  If you get a rash, need to stop the medication and not take it again. 

## 2020-03-29 ENCOUNTER — Encounter (HOSPITAL_COMMUNITY): Payer: Self-pay | Admitting: Emergency Medicine

## 2020-03-29 ENCOUNTER — Emergency Department (HOSPITAL_COMMUNITY)
Admission: EM | Admit: 2020-03-29 | Discharge: 2020-03-29 | Disposition: A | Discharge status: Home / Self Care | Payer: Medicare Other | Attending: Emergency Medicine | Admitting: Emergency Medicine

## 2020-03-29 ENCOUNTER — Other Ambulatory Visit: Payer: Self-pay

## 2020-03-29 DIAGNOSIS — R21 Rash and other nonspecific skin eruption: Secondary | ICD-10-CM

## 2020-03-29 DIAGNOSIS — E039 Hypothyroidism, unspecified: Secondary | ICD-10-CM | POA: Diagnosis not present

## 2020-03-29 DIAGNOSIS — F17210 Nicotine dependence, cigarettes, uncomplicated: Secondary | ICD-10-CM | POA: Diagnosis not present

## 2020-03-29 DIAGNOSIS — Z79899 Other long term (current) drug therapy: Secondary | ICD-10-CM | POA: Insufficient documentation

## 2020-03-29 DIAGNOSIS — I10 Essential (primary) hypertension: Secondary | ICD-10-CM | POA: Insufficient documentation

## 2020-03-29 DIAGNOSIS — A681 Tick-borne relapsing fever: Secondary | ICD-10-CM | POA: Insufficient documentation

## 2020-03-29 DIAGNOSIS — Z9189 Other specified personal risk factors, not elsewhere classified: Secondary | ICD-10-CM

## 2020-03-29 LAB — CBC WITH DIFFERENTIAL/PLATELET
Abs Immature Granulocytes: 0.02 10*3/uL (ref 0.00–0.07)
Basophils Absolute: 0 10*3/uL (ref 0.0–0.1)
Basophils Relative: 0 %
Eosinophils Absolute: 0.4 10*3/uL (ref 0.0–0.5)
Eosinophils Relative: 8 %
HCT: 45.7 % (ref 36.0–46.0)
Hemoglobin: 14.9 g/dL (ref 12.0–15.0)
Immature Granulocytes: 0 %
Lymphocytes Relative: 16 %
Lymphs Abs: 0.8 10*3/uL (ref 0.7–4.0)
MCH: 29.9 pg (ref 26.0–34.0)
MCHC: 32.6 g/dL (ref 30.0–36.0)
MCV: 91.8 fL (ref 80.0–100.0)
Monocytes Absolute: 0.4 10*3/uL (ref 0.1–1.0)
Monocytes Relative: 7 %
Neutro Abs: 3.3 10*3/uL (ref 1.7–7.7)
Neutrophils Relative %: 69 %
Platelets: 120 10*3/uL — ABNORMAL LOW (ref 150–400)
RBC: 4.98 MIL/uL (ref 3.87–5.11)
RDW: 13 % (ref 11.5–15.5)
WBC: 4.8 10*3/uL (ref 4.0–10.5)
nRBC: 0 % (ref 0.0–0.2)

## 2020-03-29 LAB — COMPREHENSIVE METABOLIC PANEL
ALT: 22 U/L (ref 0–44)
AST: 23 U/L (ref 15–41)
Albumin: 3.6 g/dL (ref 3.5–5.0)
Alkaline Phosphatase: 133 U/L — ABNORMAL HIGH (ref 38–126)
Anion gap: 12 (ref 5–15)
BUN: 16 mg/dL (ref 8–23)
CO2: 23 mmol/L (ref 22–32)
Calcium: 9 mg/dL (ref 8.9–10.3)
Chloride: 107 mmol/L (ref 98–111)
Creatinine, Ser: 0.79 mg/dL (ref 0.44–1.00)
GFR calc Af Amer: >60 mL/min (ref >60)
GFR calc non Af Amer: >60 mL/min (ref >60)
Glucose, Bld: 109 mg/dL — ABNORMAL HIGH (ref 70–99)
Potassium: 3.8 mmol/L (ref 3.5–5.1)
Sodium: 142 mmol/L (ref 135–145)
Total Bilirubin: 0.5 mg/dL (ref 0.3–1.2)
Total Protein: 6.7 g/dL (ref 6.5–8.1)

## 2020-03-29 LAB — PROTIME-INR
INR: 1 (ref 0.8–1.2)
Prothrombin Time: 12.4 seconds (ref 11.4–15.2)

## 2020-03-29 MED ORDER — DOXYCYCLINE HYCLATE 100 MG PO CAPS: 100.0000 mg | ORAL_CAPSULE | Freq: Two times a day (BID) | ORAL | 0 refills | Status: DC

## 2020-03-29 MED ORDER — DOXYCYCLINE HYCLATE 100 MG PO TABS: 100.0000 mg | ORAL_TABLET | Freq: Once | ORAL | Status: DC

## 2020-03-29 NOTE — ED Provider Notes (Signed)
[facility] EMERGENCY DEPARTMENT Provider Note   CSN: [id number] Arrival date & time: 03/29/20  1153     History Chief Complaint  Patient presents with  . tick bite    Katie Gaines is a 64 y.o. female.  HPI     This a 64 year old female with a history of hypertension, Graves' disease, degenerative disc disease who presents with rash.  Patient reports that she removed 2 small ticks off of her left leg on 8/16.  She had been in the yard several days prior to that.  Today she woke up with a rash to the bilateral lower extremities.  She is not noted any systemic symptoms including fevers, cough, shortness of breath, abdominal pain, nausea, vomiting.  She states that the tics were fairly small.  She believes that the rash is limited to her legs.  She has not noted anything on the palms or the soles.  She is not on any blood thinners or aspirin.  She endorses some "achiness" in the legs.  Past Medical History:  Diagnosis Date  . Anemia   . Anxiety disorder   . Common bile duct stone   . Graves disease   . History of kidney stones   . Hypertension   . Hypothyroidism   . Iron deficiency   . Lymphedema of leg    leg    Patient Active Problem List   Diagnosis Date Noted  . Dysesthesia 03/16/2020  . Numbness 03/16/2020  . Visual changes 03/16/2020  . History of fusion of cervical spine 03/16/2020  . History of fusion of lumbar spine 03/16/2020  . DDD (degenerative disc disease), lumbar 08/26/2019  . Obstruction of bile duct 07/19/2011  . Abnormal radiographic examination 07/19/2011  . IRON DEFICIENCY 09/10/2009    Past Surgical History:  Procedure Laterality Date  . ANTERIOR LAT LUMBAR FUSION N/A 08/26/2019   Procedure: ANTERIOR LATERAL LUMBAR INTERBODY FUSION, LUMBAR ONE LUMBAR TWO;  Surgeon: Kary Kos, MD;  Location: [facility];  Service: Neurosurgery;  Laterality: N/A;  anterolateral  . APPENDECTOMY    . BACK SURGERY  1987-2007   X 10 Surgery  .  CHOLECYSTECTOMY    . ENTEROSCOPY  11/10/2011   Procedure: ENTEROSCOPY;  Surgeon: Inda Castle, MD;  Location: [facility];  Service: Endoscopy;  Laterality: N/A;  . ERCP  07/19/2011   Procedure: ENDOSCOPIC RETROGRADE CHOLANGIOPANCREATOGRAPHY (ERCP);  Surgeon: Inda Castle, MD;  Location: [facility];  Service: Endoscopy;  Laterality: N/A;  . ESOPHAGOGASTRODUODENOSCOPY  09/2018  . FRACTURE SURGERY Left 2018   left arm fracture  . HOT HEMOSTASIS  11/10/2011   Procedure: HOT HEMOSTASIS (ARGON PLASMA COAGULATION/BICAP);  Surgeon: Inda Castle, MD;  Location: [facility];  Service: Endoscopy;  Laterality: N/A;     OB History   No obstetric history on file.     Family History  Problem Relation Age of Onset  . Colon polyps Father   . Diabetes Father   . Brain cancer Mother   . Colon cancer Neg Hx   . Malignant hyperthermia Neg Hx     Social History   Tobacco Use  . Smoking status: Current Some Day Smoker    Years: 35.00    Types: Cigarettes  . Smokeless tobacco: Never Used  Vaping Use  . Vaping Use: Never used  Substance Use Topics  . Alcohol use: Yes    Alcohol/week: 0.0 standard drinks    Comment: 1 drink per month   . Drug use: No  Home Medications Prior to Admission medications   Medication Sig Start Date End Date Taking? Authorizing Provider  amitriptyline (ELAVIL) 25 MG tablet Take 25 mg by mouth at bedtime.  11/06/17   [provider]  amLODipine (NORVASC) 5 MG tablet Take 5 mg by mouth daily.  01/02/18   [provider]  doxycycline (VIBRAMYCIN) 100 MG capsule Take 1 capsule (100 mg total) by mouth 2 (two) times daily. 03/29/20   Hoover Grewe, Barbette Hair, MD  ergocalciferol (VITAMIN D2) 1.25 MG (50000 UT) capsule Take 50,000 Units by mouth once a week.    [provider]  fentaNYL (DURAGESIC - DOSED MCG/HR) 50 MCG/HR Place 1 patch onto the skin every 3 (three) days.  02/06/18   [provider]  gabapentin (NEURONTIN) 800 MG tablet  Take 800 mg by mouth 4 (four) times daily.     [provider]  lamoTRIgine (LAMICTAL) 100 MG tablet Take 1 tablet (100 mg total) by mouth 2 (two) times daily. 03/16/20   Sater, Nanine Means, MD  pantoprazole (PROTONIX) 40 MG tablet Take 40 mg by mouth daily.    [provider]  SYNTHROID 112 MCG tablet Take 112 mcg by mouth daily before breakfast.  01/27/18   [provider]    Allergies    Patient has no known allergies.  Review of Systems   Review of Systems  Constitutional: Negative for fever.  Respiratory: Negative for shortness of breath.   Cardiovascular: Negative for chest pain and leg swelling.  Gastrointestinal: Negative for abdominal pain, nausea and vomiting.  Skin: Positive for rash.  All other systems reviewed and are negative.   Physical Exam Updated Vital Signs BP (!) 148/86 (BP Location: Right Arm)   Pulse 96   Temp 98.6 F (37 C) (Oral)   Resp 16   Wt 72.6 kg   SpO2 99%   BMI 25.82 kg/m   Physical Exam Vitals and nursing note reviewed.  Constitutional:      Appearance: She is well-developed. She is not ill-appearing.  HENT:     Head: Normocephalic and atraumatic.     Nose: Nose normal.     Mouth/Throat:     Mouth: Mucous membranes are moist.  Eyes:     Pupils: Pupils are equal, round, and reactive to light.  Cardiovascular:     Rate and Rhythm: Normal rate and regular rhythm.     Heart sounds: Normal heart sounds.  Pulmonary:     Effort: Pulmonary effort is normal. No respiratory distress.     Breath sounds: No wheezing.  Musculoskeletal:     Cervical back: Neck supple.     Right lower leg: No edema.     Left lower leg: No edema.  Skin:    General: Skin is warm and dry.     Comments: Fine petechial rash bilateral lower extremities, does not involve the palms and soles, no targetoid lesions noted  Neurological:     Mental Status: She is alert and oriented to person, place, and time.  Psychiatric:        Mood and Affect:  Mood normal.     ED Results / Procedures / Treatments   Labs (all labs ordered are listed, but only abnormal results are displayed) Labs Reviewed  CBC WITH DIFFERENTIAL/PLATELET - Abnormal; Notable for the following components:      Result Value   Platelets 120 (*)    All other components within normal limits  COMPREHENSIVE METABOLIC PANEL - Abnormal; Notable for the  following components:   Glucose, Bld 109 (*)    Alkaline Phosphatase 133 (*)    All other components within normal limits  PROTIME-INR  ROCKY MTN SPOTTED FVR ABS PNL(IGG+IGM)    EKG None  Radiology No results found.  Procedures Procedures (including critical care time)  Medications Ordered in ED Medications  doxycycline (VIBRA-TABS) tablet 100 mg (has no administration in time range)    ED Course  I have reviewed the triage vital signs and the nursing notes.  Pertinent labs & imaging results that were available during my care of the patient were reviewed by me and considered in my medical decision making (see chart for details).    MDM Rules/Calculators/A&P                           Patient presents with rash.  Overall nontoxic and vital signs are reassuring.  She is afebrile.  Recent tick exposure.  Rash is petechial in nature.  It does spare the palms and soles; however, tickborne illness including RMSF is a consideration.  Vasculitis is also consideration.  Will obtain lab work and patient was given a dose of doxycycline.  Platelets are 120 which are slightly low from her baseline but otherwise LFTs are reassuring.  PT/INR is normal.  Will discharge with doxycycline empirically for presumed tickborne illness.  Patient was given strict return precautions.  After history, exam, and medical workup I feel the patient has been appropriately medically screened and is safe for discharge home. Pertinent diagnoses were discussed with the patient. Patient was given return precautions.   Final Clinical  Impression(s) / ED Diagnoses Final diagnoses:  Rash  At high risk for tick borne illness    Rx / DC Orders ED Discharge Orders         Ordered    doxycycline (VIBRAMYCIN) 100 MG capsule  2 times daily        03/29/20 1440           Imanol Bihl, Barbette Hair, MD 03/29/20 1442

## 2020-03-29 NOTE — ED Triage Notes (Signed)
Pt reports 2 ticks removed on 8/16.  1 from L lateral thigh and 1 from L medial thigh.  Reports rash to bilateral legs since this morning with pain "inside legs".  Denies fever and chills.  Denies nausea and vomiting.

## 2020-03-29 NOTE — Discharge Instructions (Addendum)
You were seen today with a rash.  Given your recent tick exposure, you will be treated with doxycycline.  Your lab work right now is reassuring.  If you develop worsening symptoms, fevers, worsening rash you should be reevaluated.

## 2020-04-05 LAB — ROCKY MTN SPOTTED FVR ABS PNL(IGG+IGM)
RMSF IgG: NEGATIVE
RMSF IgM: 0.28 index (ref 0.00–0.89)

## 2020-04-30 ENCOUNTER — Other Ambulatory Visit: Payer: Self-pay

## 2020-04-30 ENCOUNTER — Ambulatory Visit
Admission: RE | Admit: 2020-04-30 | Discharge: 2020-04-30 | Disposition: A | Discharge status: Home / Self Care | Payer: Medicare Other | Source: Ambulatory Visit | Attending: Acute Care | Admitting: Acute Care

## 2020-04-30 DIAGNOSIS — Z87891 Personal history of nicotine dependence: Secondary | ICD-10-CM

## 2020-04-30 DIAGNOSIS — F17210 Nicotine dependence, cigarettes, uncomplicated: Secondary | ICD-10-CM

## 2020-04-30 DIAGNOSIS — Z122 Encounter for screening for malignant neoplasm of respiratory organs: Secondary | ICD-10-CM

## 2020-05-01 ENCOUNTER — Other Ambulatory Visit: Payer: Self-pay | Admitting: *Deleted

## 2020-05-01 DIAGNOSIS — Z87891 Personal history of nicotine dependence: Secondary | ICD-10-CM

## 2020-05-01 DIAGNOSIS — F17210 Nicotine dependence, cigarettes, uncomplicated: Secondary | ICD-10-CM

## 2020-05-01 NOTE — Progress Notes (Signed)
Please call patient and let them  know their  low dose Ct was read as a Lung RADS 2: nodules that are benign in appearance and behavior with a very low likelihood of becoming a clinically active cancer due to size or lack of growth. Recommendation per radiology is for a repeat LDCT in 12 months. .Please let them  know we will order and schedule their  annual screening scan for 04/2021. Please let them  know there was notation of CAD on their  scan.  Please remind the patient  that this is a non-gated exam therefore degree or severity of disease  cannot be determined. Please have them  follow up with their PCP regarding potential risk factor modification, dietary therapy or pharmacologic therapy if clinically indicated. °Pt.  is  not currently on statin therapy. Please place order for annual  screening scan for  04/2021 and fax results to PCP. Thanks so much. °

## 2020-05-18 ENCOUNTER — Ambulatory Visit: Payer: Medicare Other | Admitting: Neurology

## 2020-06-01 ENCOUNTER — Other Ambulatory Visit: Payer: Self-pay

## 2020-06-01 DIAGNOSIS — R200 Anesthesia of skin: Secondary | ICD-10-CM

## 2020-06-02 ENCOUNTER — Other Ambulatory Visit: Payer: Self-pay

## 2020-06-02 ENCOUNTER — Encounter (INDEPENDENT_AMBULATORY_CARE_PROVIDER_SITE_OTHER): Payer: Medicare Other | Admitting: Neurology

## 2020-06-02 ENCOUNTER — Ambulatory Visit (INDEPENDENT_AMBULATORY_CARE_PROVIDER_SITE_OTHER): Payer: Medicare Other | Admitting: Neurology

## 2020-06-02 DIAGNOSIS — R200 Anesthesia of skin: Secondary | ICD-10-CM

## 2020-06-02 DIAGNOSIS — Z981 Arthrodesis status: Secondary | ICD-10-CM | POA: Diagnosis not present

## 2020-06-02 DIAGNOSIS — R208 Other disturbances of skin sensation: Secondary | ICD-10-CM | POA: Diagnosis not present

## 2020-06-02 DIAGNOSIS — Z0289 Encounter for other administrative examinations: Secondary | ICD-10-CM

## 2020-06-02 DIAGNOSIS — G5702 Lesion of sciatic nerve, left lower limb: Secondary | ICD-10-CM

## 2020-06-02 MED ORDER — LAMOTRIGINE 150 MG PO TABS
150.0000 mg | ORAL_TABLET | Freq: Two times a day (BID) | ORAL | 11 refills | Status: DC
Start: 2020-06-02 — End: 2021-05-03

## 2020-06-02 NOTE — Progress Notes (Signed)
Full Name: Katie Gaines Gender: Female MRN #: [id number] Date of Birth: 14-Mar-1956    Visit Date: 06/02/2020 07:55 Age: 64 Years Examining Physician: Arlice Colt, MD  Referring Physician: Kary Kos, MD    History: Katie Gaines is a 64 year old woman who has had multiple lumbar operations and is currently fused at L1-L2 and from L3-S1.  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  She has different types of sensory symptoms.   Additionally, she has more severe pain in the right heel.  The heel pain is more recent.  On exam, there is numbness in both feet, dorsum more than plantar surfaces.  Sshe has further numbness of the right leg in the peroneal distribution.  She has 4+5 strength in the left toe and ankle extensors and normal strength elsewhere.  Nerve conduction studies:  The bilateral peroneal and tibial motor responses had normal distal latencies, normal amplitudes and normal conduction velocities.  The F-wave latencies were normal.  Bilateral sural sensory responses had normal peak latencies and amplitudes.  Bilateral superficial peroneal sensory responses had normal peak latencies with mildly reduced amplitude.  The medial and lateral plantar sensory responses on the right had normal peak latency and amplitude.  Electromyography: Needle EMG of selected muscles of both legs was performed.  On the left, there was moderate chronic denervation in the peroneal innervated tibialis anterior and peroneus longus muscles.  Both the other muscles tested showed mild chronic denervation.  There was no abnormal spontaneous activity.  On the right, there was mild chronic denervation in most of the muscles tested with relative sparing of the S1 innervated peroneus longus and gastrocnemius muscles.  There is no abnormal spontaneous activity in any of the muscles tested.  Impression: This NCV/EMG study shows the following: 1.    Moderate chronic denervation in peroneal innervated muscles on the left consistent with her history of peroneal neuropathy.  There does not appear to be any ongoing process involving that nerve as the nerve conduction studywas normal and there was no abnormal spontaneous activity 2.   No evidence of other mononeuropathy in the legs. 3.   Multilevel chronic radiculopathies consistent with her history.  This involves at least the L3, L4, L5 and S1 nerve roots on the left and the L3, L4 and L5 nerve roots on the right  Jerrell Hart A. Felecia Shelling, MD, PhD, FAAN Certified in Neurology, Clinical Neurophysiology, Sleep Medicine, Pain Medicine and Neuroimaging Director, [place] [facility] [address], [place], [place] [address] [phone]     Verbal informed consent was obtained from the patient, patient was informed of potential risk of procedure, including bruising, bleeding, hematoma formation, infection, muscle weakness, muscle pain, numbness, among others.        [place]    Nerve / Sites Muscle Latency Ref. Amplitude Ref. Rel Amp Segments Distance Velocity Ref. Area    ms ms mV mV %  cm m/s m/s mVms  L Peroneal - EDB     Ankle EDB 4.0 ?6.5 4.0 ?2.0 100 Ankle - EDB 9   16.5     Fib head EDB 10.0  3.4  83.4 Fib head - Ankle 26 44 ?44 17.3     Pop fossa EDB 12.3  3.8  112 Pop fossa - Fib head 10 44 ?44 15.5         Pop fossa -  Ankle      R Peroneal - EDB     Ankle EDB 4.1 ?6.5 3.9 ?2.0 100 Ankle - EDB 9   13.7     Fib head EDB 10.5  3.1  79.9 Fib head - Ankle 28 44 ?44 12.6     Pop fossa EDB 12.8  3.0  96.2 Pop fossa - Fib head 10 44 ?44 13.0         Pop fossa - Ankle      L Tibial - AH     Ankle AH 5.6 ?5.8 8.6 ?4.0 100 Ankle - AH 9   22.6     Pop fossa AH 13.2  6.8  79.7 Pop fossa - Ankle 35 46 ?41 21.2  R Tibial - AH     Ankle AH 4.2 ?5.8 10.9 ?4.0 100 Ankle - AH 9   25.0     Pop fossa AH 13.3  6.8  61.8 Pop  fossa - Ankle 37 41 ?41 20.6             SNC    Nerve / Sites Rec. Site Peak Lat Ref.  Amp Ref. Segments Distance Peak Diff Ref.    ms ms V V  cm ms ms  L Sural - Ankle (Calf)     Calf Ankle 4.4 ?4.4 7 ?6 Calf - Ankle 14    R Sural - Ankle (Calf)     Calf Ankle 3.7 ?4.4 8 ?6 Calf - Ankle 14    L Superficial peroneal - Ankle     Lat leg Ankle 3.4 ?4.4 4 ?6 Lat leg - Ankle 14    R Superficial peroneal - Ankle     Lat leg Ankle 4.3 ?4.4 3 ?6 Lat leg - Ankle 14    R Medial plantar, Lateral plantar - Ankle (Medial, lateral sole)     Medial plantar Sole Ankle 3.6 ?3.7 4 ?3 Medial plantar Sole - Ankle 14       Lateral plantar Sole Ankle 3.5 ?3.7 11 ?3 Lateral plantar Sole - Ankle 14          Medial plantar Sole - Lateral plantar Sole  0.0 ?0.0               F  Wave    Nerve F Lat Ref.   ms ms  L Tibial - AH 54.6 ?56.0  R Tibial - AH 53.2 ?56.0         EMG Summary Table    Spontaneous MUAP Recruitment  Muscle IA Fib PSW Fasc Other Amp Dur. Poly Pattern  L. Vastus medialis Normal None None None _______ Normal Increased 1+ Reduced  L. Tibialis anterior Normal None None None _______ Increased Increased 3+ Discrete  L. Peroneus longus Normal None None None _______ Increased Increased 2+ Reduced  L. Gastrocnemius (Medial head) Normal None None None _______ Normal Normal 1+ Normal  L. Abductor hallucis Normal None None None _______ Normal Increased 1+ Reduced  L. Gluteus medius Normal None None None _______ Normal Normal 1+ Reduced  R. Vastus medialis Normal None None None _______ Normal Normal 1+ Reduced  R. Tibialis anterior Normal None None None _______ Increased Normal 1+ Reduced  R. Peroneus longus Normal None None None _______ Normal Normal 1+ Normal  R. Gastrocnemius (Medial head) Normal None None None _______ Normal Increased 1+ Reduced  R. Abductor hallucis Normal None None None _______ Normal Increased 1+ Reduced  L. Iliopsoas Normal None None None _______ Normal Normal  1+  Reduced  L. Abductor digiti minimi (pedis) Normal None None None _______ Normal Increased 2+ Reduced         [facility]  PATIENT: Katie Gaines DOB: 04/07/1956  REFERRING DOCTOR OR PCP:  Dr. Saintclair Halsted ([facility]); PCP is Anastasia Pall SOURCE: patient, notes from Dr. Saintclair Halsted, Dr. Melford Aase nad Dr Marcelline Deist  _________________________________   HISTORICAL  CHIEF COMPLAINT:  Chief Complaint  Patient presents with  . Pain    HISTORY OF PRESENT ILLNESS:  I had the pleasure of seeing patient, Katie Gaines, at [facility] for neurologic consultation regarding her paresthesias and other symptoms.  Update 06/02/2020: Since the last visit, she started lamotrigine.    Lamotrigine has helped the internal shaking sensation but has not helped the dysesthesia much.   She has different types of sensory symptoms.  There is numbness in both feet, dorsum more than plantar surfaces.  She has further numbness of the right leg in the peroneal distribution.  She reports that since the perineal surgery the pain that is to be in that distribution improved.  Additionally, she has more severe pain in the right heel.  The heel pain is more recent.     Pain and spine history: She is a 64 y.o. woman with a history of multiple cervical and lumbar surgical procedures who had increased symptoms late last year and had L1/L2 ALIF 08/26/19 (Dr. Saintclair Halsted).  Initially, she felt she was doing better.  A couple weeks after the surgery, she started to experience dizziness.  Two to three weeks later (mid to late February) she began to notice weakness and numbness in her left side.  This started with numbness in the left leg ut then she also experienced numbness in the left face.    She has a pressure sensation in her left face, in and near the eye.    She has mild numbness off/on in the hand but it is mild.    She [place] [facility] [place] for pain in her left leg/foot.  This has been for  many years.   She had a spinal cord stimulator for a while.   Besides multiple operations on her lumbar spine, she also had a left peroneal release operation in 2019.   She alsois fused from C3 to C7 (hardware C3C4 and C6C7 on last MRI 2017).   Currently she is on fentanyl, Elavil and gabapentin.  MRI and CT images MRI Brain 02/05/2020: The MRI is normal for age with just a single T2/flair hyperintense focus in the left frontal lobe that is unlikely to be significant.  No evidence of MS.  CT lumbar 02/05/2020: L3-S1 PLIF and L1-L2 left lateral fusion/discectomy.  The fusion appears solid.  There is disc protrusion at L2-L3 and facet hypertrophy combining to cause mild spinal stenosis and mild right foraminal narrowing.  MRI cervical 11/13/2015: Anterior fusion from C3-C7.  Hardware is in place at C3-C4 and C6-C7.  There is solid osseous fusion at C4-C5 and C5-C6 but not noted at C3-C4 and C6-C7 consistent with pseudoarthrosis.  There is disc protrusion and facet hypertrophy at C2-C3 causing mild right foraminal narrowing but no nerve root compression.  NCV/EMG 06/02/2020:  1.   Moderate chronic denervation in peroneal innervated muscles on the left consistent with her history of peroneal neuropathy.  There does not appear to be any ongoing process involving that nerve as the nerve conduction studywas normal and there was no abnormal spontaneous activity 2.  No evidence of other mononeuropathy in the legs. 3.   Multilevel chronic radiculopathies consistent with her history.  This involves at least the L3, L4, L5 and S1 nerve roots on the left and the L3, L4 and L5 nerve roots on the right  REVIEW OF SYSTEMS: Constitutional: No fevers, chills, sweats, or change in appetite Eyes: No visual changes, double vision, eye pain Ear, nose and throat: No hearing loss, ear pain, nasal congestion, sore throat Cardiovascular: No chest pain, palpitations Respiratory: No shortness of breath at rest or with  exertion.   No wheezes GastrointestinaI: No nausea, vomiting, diarrhea, abdominal pain, fecal incontinence Genitourinary: No dysuria, urinary retention or frequency.  No nocturia. Musculoskeletal:As above  integumentary: No rash, pruritus, skin lesions Neurological: as above Psychiatric: No depression at this time.  No anxiety Endocrine: No palpitations, diaphoresis, change in appetite, change in weigh or increased thirst Hematologic/Lymphatic: No anemia, purpura, petechiae. Allergic/Immunologic: No itchy/runny eyes, nasal congestion, recent allergic reactions, rashes  ALLERGIES: No Known Allergies  HOME MEDICATIONS:  Current Outpatient Medications:  .  amitriptyline (ELAVIL) 25 MG tablet, Take 25 mg by mouth at bedtime. , Disp: , Rfl:  .  amLODipine (NORVASC) 5 MG tablet, Take 5 mg by mouth daily. , Disp: , Rfl:  .  doxycycline (VIBRAMYCIN) 100 MG capsule, Take 1 capsule (100 mg total) by mouth 2 (two) times daily., Disp: 28 capsule, Rfl: 0 .  ergocalciferol (VITAMIN D2) 1.25 MG (50000 UT) capsule, Take 50,000 Units by mouth once a week., Disp: , Rfl:  .  fentaNYL (DURAGESIC - DOSED MCG/HR) 50 MCG/HR, Place 1 patch onto the skin every 3 (three) days. , Disp: , Rfl:  .  gabapentin (NEURONTIN) 800 MG tablet, Take 800 mg by mouth 4 (four) times daily. , Disp: , Rfl:  .  lamoTRIgine (LAMICTAL) 100 MG tablet, Take 1 tablet (100 mg total) by mouth 2 (two) times daily., Disp: 60 tablet, Rfl: 5 .  pantoprazole (PROTONIX) 40 MG tablet, Take 40 mg by mouth daily., Disp: , Rfl:  .  SYNTHROID 112 MCG tablet, Take 112 mcg by mouth daily before breakfast. , Disp: , Rfl:   PAST MEDICAL HISTORY: Past Medical History:  Diagnosis Date  . Anemia   . Anxiety disorder   . Common bile duct stone   . Graves disease   . History of kidney stones   . Hypertension   . Hypothyroidism   . Iron deficiency   . Lymphedema of leg    leg    PAST SURGICAL HISTORY: Past Surgical History:  Procedure  Laterality Date  . ANTERIOR LAT LUMBAR FUSION N/A 08/26/2019   Procedure: ANTERIOR LATERAL LUMBAR INTERBODY FUSION, LUMBAR ONE LUMBAR TWO;  Surgeon: Kary Kos, MD;  Location: [place];  Service: Neurosurgery;  Laterality: N/A;  anterolateral  . APPENDECTOMY    . BACK SURGERY  1987-2007   X 10 Surgery  . CHOLECYSTECTOMY    . ENTEROSCOPY  11/10/2011   Procedure: ENTEROSCOPY;  Surgeon: Inda Castle, MD;  Location: [facility];  Service: Endoscopy;  Laterality: N/A;  . ERCP  07/19/2011   Procedure: ENDOSCOPIC RETROGRADE CHOLANGIOPANCREATOGRAPHY (ERCP);  Surgeon: Inda Castle, MD;  Location: [facility];  Service: Endoscopy;  Laterality: N/A;  . ESOPHAGOGASTRODUODENOSCOPY  09/2018  . FRACTURE SURGERY Left 2018   left arm fracture  . HOT HEMOSTASIS  11/10/2011   Procedure: HOT HEMOSTASIS (ARGON PLASMA COAGULATION/BICAP);  Surgeon: Inda Castle, MD;  Location: [facility];  Service: Endoscopy;  Laterality: N/A;  FAMILY HISTORY: Family History  Problem Relation Age of Onset  . Colon polyps Father   . Diabetes Father   . Brain cancer Mother   . Colon cancer Neg Hx   . Malignant hyperthermia Neg Hx      PHYSICAL EXAM  There were no vitals filed for this visit.  There is no height or weight on file to calculate BMI.   General: The patient is well-developed and well-nourished and in no acute distress  Neurologic Exam  Mental status: The patient is alert and oriented x 3 at the time of the examination. The patient has apparent normal recent and remote memory, with an apparently normal attention span and concentration ability.   Speech is normal.  Cranial nerves: Extraocular movements are full. Facial strength is normal.   Motor:  Muscle bulk is normal.   Tone is normal. Strength is  4+/5 in the peroneal innervated muscles of the left leg and appeared to be normal elsewhere..  Sensory: On exam, she had mild numbness in the left peroneal distribution of the left foot.  Sensation  was fairly normal on the right though she reported some altered sensation in the distribution of the medial plantar. Nerve  Coordination: Cerebellar testing reveals good finger-nose-finger and heel-to-shin bilaterally.  Gait and station: Station is normal.   Gait is normal. Tandem gait is normal. Romberg is negative.   Reflexes: Deep tendon reflexes are symmetric and normal in arms and 1+ at knees and ankles.  Marland Kitchen     DIAGNOSTIC DATA (LABS, IMAGING, TESTING) - I reviewed patient records, labs, notes, testing and imaging myself where available.  Lab Results  Component Value Date   WBC 4.8 03/29/2020   HGB 14.9 03/29/2020   HCT 45.7 03/29/2020   MCV 91.8 03/29/2020   PLT 120 (L) 03/29/2020      Component Value Date/Time   NA 142 03/29/2020 1252   K 3.8 03/29/2020 1252   CL 107 03/29/2020 1252   CO2 23 03/29/2020 1252   GLUCOSE 109 (H) 03/29/2020 1252   BUN 16 03/29/2020 1252   CREATININE 0.79 03/29/2020 1252   CALCIUM 9.0 03/29/2020 1252   PROT 6.7 03/29/2020 1252   ALBUMIN 3.6 03/29/2020 1252   AST 23 03/29/2020 1252   ALT 22 03/29/2020 1252   ALKPHOS 133 (H) 03/29/2020 1252   BILITOT 0.5 03/29/2020 1252   GFRNONAA >60 03/29/2020 1252   GFRAA >60 03/29/2020 1252       ASSESSMENT AND PLAN  Dysesthesia  Numbness - Plan: NCV with EMG(electromyography)  History of fusion of cervical spine  History of fusion of lumbar spine  Common peroneal neuropathy, left   1.   She has evidence of multiple chronic radiculopathies and some evidence of superimposed left chronic peroneal neuropathy.  There is no acute denervation noted.  The peroneal nerve had a normal response so appears to be improved after her surgery. 2.   She is doing better on lamotrigine.  I will increase the dose to see if her benefit improves further.3 3.   Return if new or worsening neurologic symptoms.  Raeshaun Simson A. Felecia Shelling, MD, [facility] 16/05/9603, 5:40 PM Certified in Neurology, Clinical  Neurophysiology, Sleep Medicine and Neuroimaging  [facility] [address], [place], [place] [address] [phone]

## 2020-06-23 ENCOUNTER — Ambulatory Visit (INDEPENDENT_AMBULATORY_CARE_PROVIDER_SITE_OTHER): Payer: Medicare Other

## 2020-06-23 ENCOUNTER — Encounter: Payer: Self-pay | Admitting: Podiatry

## 2020-06-23 ENCOUNTER — Ambulatory Visit (INDEPENDENT_AMBULATORY_CARE_PROVIDER_SITE_OTHER): Payer: Medicare Other | Admitting: Podiatry

## 2020-06-23 ENCOUNTER — Other Ambulatory Visit: Payer: Self-pay

## 2020-06-23 DIAGNOSIS — M722 Plantar fascial fibromatosis: Secondary | ICD-10-CM

## 2020-06-23 MED ORDER — METHYLPREDNISOLONE 4 MG PO TBPK: ORAL_TABLET | ORAL | 0 refills | Status: DC

## 2020-06-23 MED ORDER — MELOXICAM 15 MG PO TABS: 15.0000 mg | ORAL_TABLET | Freq: Every day | ORAL | 3 refills | Status: DC

## 2020-06-23 NOTE — [facility] Notes (Signed)
Subjective:  Patient ID: Katie Gaines, female    DOB: 09-12-1955,  MRN: [id number] HPI Chief Complaint  Patient presents with  . Plantar Fasciitis    the right heel is bothering and has been for about 2 months     64 y.o. female presents with the above complaint.   ROS: Denies fever chills nausea vomiting muscle aches pains calf pain back pain chest pain shortness of breath.  Past Medical History:  Diagnosis Date  . Anemia   . Anxiety disorder   . Common bile duct stone   . Graves disease   . History of kidney stones   . Hypertension   . Hypothyroidism   . Iron deficiency   . Lymphedema of leg    leg   Past Surgical History:  Procedure Laterality Date  . ANTERIOR LAT LUMBAR FUSION N/A 08/26/2019   Procedure: ANTERIOR LATERAL LUMBAR INTERBODY FUSION, LUMBAR ONE LUMBAR TWO;  Surgeon: Kary Kos, MD;  Location: [place];  Service: Neurosurgery;  Laterality: N/A;  anterolateral  . APPENDECTOMY    . BACK SURGERY  1987-2007   X 10 Surgery  . CHOLECYSTECTOMY    . ENTEROSCOPY  11/10/2011   Procedure: ENTEROSCOPY;  Surgeon: Inda Castle, MD;  Location: [facility];  Service: Endoscopy;  Laterality: N/A;  . ERCP  07/19/2011   Procedure: ENDOSCOPIC RETROGRADE CHOLANGIOPANCREATOGRAPHY (ERCP);  Surgeon: Inda Castle, MD;  Location: [facility];  Service: Endoscopy;  Laterality: N/A;  . ESOPHAGOGASTRODUODENOSCOPY  09/2018  . FRACTURE SURGERY Left 2018   left arm fracture  . HOT HEMOSTASIS  11/10/2011   Procedure: HOT HEMOSTASIS (ARGON PLASMA COAGULATION/BICAP);  Surgeon: Inda Castle, MD;  Location: [facility];  Service: Endoscopy;  Laterality: N/A;    Current Outpatient Medications:  .  amitriptyline (ELAVIL) 25 MG tablet, Take 25 mg by mouth at bedtime. , Disp: , Rfl:  .  amLODipine (NORVASC) 5 MG tablet, Take 5 mg by mouth daily. , Disp: , Rfl:  .  ergocalciferol (VITAMIN D2) 1.25 MG (50000 UT) capsule, Take 50,000 Units by mouth once a week., Disp: , Rfl:  .  fentaNYL  (DURAGESIC - DOSED MCG/HR) 50 MCG/HR, Place 1 patch onto the skin every 3 (three) days. , Disp: , Rfl:  .  gabapentin (NEURONTIN) 800 MG tablet, Take 800 mg by mouth 4 (four) times daily. , Disp: , Rfl:  .  lamoTRIgine (LAMICTAL) 150 MG tablet, Take 1 tablet (150 mg total) by mouth 2 (two) times daily., Disp: 60 tablet, Rfl: 11 .  meclizine (ANTIVERT) 25 MG tablet, Take 25 mg by mouth 3 (three) times daily as needed., Disp: , Rfl:  .  meloxicam (MOBIC) 15 MG tablet, Take 1 tablet (15 mg total) by mouth daily., Disp: 30 tablet, Rfl: 3 .  methylPREDNISolone (MEDROL DOSEPAK) 4 MG TBPK tablet, 6 day dose pack - take as directed, Disp: 21 tablet, Rfl: 0 .  mupirocin ointment (BACTROBAN) 2 %, SMARTSIG:1 Application Topical 2-3 Times Daily, Disp: , Rfl:  .  nitroGLYCERIN (NITROSTAT) 0.4 MG SL tablet, Place under the tongue., Disp: , Rfl:  .  pantoprazole (PROTONIX) 40 MG tablet, Take 40 mg by mouth daily., Disp: , Rfl:  .  SYNTHROID 112 MCG tablet, Take 112 mcg by mouth daily before breakfast. , Disp: , Rfl:   No Known Allergies Review of Systems Objective:  There were no vitals filed for this visit.  General: Well developed, nourished, in no acute distress, alert and oriented x3   Dermatological:  Skin is warm, dry and supple bilateral. Nails x 10 are well maintained; remaining integument appears unremarkable at this time. There are no open sores, no preulcerative lesions, no rash or signs of infection present.  Vascular: Dorsalis Pedis artery and Posterior Tibial artery pedal pulses are 2/4 bilateral with immedate capillary fill time. Pedal hair growth present. No varicosities and no lower extremity edema present bilateral.   Neruologic: Grossly intact via light touch bilateral. Vibratory intact via tuning fork bilateral. Protective threshold with Semmes Wienstein monofilament intact to all pedal sites bilateral. Patellar and Achilles deep tendon reflexes 2+ bilateral. No Babinski or clonus noted  bilateral.   Musculoskeletal: No gross boney pedal deformities bilateral. No pain, crepitus, or limitation noted with foot and ankle range of motion bilateral. Muscular strength 5/5 in all groups tested bilateral.  She has severe pain on palpation medial calcaneal tubercle of the right heel today.  No pain on medial lateral compression of calcaneus no pain on palpation of the lateral aspect of the foot.  Gait: Unassisted, Nonantalgic.    Radiographs:  Radiographs taken today demonstrate plantar distally oriented calcaneal heel spur soft tissue increase in density plantar skin insertion site of the right heel.  No acute findings are noted.  No fractures of the heel bone are noted.  Assessment & Plan:   Assessment: Plantar fasciitis right foot.  Plan: Discussed etiology pathology conservative versus surgical therapies.  At this point injected her right heel today 20 mg Kenalog 5 mg Marcaine.  Start her on Medrol Dosepak to be followed by meloxicam.  Placed her in plantar fascial brace.  Discussed appropriate shoe gear stretching exercise ice therapy sugar modifications.  I will follow-up with her in 1 month     Stylianos Stradling T. [place], [place]

## 2020-06-23 NOTE — Patient Instructions (Signed)

## 2020-06-30 ENCOUNTER — Encounter: Payer: Self-pay | Admitting: Neurology

## 2020-06-30 ENCOUNTER — Ambulatory Visit (INDEPENDENT_AMBULATORY_CARE_PROVIDER_SITE_OTHER): Payer: Medicare Other | Admitting: Neurology

## 2020-06-30 VITALS — BP 148/93 | HR 80 | Ht 66.0 in | Wt 160.0 lb

## 2020-06-30 DIAGNOSIS — R208 Other disturbances of skin sensation: Secondary | ICD-10-CM

## 2020-06-30 DIAGNOSIS — Z981 Arthrodesis status: Secondary | ICD-10-CM

## 2020-06-30 DIAGNOSIS — R200 Anesthesia of skin: Secondary | ICD-10-CM

## 2020-06-30 NOTE — Progress Notes (Addendum)
[facility]  PATIENT: Katie Gaines DOB: [birth year]  REFERRING DOCTOR OR PCP:  Dr. Saintclair Halsted ([facility]); PCP is Anastasia Pall SOURCE: patient, notes from Dr. Saintclair Halsted, Dr. Melford Aase nad Dr Marcelline Deist  _________________________________   HISTORICAL  CHIEF COMPLAINT:  Chief Complaint  Patient presents with  . Dysesthesia    rm 13, 3 month FU "saw foot doctor- severe fascitis, have brace"    HISTORY OF PRESENT ILLNESS:  Katie Gaines is a 64 year old woman with a history of multiple cervical and lumbar surgical procedures who has pain.  Marland Kitchen   Update 06/30/2020: Since the last visit, she feels better.   She no longer has any shaking sensation and less dysesthesia since starting lamotrigine.   She has only rarely felt the facial numbness that she was experiencing.    At the last visit, we increased her lamotrigine dose.  She tolerates it well.     She has had some foot pain  She saw podiatry for plantar fasciitis and had an injection and was placed on a steroid pack followed by meloxiacam.   She has a h/o Grave's disease and was initially treated with radiation and then Synthroid.  More recently, she reports she was hypothyroidism.   Pain and spine history: She is a 64 y.o. woman with a history of multiple cervical and lumbar surgical procedures who had increased symptoms late last year and had L1/L2 ALIF 08/26/19 (Dr. Saintclair Halsted).  Initially, she felt she was doing better.  A couple weeks after the surgery, she started to experience dizziness.  Two to three weeks later (mid to late February) she began to notice weakness and numbness in her left side.  This started with numbness in the left leg ut then she also experienced numbness in the left face.    She has a pressure sensation in her left face, in and near the eye.    She has mild numbness off/on in the hand but it is mild.    She sees [facility] [place] for pain in her left leg/foot.  This has been for many years.   She  had a spinal cord stimulator for a while.   Besides multiple operations on her lumbar spine, she also had a left peroneal release operation in 2019.   She alsois fused from C3 to C7 (hardware C3C4 and C6C7 on last MRI 2017).   Currently she is on fentanyl, Elavil and gabapentin.  MRI and CT images MRI Brain 02/05/2020: The MRI is normal for age with just a single T2/flair hyperintense focus in the left frontal lobe that is unlikely to be significant.  No evidence of MS.  CT lumbar 02/05/2020: L3-S1 PLIF and L1-L2 left lateral fusion/discectomy.  The fusion appears solid.  There is disc protrusion at L2-L3 and facet hypertrophy combining to cause mild spinal stenosis and mild right foraminal narrowing.  MRI cervical 11/13/2015: Anterior fusion from C3-C7.  Hardware is in place at C3-C4 and C6-C7.  There is solid osseous fusion at C4-C5 and C5-C6 but not noted at C3-C4 and C6-C7 consistent with pseudoarthrosis.  There is disc protrusion and facet hypertrophy at C2-C3 causing mild right foraminal narrowing but no nerve root compression.  NCV/EMG 06/02/2020:  1.   Moderate chronic denervation in peroneal innervated muscles on the left consistent with her history of peroneal neuropathy.  There does not appear to be any ongoing process involving that nerve as the nerve conduction studywas normal and there was no abnormal  spontaneous activity 2.   No evidence of other mononeuropathy in the legs. 3.   Multilevel chronic radiculopathies consistent with her history.  This involves at least the L3, L4, L5 and S1 nerve roots on the left and the L3, L4 and L5 nerve roots on the right   REVIEW OF SYSTEMS: Constitutional: No fevers, chills, sweats, or change in appetite Eyes: No visual changes, double vision, eye pain Ear, nose and throat: No hearing loss, ear pain, nasal congestion, sore throat Cardiovascular: No chest pain, palpitations Respiratory: No shortness of breath at rest or with exertion.   No  wheezes GastrointestinaI: No nausea, vomiting, diarrhea, abdominal pain, fecal incontinence Genitourinary: No dysuria, urinary retention or frequency.  No nocturia. Musculoskeletal:As above  integumentary: No rash, pruritus, skin lesions Neurological: as above Psychiatric: No depression at this time.  No anxiety Endocrine: No palpitations, diaphoresis, change in appetite, change in weigh or increased thirst Hematologic/Lymphatic: No anemia, purpura, petechiae. Allergic/Immunologic: No itchy/runny eyes, nasal congestion, recent allergic reactions, rashes  ALLERGIES: No Known Allergies  HOME MEDICATIONS:  Current Outpatient Medications:  .  amitriptyline (ELAVIL) 25 MG tablet, Take 25 mg by mouth at bedtime. , Disp: , Rfl:  .  amLODipine (NORVASC) 5 MG tablet, Take 5 mg by mouth daily. , Disp: , Rfl:  .  ergocalciferol (VITAMIN D2) 1.25 MG (50000 UT) capsule, Take 50,000 Units by mouth once a week., Disp: , Rfl:  .  [START ON 07/13/2020] fentaNYL (DURAGESIC) 25 MCG/HR, Place onto the skin. 06/30/20 every 2 days, Disp: , Rfl:  .  gabapentin (NEURONTIN) 800 MG tablet, Take 800 mg by mouth 4 (four) times daily. , Disp: , Rfl:  .  lamoTRIgine (LAMICTAL) 150 MG tablet, Take 1 tablet (150 mg total) by mouth 2 (two) times daily., Disp: 60 tablet, Rfl: 11 .  meclizine (ANTIVERT) 25 MG tablet, Take 25 mg by mouth 3 (three) times daily as needed., Disp: , Rfl:  .  meloxicam (MOBIC) 15 MG tablet, Take 1 tablet (15 mg total) by mouth daily., Disp: 30 tablet, Rfl: 3 .  mupirocin ointment (BACTROBAN) 2 %, SMARTSIG:1 Application Topical 2-3 Times Daily, Disp: , Rfl:  .  nitroGLYCERIN (NITROSTAT) 0.4 MG SL tablet, Place under the tongue., Disp: , Rfl:  .  pantoprazole (PROTONIX) 40 MG tablet, Take 40 mg by mouth daily., Disp: , Rfl:  .  rosuvastatin (CRESTOR) 10 MG tablet, Take 10 mg by mouth at bedtime., Disp: , Rfl:  .  SYNTHROID 125 MCG tablet, Take 125 mcg by mouth daily., Disp: , Rfl:   PAST  MEDICAL HISTORY: Past Medical History:  Diagnosis Date  . Anemia   . Anxiety disorder   . Common bile duct stone   . Graves disease   . History of kidney stones   . Hypertension   . Hypothyroidism   . Iron deficiency   . Lymphedema of leg    leg    PAST SURGICAL HISTORY: Past Surgical History:  Procedure Laterality Date  . ANTERIOR LAT LUMBAR FUSION N/A 08/26/2019   Procedure: ANTERIOR LATERAL LUMBAR INTERBODY FUSION, LUMBAR ONE LUMBAR TWO;  Surgeon: Kary Kos, MD;  Location: [facility];  Service: Neurosurgery;  Laterality: N/A;  anterolateral  . APPENDECTOMY    . BACK SURGERY  1987-2007   X 10 Surgery  . CHOLECYSTECTOMY    . ENTEROSCOPY  11/10/2011   Procedure: ENTEROSCOPY;  Surgeon: Inda Castle, MD;  Location: [facility];  Service: Endoscopy;  Laterality: N/A;  . ERCP  07/19/2011  Procedure: ENDOSCOPIC RETROGRADE CHOLANGIOPANCREATOGRAPHY (ERCP);  Surgeon: Inda Castle, MD;  Location: [facility];  Service: Endoscopy;  Laterality: N/A;  . ESOPHAGOGASTRODUODENOSCOPY  09/2018  . FRACTURE SURGERY Left 2018   left arm fracture  . HOT HEMOSTASIS  11/10/2011   Procedure: HOT HEMOSTASIS (ARGON PLASMA COAGULATION/BICAP);  Surgeon: Inda Castle, MD;  Location: [facility];  Service: Endoscopy;  Laterality: N/A;    FAMILY HISTORY: Family History  Problem Relation Age of Onset  . Colon polyps Father   . Diabetes Father   . Brain cancer Mother   . Colon cancer Neg Hx   . Malignant hyperthermia Neg Hx     SOCIAL HISTORY:  Social History   Socioeconomic History  . Marital status: Married    Spouse name: Not on file  . Number of children: Not on file  . Years of education: Not on file  . Highest education level: Not on file  Occupational History  . Not on file  Tobacco Use  . Smoking status: Current Some Day Smoker    Years: 35.00    Types: Cigarettes  . Smokeless tobacco: Never Used  Vaping Use  . Vaping Use: Never used  Substance and Sexual Activity  .  Alcohol use: Yes    Alcohol/week: 0.0 standard drinks    Comment: 1 drink per month   . Drug use: No  . Sexual activity: Not on file  Other Topics Concern  . Not on file  Social History Narrative   Caffeine: 1 soda daily   Social Determinants of Health   Financial Resource Strain:   . Difficulty of Paying Living Expenses: Not on file  Food Insecurity:   . Worried About Charity fundraiser in the Last Year: Not on file  . Ran Out of Food in the Last Year: Not on file  Transportation Needs:   . Lack of Transportation (Medical): Not on file  . Lack of Transportation (Non-Medical): Not on file  Physical Activity:   . Days of Exercise per Week: Not on file  . Minutes of Exercise per Session: Not on file  Stress:   . Feeling of Stress : Not on file  Social Connections:   . Frequency of Communication with Friends and Family: Not on file  . Frequency of Social Gatherings with Friends and Family: Not on file  . Attends Religious Services: Not on file  . Active Member of Clubs or Organizations: Not on file  . Attends Archivist Meetings: Not on file  . Marital Status: Not on file  Intimate Partner Violence:   . Fear of Current or Ex-Partner: Not on file  . Emotionally Abused: Not on file  . Physically Abused: Not on file  . Sexually Abused: Not on file     PHYSICAL EXAM  Vitals:   06/30/20 1443  BP: (!) 148/93  Pulse: 80  Weight: 160 lb (72.6 kg)  Height: 5\' 6"  (1.676 m)    Body mass index is 25.82 kg/m.   General: The patient is well-developed and well-nourished and in no acute distress  HEENT:  Head is [place]/AT.  Sclera are anicteric.    Neck: Anterior scars noted.  No bruit.  Range of motion is reduced.  Nontender.  Skin: Extremities are without rash or  edema.  Musculoskeletal: Surgical scars noted in back.  The back is nontender.  Mildly reduced range of motion.  Neurologic Exam  Mental status: The patient is alert and oriented x 3 at the  time of  the examination. The patient has apparent normal recent and remote memory, with an apparently normal attention span and concentration ability.   Speech is normal.  Cranial nerves: Extraocular movements are full.  Facial strength is normal.  Hearing was normal  Motor:  Muscle bulk is normal.   Tone is normal. Strength is  5 / 5 in all 4 extremities.   Sensory: She reported reduced sensation in the left arm relative to the right more noticeable with temperature than touch.  Vibration was more symmetric.  In the legs, she reported reduced sensation in the entire left leg relative to the right with more pronounced numbness in the L5 distribution.  Coordination: Cerebellar testing reveals good finger-nose-finger and heel-to-shin bilaterally.  Gait and station: Station is normal.   Gait is normal.  Tandem gait is mildly wide Romberg is negative.   Reflexes: Deep tendon reflexes are symmetric and normal in arms and 1+ at knees and ankles.      DIAGNOSTIC DATA (LABS, IMAGING, TESTING) - I reviewed patient records, labs, notes, testing and imaging myself where available.  Lab Results  Component Value Date   WBC 4.8 03/29/2020   HGB 14.9 03/29/2020   HCT 45.7 03/29/2020   MCV 91.8 03/29/2020   PLT 120 (L) 03/29/2020      Component Value Date/Time   NA 142 03/29/2020 1252   K 3.8 03/29/2020 1252   CL 107 03/29/2020 1252   CO2 23 03/29/2020 1252   GLUCOSE 109 (H) 03/29/2020 1252   BUN 16 03/29/2020 1252   CREATININE 0.79 03/29/2020 1252   CALCIUM 9.0 03/29/2020 1252   PROT 6.7 03/29/2020 1252   ALBUMIN 3.6 03/29/2020 1252   AST 23 03/29/2020 1252   ALT 22 03/29/2020 1252   ALKPHOS 133 (H) 03/29/2020 1252   BILITOT 0.5 03/29/2020 1252   GFRNONAA >60 03/29/2020 1252   GFRAA >60 03/29/2020 1252      ASSESSMENT AND PLAN  Dysesthesia  History of fusion of cervical spine  Numbness  History of fusion of lumbar spine   1. Continue lamotrigine 150 mg po bid and gabapentin for  dysesthesias 2.  Stay active. 3.   rtc prn new symptoms or increased pain  30-minute office visit with the majority of the time spent face-to-face for history and physical, discussion/counseling and decision-making.  Additional time with record review and documentation.   Kiya Eno A. Felecia Shelling, MD, [facility] 41/10/129, 4:38 PM Certified in Neurology, Clinical Neurophysiology, Sleep Medicine and Neuroimaging  [facility] [address] [place], [place] [address] [phone]

## 2020-06-30 NOTE — Addendum Note (Signed)
Addended by: Britt Bottom on: 06/30/2020 06:16 PM   Modules accepted: Level of Service

## 2020-07-23 ENCOUNTER — Encounter: Payer: Self-pay | Admitting: Podiatry

## 2020-07-23 ENCOUNTER — Ambulatory Visit (INDEPENDENT_AMBULATORY_CARE_PROVIDER_SITE_OTHER): Payer: Medicare Other | Admitting: Podiatry

## 2020-07-23 ENCOUNTER — Other Ambulatory Visit: Payer: Self-pay

## 2020-07-23 DIAGNOSIS — M722 Plantar fascial fibromatosis: Secondary | ICD-10-CM | POA: Diagnosis not present

## 2020-07-23 MED ORDER — TRIAMCINOLONE ACETONIDE 40 MG/ML IJ SUSP
20.0000 mg | Freq: Once | INTRAMUSCULAR | Status: AC
  Administered 2020-07-23: 20 mg

## 2020-07-23 NOTE — Progress Notes (Signed)
She presents today for follow-up of her plantar fasciitis states that is not gone yet as she refers to the right foot.  She said that it does not hurt nearly as bad.  Objective: Vital signs are stable alert oriented x3 there is no erythema edema cellulitis drainage or odor pain on palpation medial calcaneal tubercle of the right foot.  Assessment: Pain in limb secondary to plantar fasciitis.  Plan: Reinjected the right heel today 20 mg Kenalog 5 mg of Marcaine encouraged use of the plantar fascial brace and appropriate shoe gear will follow up with her in about 6 weeks.

## 2020-09-03 ENCOUNTER — Ambulatory Visit (INDEPENDENT_AMBULATORY_CARE_PROVIDER_SITE_OTHER): Payer: Medicare Other | Admitting: Podiatry

## 2020-09-03 ENCOUNTER — Other Ambulatory Visit: Payer: Self-pay

## 2020-09-03 ENCOUNTER — Encounter: Payer: Self-pay | Admitting: Podiatry

## 2020-09-03 DIAGNOSIS — M722 Plantar fascial fibromatosis: Secondary | ICD-10-CM | POA: Diagnosis not present

## 2020-09-03 MED ORDER — TRIAMCINOLONE ACETONIDE 40 MG/ML IJ SUSP
20.0000 mg | Freq: Once | INTRAMUSCULAR | Status: AC
  Administered 2020-09-03: 20 mg

## 2020-09-04 NOTE — Progress Notes (Signed)
She presents today for follow-up of her plantar fasciitis.  Last time she was in was July 23, 2020 for plantar fasciitis states that most days does pretty well does really well for a long time and then all of a sudden it will start to act up and become very painful for a few days.  Objective: Vital signs are stable she alert oriented x3.  Pulses are palpable.  She has pain on palpation medial calcaneal tubercle of the right heel.  Assessment is intractable plantar fasciitis.  Plan: I injected it once again today 20 mg Kenalog 5 mg Marcaine point of maximal tenderness.  Tolerated seizure well without complications we will follow-up with her in 2 months.

## 2020-10-17 ENCOUNTER — Other Ambulatory Visit: Payer: Self-pay | Admitting: Podiatry

## 2020-10-17 NOTE — Telephone Encounter (Signed)
Refilled, further refills per Dr. Milinda Pointer

## 2020-11-03 ENCOUNTER — Ambulatory Visit: Payer: Medicare Other | Admitting: Podiatry

## 2021-02-09 ENCOUNTER — Other Ambulatory Visit: Payer: Self-pay | Admitting: Podiatry

## 2021-02-09 NOTE — Telephone Encounter (Signed)
Please advise 

## 2021-02-18 ENCOUNTER — Other Ambulatory Visit: Payer: Self-pay | Admitting: Neurosurgery

## 2021-03-10 NOTE — Progress Notes (Addendum)
Surgical Instructions    Your procedure is scheduled on Wednesday August 10th.  Report to [facility] Main Entrance "A" at 11:30 A.M., then check in with the Admitting office.  Call this number if you have problems the morning of surgery:  [phone]   If you have any questions prior to your surgery date call [phone]: Open Monday-Friday 8am-4pm    Remember:  Do not eat or drink anything after midnight the night before your surgery    Take these medicines the morning of surgery with A SIP OF WATER  amLODipine (NORVASC) 5 MG tablet gabapentin (NEURONTIN) 800 MG tablet lamoTRIgine (LAMICTAL) 150 MG tablet pantoprazole (PROTONIX) 40 MG tablet SYNTHROID 125 MCG tablet  IF NEEDED meclizine (ANTIVERT) 25 MG tablet nitroGLYCERIN (NITROSTAT) 0.4 MG SL tablet   As of today, STOP taking any Aspirin (unless otherwise instructed by your surgeon) meloxicam (MOBIC) 15 MG tablet, Aleve, Naproxen, Ibuprofen, Motrin, Advil, Goody's, BC's, all herbal medications, fish oil, and all vitamins.          Do not wear jewelry or makeup Do not wear lotions, powders, perfumes, or deodorant. Do not shave 48 hours prior to surgery.   Do not bring valuables to the hospital. DO Not wear nail polish, gel polish, artificial nails, or any other type of covering on  natural nails including finger and toenails. If patients have artificial nails, gel coating, etc. that need to be removed by a nail salon please have this removed prior to surgery or surgery may need to be canceled/delayed if the surgeon/ anesthesia feels like the patient is unable to be adequately monitored.             [facility] is not responsible for any belongings or valuables.  Do NOT Smoke (Tobacco/Vaping) or drink Alcohol 24 hours prior to your procedure If you use a CPAP at night, you may bring all equipment for your overnight stay.   Contacts, glasses, dentures or bridgework may not be worn into surgery, please bring cases for these  belongings   For patients admitted to the hospital, discharge time will be determined by your treatment team.   Patients discharged the day of surgery will not be allowed to drive home, and someone needs to stay with them for 24 hours.  ONLY 1 SUPPORT PERSON MAY BE PRESENT WHILE YOU ARE IN SURGERY. IF YOU ARE TO BE ADMITTED ONCE YOU ARE IN YOUR ROOM YOU WILL BE ALLOWED TWO (2) VISITORS.  Minor children may have two parents present. Special consideration for safety and communication needs will be reviewed on a case by case basis.  Special instructions:    Oral Hygiene is also important to reduce your risk of infection.  Remember - BRUSH YOUR TEETH THE MORNING OF SURGERY WITH YOUR REGULAR TOOTHPASTE   [facility]- Preparing For Surgery  Before surgery, you can play an important role. Because skin is not sterile, your skin needs to be as free of germs as possible. You can reduce the number of germs on your skin by washing with CHG (chlorahexidine gluconate) Soap before surgery.  CHG is an antiseptic cleaner which kills germs and bonds with the skin to continue killing germs even after washing.     Please do not use if you have an allergy to CHG or antibacterial soaps. If your skin becomes reddened/irritated stop using the CHG.  Do not shave (including legs and underarms) for at least 48 hours prior to first CHG shower. It is OK to shave  your face.  Please follow these instructions carefully.     Shower the NIGHT BEFORE SURGERY and the MORNING OF SURGERY with CHG Soap.   If you chose to wash your hair, wash your hair first as usual with your normal shampoo. After you shampoo, rinse your hair and body thoroughly to remove the shampoo.  Then [employer] and genitals (private parts) with your normal soap and rinse thoroughly to remove soap.  After that Use CHG Soap as you would any other liquid soap. You can apply CHG directly to the skin and wash gently with a scrungie or a clean washcloth.    Apply the CHG Soap to your body ONLY FROM THE NECK DOWN.  Do not use on open wounds or open sores. Avoid contact with your eyes, ears, mouth and genitals (private parts). Wash Face and genitals (private parts)  with your normal soap.   Wash thoroughly, paying special attention to the area where your surgery will be performed.  Thoroughly rinse your body with warm water from the neck down.  DO NOT shower/wash with your normal soap after using and rinsing off the CHG Soap.  Pat yourself dry with a CLEAN TOWEL.  Wear CLEAN PAJAMAS to bed the night before surgery  Place CLEAN SHEETS on your bed the night before your surgery  DO NOT SLEEP WITH PETS.   Day of Surgery:  Take a shower with CHG soap. Wear Clean/Comfortable clothing the morning of surgery Do not apply any deodorants/lotions.   Remember to brush your teeth WITH YOUR REGULAR TOOTHPASTE.   Please read over the following fact sheets that you were given.

## 2021-03-11 ENCOUNTER — Other Ambulatory Visit: Payer: Self-pay

## 2021-03-11 ENCOUNTER — Encounter (HOSPITAL_COMMUNITY)
Admission: RE | Admit: 2021-03-11 | Discharge: 2021-03-11 | Disposition: A | Discharge status: Home / Self Care | Payer: Medicare Other | Source: Ambulatory Visit | Attending: Neurosurgery | Admitting: Neurosurgery

## 2021-03-11 ENCOUNTER — Encounter (HOSPITAL_COMMUNITY): Payer: Self-pay

## 2021-03-11 DIAGNOSIS — Z01818 Encounter for other preprocedural examination: Secondary | ICD-10-CM | POA: Insufficient documentation

## 2021-03-11 LAB — CBC
HCT: 45.3 % (ref 36.0–46.0)
Hemoglobin: 14.9 g/dL (ref 12.0–15.0)
MCH: 30.3 pg (ref 26.0–34.0)
MCHC: 32.9 g/dL (ref 30.0–36.0)
MCV: 92.3 fL (ref 80.0–100.0)
Platelets: 237 10*3/uL (ref 150–400)
RBC: 4.91 MIL/uL (ref 3.87–5.11)
RDW: 13.6 % (ref 11.5–15.5)
WBC: 7 10*3/uL (ref 4.0–10.5)
nRBC: 0 % (ref 0.0–0.2)

## 2021-03-11 LAB — TYPE AND SCREEN
ABO/RH(D): O POS
Antibody Screen: NEGATIVE

## 2021-03-11 LAB — BASIC METABOLIC PANEL
Anion gap: 6 (ref 5–15)
BUN: 16 mg/dL (ref 8–23)
CO2: 29 mmol/L (ref 22–32)
Calcium: 9.5 mg/dL (ref 8.9–10.3)
Chloride: 107 mmol/L (ref 98–111)
Creatinine, Ser: 0.77 mg/dL (ref 0.44–1.00)
GFR, Estimated: >60 mL/min (ref >60)
Glucose, Bld: 94 mg/dL (ref 70–99)
Potassium: 4.2 mmol/L (ref 3.5–5.1)
Sodium: 142 mmol/L (ref 135–145)

## 2021-03-11 LAB — SURGICAL PCR SCREEN
MRSA, PCR: NEGATIVE
Staphylococcus aureus: NEGATIVE

## 2021-03-15 ENCOUNTER — Other Ambulatory Visit: Payer: Self-pay | Admitting: Neurosurgery

## 2021-03-15 DIAGNOSIS — M5126 Other intervertebral disc displacement, lumbar region: Secondary | ICD-10-CM

## 2021-03-16 ENCOUNTER — Other Ambulatory Visit: Payer: Self-pay

## 2021-03-16 ENCOUNTER — Ambulatory Visit
Admission: RE | Admit: 2021-03-16 | Discharge: 2021-03-16 | Disposition: A | Discharge status: Home / Self Care | Payer: Medicare Other | Source: Ambulatory Visit | Attending: Neurosurgery | Admitting: Neurosurgery

## 2021-03-16 DIAGNOSIS — M5126 Other intervertebral disc displacement, lumbar region: Secondary | ICD-10-CM

## 2021-03-16 LAB — SARS CORONAVIRUS 2 (TAT 6-24 HRS): SARS Coronavirus 2: NEGATIVE

## 2021-03-17 ENCOUNTER — Inpatient Hospital Stay (HOSPITAL_COMMUNITY)
Admission: RE | Admit: 2021-03-17 | Discharge: 2021-03-18 | DRG: 455 | Disposition: A | Discharge status: Home / Self Care | Payer: Medicare Other | Attending: Neurosurgery | Admitting: Neurosurgery

## 2021-03-17 ENCOUNTER — Inpatient Hospital Stay (HOSPITAL_COMMUNITY): Payer: Medicare Other | Admitting: Anesthesiology

## 2021-03-17 ENCOUNTER — Inpatient Hospital Stay (HOSPITAL_COMMUNITY): Payer: Medicare Other

## 2021-03-17 ENCOUNTER — Other Ambulatory Visit: Payer: Self-pay

## 2021-03-17 ENCOUNTER — Encounter (HOSPITAL_COMMUNITY): Payer: Self-pay | Admitting: Neurosurgery

## 2021-03-17 ENCOUNTER — Encounter (HOSPITAL_COMMUNITY)
Admission: RE | Disposition: A | Discharge status: Home / Self Care | Payer: Self-pay | Source: Home / Self Care | Attending: Neurosurgery

## 2021-03-17 DIAGNOSIS — M4326 Fusion of spine, lumbar region: Secondary | ICD-10-CM | POA: Diagnosis present

## 2021-03-17 DIAGNOSIS — Z79899 Other long term (current) drug therapy: Secondary | ICD-10-CM

## 2021-03-17 DIAGNOSIS — I10 Essential (primary) hypertension: Secondary | ICD-10-CM | POA: Diagnosis not present

## 2021-03-17 DIAGNOSIS — Z808 Family history of malignant neoplasm of other organs or systems: Secondary | ICD-10-CM | POA: Diagnosis not present

## 2021-03-17 DIAGNOSIS — Z833 Family history of diabetes mellitus: Secondary | ICD-10-CM | POA: Diagnosis not present

## 2021-03-17 DIAGNOSIS — M48061 Spinal stenosis, lumbar region without neurogenic claudication: Secondary | ICD-10-CM | POA: Diagnosis not present

## 2021-03-17 DIAGNOSIS — Z87891 Personal history of nicotine dependence: Secondary | ICD-10-CM | POA: Diagnosis not present

## 2021-03-17 DIAGNOSIS — Z20822 Contact with and (suspected) exposure to covid-19: Secondary | ICD-10-CM | POA: Diagnosis not present

## 2021-03-17 DIAGNOSIS — Z419 Encounter for procedure for purposes other than remedying health state, unspecified: Secondary | ICD-10-CM

## 2021-03-17 DIAGNOSIS — E039 Hypothyroidism, unspecified: Secondary | ICD-10-CM | POA: Diagnosis present

## 2021-03-17 DIAGNOSIS — M5126 Other intervertebral disc displacement, lumbar region: Principal | ICD-10-CM | POA: Diagnosis present

## 2021-03-17 DIAGNOSIS — Z79890 Hormone replacement therapy (postmenopausal): Secondary | ICD-10-CM

## 2021-03-17 SURGERY — POSTERIOR LUMBAR FUSION 1 WITH HARDWARE REMOVAL
Anesthesia: General | Site: Back

## 2021-03-17 MED ORDER — SODIUM CHLORIDE 0.9 % IV SOLN: 250.0000 mL | INTRAVENOUS | Status: DC

## 2021-03-17 MED ORDER — FENTANYL CITRATE (PF) 100 MCG/2ML IJ SOLN
25.0000 ug | INTRAMUSCULAR | Status: DC | PRN
  Administered 2021-03-17: 50 ug via INTRAVENOUS

## 2021-03-17 MED ORDER — DEXAMETHASONE SODIUM PHOSPHATE 10 MG/ML IJ SOLN: 10.0000 mg | Freq: Once | INTRAMUSCULAR | Status: DC

## 2021-03-17 MED ORDER — ACETAMINOPHEN 500 MG PO TABS: 1000.0000 mg | ORAL_TABLET | Freq: Once | ORAL | Status: DC

## 2021-03-17 MED ORDER — FENTANYL CITRATE (PF) 100 MCG/2ML IJ SOLN
INTRAMUSCULAR | Status: AC
  Filled 2021-03-17: qty 2

## 2021-03-17 MED ORDER — SODIUM CHLORIDE 0.9% FLUSH: 3.0000 mL | INTRAVENOUS | Status: DC | PRN

## 2021-03-17 MED ORDER — AMLODIPINE BESYLATE 5 MG PO TABS
5.0000 mg | ORAL_TABLET | Freq: Every day | ORAL | Status: DC
  Administered 2021-03-18: 5 mg via ORAL
  Filled 2021-03-17: qty 1

## 2021-03-17 MED ORDER — CHLORHEXIDINE GLUCONATE CLOTH 2 % EX PADS: 6.0000 | MEDICATED_PAD | Freq: Once | CUTANEOUS | Status: DC

## 2021-03-17 MED ORDER — HYDROMORPHONE HCL 1 MG/ML IJ SOLN: 1.0000 mg | INTRAMUSCULAR | Status: DC | PRN

## 2021-03-17 MED ORDER — ROSUVASTATIN CALCIUM 5 MG PO TABS
10.0000 mg | ORAL_TABLET | Freq: Every day | ORAL | Status: DC
  Administered 2021-03-17: 10 mg via ORAL
  Filled 2021-03-17: qty 2

## 2021-03-17 MED ORDER — EPHEDRINE SULFATE-NACL 50-0.9 MG/10ML-% IV SOSY
PREFILLED_SYRINGE | INTRAVENOUS | Status: DC | PRN
  Administered 2021-03-17: 5 mg via INTRAVENOUS

## 2021-03-17 MED ORDER — LIDOCAINE 2% (20 MG/ML) 5 ML SYRINGE
INTRAMUSCULAR | Status: AC
  Filled 2021-03-17: qty 5

## 2021-03-17 MED ORDER — THROMBIN 20000 UNITS EX SOLR
CUTANEOUS | Status: AC
  Filled 2021-03-17: qty 20000

## 2021-03-17 MED ORDER — PROPOFOL 10 MG/ML IV BOLUS
INTRAVENOUS | Status: AC
  Filled 2021-03-17: qty 20

## 2021-03-17 MED ORDER — CHLORHEXIDINE GLUCONATE 0.12 % MT SOLN: 15.0000 mL | Freq: Once | OROMUCOSAL | Status: AC

## 2021-03-17 MED ORDER — EPHEDRINE 5 MG/ML INJ
INTRAVENOUS | Status: AC
  Filled 2021-03-17: qty 5

## 2021-03-17 MED ORDER — ROCURONIUM BROMIDE 10 MG/ML (PF) SYRINGE
PREFILLED_SYRINGE | INTRAVENOUS | Status: DC | PRN
  Administered 2021-03-17 (×2): 50 mg via INTRAVENOUS

## 2021-03-17 MED ORDER — CEFAZOLIN SODIUM-DEXTROSE 2-4 GM/100ML-% IV SOLN
2.0000 g | Freq: Three times a day (TID) | INTRAVENOUS | Status: AC
Start: 2021-03-17 — End: 2021-03-18
  Administered 2021-03-17 – 2021-03-18 (×2): 2 g via INTRAVENOUS
  Filled 2021-03-17 (×2): qty 100

## 2021-03-17 MED ORDER — AMITRIPTYLINE HCL 25 MG PO TABS
25.0000 mg | ORAL_TABLET | Freq: Every day | ORAL | Status: DC
  Administered 2021-03-17: 25 mg via ORAL
  Filled 2021-03-17: qty 1

## 2021-03-17 MED ORDER — CYCLOBENZAPRINE HCL 5 MG PO TABS
5.0000 mg | ORAL_TABLET | Freq: Three times a day (TID) | ORAL | Status: DC
  Administered 2021-03-17 – 2021-03-18 (×3): 5 mg via ORAL
  Filled 2021-03-17 (×2): qty 1

## 2021-03-17 MED ORDER — LACTATED RINGERS IV SOLN: INTRAVENOUS | Status: DC | PRN

## 2021-03-17 MED ORDER — VITAMIN D (ERGOCALCIFEROL) 1.25 MG (50000 UNIT) PO CAPS: 50000.0000 [IU] | ORAL_CAPSULE | ORAL | Status: DC

## 2021-03-17 MED ORDER — CHLORHEXIDINE GLUCONATE 0.12 % MT SOLN
OROMUCOSAL | Status: AC
  Administered 2021-03-17: 15 mL via OROMUCOSAL
  Filled 2021-03-17: qty 15

## 2021-03-17 MED ORDER — MIDAZOLAM HCL 2 MG/2ML IJ SOLN
INTRAMUSCULAR | Status: DC | PRN
  Administered 2021-03-17: 2 mg via INTRAVENOUS

## 2021-03-17 MED ORDER — PHENYLEPHRINE HCL-NACL 20-0.9 MG/250ML-% IV SOLN
INTRAVENOUS | Status: DC | PRN
  Administered 2021-03-17: 25 ug/min via INTRAVENOUS

## 2021-03-17 MED ORDER — CYCLOBENZAPRINE HCL 10 MG PO TABS
ORAL_TABLET | ORAL | Status: AC
  Filled 2021-03-17: qty 1

## 2021-03-17 MED ORDER — FENTANYL CITRATE (PF) 250 MCG/5ML IJ SOLN
INTRAMUSCULAR | Status: AC
  Filled 2021-03-17: qty 5

## 2021-03-17 MED ORDER — MENTHOL 3 MG MT LOZG: 1.0000 | LOZENGE | OROMUCOSAL | Status: DC | PRN

## 2021-03-17 MED ORDER — PROPOFOL 10 MG/ML IV BOLUS
INTRAVENOUS | Status: DC | PRN
  Administered 2021-03-17: 150 mg via INTRAVENOUS

## 2021-03-17 MED ORDER — HYDROCODONE-ACETAMINOPHEN 5-325 MG PO TABS: 1.0000 | ORAL_TABLET | ORAL | Status: DC | PRN

## 2021-03-17 MED ORDER — ACETAMINOPHEN 500 MG PO TABS
ORAL_TABLET | ORAL | Status: AC
  Administered 2021-03-17: 1000 mg
  Filled 2021-03-17: qty 2

## 2021-03-17 MED ORDER — LIDOCAINE-EPINEPHRINE 1 %-1:100000 IJ SOLN
INTRAMUSCULAR | Status: AC
  Filled 2021-03-17: qty 1

## 2021-03-17 MED ORDER — ALBUMIN HUMAN 5 % IV SOLN: INTRAVENOUS | Status: DC | PRN

## 2021-03-17 MED ORDER — PROMETHAZINE HCL 25 MG/ML IJ SOLN: 6.2500 mg | INTRAMUSCULAR | Status: DC | PRN

## 2021-03-17 MED ORDER — ORAL CARE MOUTH RINSE: 15.0000 mL | Freq: Once | OROMUCOSAL | Status: AC

## 2021-03-17 MED ORDER — BUPIVACAINE LIPOSOME 1.3 % IJ SUSP
INTRAMUSCULAR | Status: DC | PRN
  Administered 2021-03-17: 20 mL

## 2021-03-17 MED ORDER — PHENOL 1.4 % MT LIQD: 1.0000 | OROMUCOSAL | Status: DC | PRN

## 2021-03-17 MED ORDER — GABAPENTIN 400 MG PO CAPS
800.0000 mg | ORAL_CAPSULE | Freq: Four times a day (QID) | ORAL | Status: DC
  Administered 2021-03-17 – 2021-03-18 (×3): 800 mg via ORAL
  Filled 2021-03-17 (×3): qty 2

## 2021-03-17 MED ORDER — ONDANSETRON HCL 4 MG PO TABS: 4.0000 mg | ORAL_TABLET | Freq: Four times a day (QID) | ORAL | Status: DC | PRN

## 2021-03-17 MED ORDER — LACTATED RINGERS IV SOLN: INTRAVENOUS | Status: DC

## 2021-03-17 MED ORDER — ONDANSETRON HCL 4 MG/2ML IJ SOLN: 4.0000 mg | Freq: Four times a day (QID) | INTRAMUSCULAR | Status: DC | PRN

## 2021-03-17 MED ORDER — DEXAMETHASONE SODIUM PHOSPHATE 10 MG/ML IJ SOLN
INTRAMUSCULAR | Status: AC
  Filled 2021-03-17: qty 1

## 2021-03-17 MED ORDER — FENTANYL CITRATE (PF) 100 MCG/2ML IJ SOLN
25.0000 ug | INTRAMUSCULAR | Status: DC | PRN
  Administered 2021-03-17: 50 ug via INTRAVENOUS
  Administered 2021-03-17 (×2): 25 ug via INTRAVENOUS
  Administered 2021-03-17: 50 ug via INTRAVENOUS

## 2021-03-17 MED ORDER — BUPIVACAINE LIPOSOME 1.3 % IJ SUSP
INTRAMUSCULAR | Status: AC
  Filled 2021-03-17: qty 20

## 2021-03-17 MED ORDER — LIDOCAINE-EPINEPHRINE 1 %-1:100000 IJ SOLN
INTRAMUSCULAR | Status: DC | PRN
  Administered 2021-03-17: 10 mL

## 2021-03-17 MED ORDER — ACETAMINOPHEN 325 MG PO TABS
650.0000 mg | ORAL_TABLET | ORAL | Status: DC | PRN
  Administered 2021-03-17 – 2021-03-18 (×2): 650 mg via ORAL
  Filled 2021-03-17 (×2): qty 2

## 2021-03-17 MED ORDER — ONDANSETRON HCL 4 MG/2ML IJ SOLN
INTRAMUSCULAR | Status: DC | PRN
  Administered 2021-03-17: 4 mg via INTRAVENOUS

## 2021-03-17 MED ORDER — SUGAMMADEX SODIUM 200 MG/2ML IV SOLN
INTRAVENOUS | Status: DC | PRN
  Administered 2021-03-17 (×2): 200 mg via INTRAVENOUS

## 2021-03-17 MED ORDER — DEXAMETHASONE SODIUM PHOSPHATE 10 MG/ML IJ SOLN
INTRAMUSCULAR | Status: DC | PRN
Start: 2021-03-17 — End: 2021-03-17
  Administered 2021-03-17: 8 mg via INTRAVENOUS

## 2021-03-17 MED ORDER — SODIUM CHLORIDE 0.9% FLUSH: 3.0000 mL | Freq: Two times a day (BID) | INTRAVENOUS | Status: DC

## 2021-03-17 MED ORDER — CEFAZOLIN SODIUM-DEXTROSE 2-4 GM/100ML-% IV SOLN
INTRAVENOUS | Status: AC
  Filled 2021-03-17: qty 100

## 2021-03-17 MED ORDER — THROMBIN 20000 UNITS EX SOLR
CUTANEOUS | Status: DC | PRN
  Administered 2021-03-17: 20 mL via TOPICAL

## 2021-03-17 MED ORDER — LEVOTHYROXINE SODIUM 25 MCG PO TABS
125.0000 ug | ORAL_TABLET | Freq: Every day | ORAL | Status: DC
  Administered 2021-03-18: 125 ug via ORAL

## 2021-03-17 MED ORDER — ROCURONIUM BROMIDE 10 MG/ML (PF) SYRINGE
PREFILLED_SYRINGE | INTRAVENOUS | Status: AC
  Filled 2021-03-17: qty 10

## 2021-03-17 MED ORDER — CEFAZOLIN SODIUM-DEXTROSE 2-4 GM/100ML-% IV SOLN
2.0000 g | INTRAVENOUS | Status: AC
  Administered 2021-03-17: 2 g via INTRAVENOUS

## 2021-03-17 MED ORDER — 0.9 % SODIUM CHLORIDE (POUR BTL) OPTIME
TOPICAL | Status: DC | PRN
  Administered 2021-03-17: 1000 mL

## 2021-03-17 MED ORDER — ONDANSETRON HCL 4 MG/2ML IJ SOLN
INTRAMUSCULAR | Status: AC
  Filled 2021-03-17: qty 2

## 2021-03-17 MED ORDER — MIDAZOLAM HCL 2 MG/2ML IJ SOLN
INTRAMUSCULAR | Status: AC
  Filled 2021-03-17: qty 2

## 2021-03-17 MED ORDER — LAMOTRIGINE 150 MG PO TABS
150.0000 mg | ORAL_TABLET | Freq: Two times a day (BID) | ORAL | Status: DC
  Administered 2021-03-17 – 2021-03-18 (×2): 150 mg via ORAL
  Filled 2021-03-17 (×2): qty 1

## 2021-03-17 MED ORDER — PHENYLEPHRINE 40 MCG/ML (10ML) SYRINGE FOR IV PUSH (FOR BLOOD PRESSURE SUPPORT)
PREFILLED_SYRINGE | INTRAVENOUS | Status: AC
  Filled 2021-03-17: qty 10

## 2021-03-17 MED ORDER — OXYCODONE HCL 5 MG PO TABS
10.0000 mg | ORAL_TABLET | ORAL | Status: DC | PRN
  Administered 2021-03-17 – 2021-03-18 (×5): 10 mg via ORAL
  Filled 2021-03-17 (×5): qty 2

## 2021-03-17 MED ORDER — ACETAMINOPHEN 650 MG RE SUPP: 650.0000 mg | RECTAL | Status: DC | PRN

## 2021-03-17 MED ORDER — LIDOCAINE 2% (20 MG/ML) 5 ML SYRINGE
INTRAMUSCULAR | Status: DC | PRN
  Administered 2021-03-17: 60 mg via INTRAVENOUS

## 2021-03-17 MED ORDER — FENTANYL CITRATE (PF) 250 MCG/5ML IJ SOLN
INTRAMUSCULAR | Status: DC | PRN
  Administered 2021-03-17 (×2): 50 ug via INTRAVENOUS
  Administered 2021-03-17: 100 ug via INTRAVENOUS
  Administered 2021-03-17: 50 ug via INTRAVENOUS

## 2021-03-17 SURGICAL SUPPLY — 77 items
ADH SKN CLS APL DERMABOND .7 (GAUZE/BANDAGES/DRESSINGS) ×1
APL SKNCLS STERI-STRIP NONHPOA (GAUZE/BANDAGES/DRESSINGS) ×1
BAG COUNTER SPONGE SURGICOUNT (BAG) ×3 IMPLANT
BAG SPNG CNTER NS LX DISP (BAG) ×2
BAG SURGICOUNT SPONGE COUNTING (BAG) ×2
BASKET BONE COLLECTION (BASKET) ×3 IMPLANT
BENZOIN TINCTURE PRP APPL 2/3 (GAUZE/BANDAGES/DRESSINGS) ×3 IMPLANT
BLADE CLIPPER SURG (BLADE) IMPLANT
BLADE SURG 11 STRL SS (BLADE) ×3 IMPLANT
BONE VIVIGEN FORMABLE 5.4CC (Bone Implant) ×3 IMPLANT
BUR CUTTER 7.0 ROUND (BURR) ×3 IMPLANT
BUR MATCHSTICK NEURO 3.0 LAGG (BURR) ×3 IMPLANT
CANISTER SUCT 3000ML PPV (MISCELLANEOUS) ×3 IMPLANT
CARTRIDGE OIL MAESTRO DRILL (MISCELLANEOUS) ×1 IMPLANT
CLOSURE WOUND 1/2 X4 (GAUZE/BANDAGES/DRESSINGS) ×1
CNTNR URN SCR LID CUP LEK RST (MISCELLANEOUS) ×1 IMPLANT
CONT SPEC 4OZ STRL OR WHT (MISCELLANEOUS) ×3
COVER BACK TABLE 60X90IN (DRAPES) ×3 IMPLANT
DECANTER SPIKE VIAL GLASS SM (MISCELLANEOUS) ×1 IMPLANT
DERMABOND ADVANCED (GAUZE/BANDAGES/DRESSINGS) ×2
DERMABOND ADVANCED .7 DNX12 (GAUZE/BANDAGES/DRESSINGS) ×1 IMPLANT
DIFFUSER DRILL AIR PNEUMATIC (MISCELLANEOUS) ×3 IMPLANT
DRAPE C-ARM 42X72 X-RAY (DRAPES) ×6 IMPLANT
DRAPE C-ARMOR (DRAPES) IMPLANT
DRAPE HALF SHEET 40X57 (DRAPES) IMPLANT
DRAPE LAPAROTOMY 100X72X124 (DRAPES) ×3 IMPLANT
DRAPE SURG 17X23 STRL (DRAPES) ×3 IMPLANT
DRSG OPSITE 4X5.5 SM (GAUZE/BANDAGES/DRESSINGS) ×3 IMPLANT
DRSG OPSITE POSTOP 4X6 (GAUZE/BANDAGES/DRESSINGS) ×3 IMPLANT
DURAPREP 26ML APPLICATOR (WOUND CARE) ×3 IMPLANT
ELECT REM PT RETURN 9FT ADLT (ELECTROSURGICAL) ×3
ELECTRODE REM PT RTRN 9FT ADLT (ELECTROSURGICAL) ×1 IMPLANT
EVACUATOR 1/8 PVC DRAIN (DRAIN) ×2 IMPLANT
EVACUATOR 3/16  PVC DRAIN (DRAIN)
EVACUATOR 3/16 PVC DRAIN (DRAIN) ×1 IMPLANT
GAUZE 4X4 16PLY ~~LOC~~+RFID DBL (SPONGE) ×2 IMPLANT
GAUZE SPONGE 4X4 12PLY STRL (GAUZE/BANDAGES/DRESSINGS) ×3 IMPLANT
GLOVE EXAM NITRILE XL STR (GLOVE) IMPLANT
GLOVE SURG ENC MOIS LTX SZ7 (GLOVE) IMPLANT
GLOVE SURG ENC MOIS LTX SZ8 (GLOVE) ×6 IMPLANT
GLOVE SURG UNDER LTX SZ8.5 (GLOVE) ×6 IMPLANT
GLOVE SURG UNDER POLY LF SZ7 (GLOVE) IMPLANT
GOWN STRL REUS W/ TWL LRG LVL3 (GOWN DISPOSABLE) IMPLANT
GOWN STRL REUS W/ TWL XL LVL3 (GOWN DISPOSABLE) ×2 IMPLANT
GOWN STRL REUS W/TWL 2XL LVL3 (GOWN DISPOSABLE) IMPLANT
GOWN STRL REUS W/TWL LRG LVL3 (GOWN DISPOSABLE) ×3
GOWN STRL REUS W/TWL XL LVL3 (GOWN DISPOSABLE) ×12
GRAFT BNE MATRIX VG FRMBL MD 5 (Bone Implant) IMPLANT
GRAFT BONE PROTEIOS LRG 5CC (Orthopedic Implant) ×2 IMPLANT
KIT BASIN OR (CUSTOM PROCEDURE TRAY) ×3 IMPLANT
KIT TURNOVER KIT B (KITS) ×3 IMPLANT
MILL MEDIUM DISP (BLADE) ×3 IMPLANT
NDL HYPO 21X1.5 SAFETY (NEEDLE) ×1 IMPLANT
NDL HYPO 25X1 1.5 SAFETY (NEEDLE) ×1 IMPLANT
NEEDLE HYPO 21X1.5 SAFETY (NEEDLE) ×3 IMPLANT
NEEDLE HYPO 25X1 1.5 SAFETY (NEEDLE) ×3 IMPLANT
NS IRRIG 1000ML POUR BTL (IV SOLUTION) ×3 IMPLANT
OIL CARTRIDGE MAESTRO DRILL (MISCELLANEOUS) ×3
PACK LAMINECTOMY NEURO (CUSTOM PROCEDURE TRAY) ×3 IMPLANT
PAD ARMBOARD 7.5X6 YLW CONV (MISCELLANEOUS) ×9 IMPLANT
ROD PREBENT 6.35X50 (Rod) ×4 IMPLANT
SCREW PEDICLE VA L635 5.5X40M (Screw) ×4 IMPLANT
SCREW SET BREAK OFF (Screw) ×8 IMPLANT
SPACER SUSTAIN TI 8X26X11 8D (Spacer) ×4 IMPLANT
SPONGE SURGIFOAM ABS GEL 100 (HEMOSTASIS) ×3 IMPLANT
SPONGE T-LAP 4X18 ~~LOC~~+RFID (SPONGE) ×4 IMPLANT
STRIP CLOSURE SKIN 1/2X4 (GAUZE/BANDAGES/DRESSINGS) ×3 IMPLANT
SUT VIC AB 0 CT1 18XCR BRD8 (SUTURE) ×2 IMPLANT
SUT VIC AB 0 CT1 8-18 (SUTURE) ×6
SUT VIC AB 2-0 CT1 18 (SUTURE) ×3 IMPLANT
SUT VIC AB 4-0 PS2 27 (SUTURE) ×3 IMPLANT
SYR 20ML LL LF (SYRINGE) ×2 IMPLANT
SYR 5ML LL (SYRINGE) ×2 IMPLANT
TOWEL GREEN STERILE (TOWEL DISPOSABLE) ×3 IMPLANT
TOWEL GREEN STERILE FF (TOWEL DISPOSABLE) ×3 IMPLANT
TRAY FOLEY MTR SLVR 16FR STAT (SET/KITS/TRAYS/PACK) ×3 IMPLANT
WATER STERILE IRR 1000ML POUR (IV SOLUTION) ×3 IMPLANT

## 2021-03-17 NOTE — Progress Notes (Signed)
Orthopedic Tech Progress Note Patient Details:  Katie Gaines 1956/07/03 [id number] Patient has brace Patient ID: [name], female   DOB: 1956/03/25, 65 y.o.   MRN: [id number]  [name] 03/17/2021, 11:56 PM

## 2021-03-17 NOTE — Anesthesia Preprocedure Evaluation (Addendum)
Anesthesia Evaluation  Patient identified by MRN, date of birth, ID band Patient awake    Reviewed: Allergy & Precautions, NPO status , Patient's Chart, lab work & pertinent test results  Airway Mallampati: II  TM Distance: >3 FB Neck ROM: Full    Dental  (+) Teeth Intact, Dental Advisory Given, Caps,    Pulmonary neg pulmonary ROS, former smoker,    Pulmonary exam normal breath sounds clear to auscultation       Cardiovascular hypertension, Pt. on medications Normal cardiovascular exam Rhythm:Regular Rate:Normal     Neuro/Psych PSYCHIATRIC DISORDERS Anxiety  Neuromuscular disease (HNP)    GI/Hepatic Neg liver ROS, GERD  ,  Endo/Other  Hypothyroidism   Renal/GU negative Renal ROS     Musculoskeletal  (+) Arthritis ,   Abdominal   Peds  Hematology negative hematology ROS (+)   Anesthesia Other Findings Day of surgery medications reviewed with the patient.  Reproductive/Obstetrics                            Anesthesia Physical Anesthesia Plan  ASA: 3  Anesthesia Plan: General   Post-op Pain Management:    Induction: Intravenous  PONV Risk Score and Plan: 3 and Midazolam, Dexamethasone and Ondansetron  Airway Management Planned: Oral ETT  Additional Equipment:   Intra-op Plan:   Post-operative Plan: Extubation in OR  Informed Consent: I have reviewed the patients History and Physical, chart, labs and discussed the procedure including the risks, benefits and alternatives for the proposed anesthesia with the patient or authorized representative who has indicated his/her understanding and acceptance.     Dental advisory given  Plan Discussed with: CRNA  Anesthesia Plan Comments:         Anesthesia Quick Evaluation

## 2021-03-17 NOTE — Anesthesia Postprocedure Evaluation (Signed)
Anesthesia Post Note  Patient: BALINDA RESURRECCION  Procedure(s) Performed: Posterior Lumbar Interbody Fusion - Lumbar two-Lumbar three cortical screws globus exploration fusion remove of hardware - Posterior Lateral and Interbody fusion (Back)     Patient location during evaluation: PACU Anesthesia Type: General Level of consciousness: awake and alert Pain management: pain level controlled Vital Signs Assessment: post-procedure vital signs reviewed and stable Respiratory status: spontaneous breathing, nonlabored ventilation, respiratory function stable and patient connected to nasal cannula oxygen Cardiovascular status: blood pressure returned to baseline and stable Postop Assessment: no apparent nausea or vomiting Anesthetic complications: no   No notable events documented.  Last Vitals:  Vitals:   03/17/21 1822 03/17/21 1837  BP: (!) 157/94 (!) 144/96  Pulse: 86 87  Resp: 14 10  Temp:  (!) 36.2 C  SpO2: 98% 98%    Last Pain:  Vitals:   03/17/21 1941  TempSrc:   PainSc: 8                  Catalina Gravel

## 2021-03-17 NOTE — H&P (Signed)
Katie Gaines is an 65 y.o. female.   Chief Complaint: Back and left greater than right leg pain HPI: 65 year old female longstanding issues with her neck and her back presents with progressive worsening left greater than lower right leg pain rating down L3 nerve root pattern.  Work-up revealed disclamation at L2-3 which was a segmental level between 2 previous fusion at L1-L2 and L3 to the sacrum.  She has old hardware at L3-4.  Due to patient's progressive clinical syndrome imaging findings failed conservative treatment I recommended decompressive laminectomy and posterior lumbar interbody fusion at L2-3.  With removal of hardware disconnected the rods and cross-link at L3-4 with placement of new pedicle screws at L2 and connecting the system up.  Preoperative CT scan did show solid fusion L3-4 so we do have the option of removing the L4 screws.  I extensively went over the risks and benefits of this procedure with her as well as perioperative course expectations of outcome and alternatives of surgery and she understood and agreed to proceed forward.  Past Medical History:  Diagnosis Date   Anemia    Anxiety disorder    Common bile duct stone    Graves disease    History of kidney stones    Hypertension    Hypothyroidism    Iron deficiency    Lymphedema of leg    bilateral    Past Surgical History:  Procedure Laterality Date   ANTERIOR LAT LUMBAR FUSION N/A 08/26/2019   Procedure: ANTERIOR LATERAL LUMBAR INTERBODY FUSION, LUMBAR ONE LUMBAR TWO;  Surgeon: Kary Kos, MD;  Location: [place];  Service: Neurosurgery;  Laterality: N/A;  anterolateral   APPENDECTOMY     BACK SURGERY  1987-2007   X 10 Surgery   CHOLECYSTECTOMY     ENTEROSCOPY  11/10/2011   Procedure: ENTEROSCOPY;  Surgeon: Inda Castle, MD;  Location: [facility];  Service: Endoscopy;  Laterality: N/A;   ERCP  07/19/2011   Procedure: ENDOSCOPIC RETROGRADE CHOLANGIOPANCREATOGRAPHY (ERCP);  Surgeon: Inda Castle, MD;   Location: [facility];  Service: Endoscopy;  Laterality: N/A;   ESOPHAGOGASTRODUODENOSCOPY  09/2018   FRACTURE SURGERY Left 2018   left arm fracture   HOT HEMOSTASIS  11/10/2011   Procedure: HOT HEMOSTASIS (ARGON PLASMA COAGULATION/BICAP);  Surgeon: Inda Castle, MD;  Location: [facility];  Service: Endoscopy;  Laterality: N/A;    Family History  Problem Relation Age of Onset   Colon polyps Father    Diabetes Father    Brain cancer Mother    Colon cancer Neg Hx    Malignant hyperthermia Neg Hx    Social History:  reports that she quit smoking about 6 months ago. Her smoking use included cigarettes. She has never used smokeless tobacco. She reports current alcohol use. She reports that she does not use drugs.  Allergies: No Known Allergies  Medications Prior to Admission  Medication Sig Dispense Refill   amitriptyline (ELAVIL) 25 MG tablet Take 25 mg by mouth at bedtime.      amLODipine (NORVASC) 5 MG tablet Take 5 mg by mouth daily.      cyclobenzaprine (FLEXERIL) 5 MG tablet Take 5 mg by mouth 3 (three) times daily.     ergocalciferol (VITAMIN D2) 1.25 MG (50000 UT) capsule Take 50,000 Units by mouth every Sunday.     fentaNYL (DURAGESIC) 25 MCG/HR Place 1 patch onto the skin every other day.     gabapentin (NEURONTIN) 800 MG tablet Take 800 mg by mouth 4 (four)  times daily.      lamoTRIgine (LAMICTAL) 150 MG tablet Take 1 tablet (150 mg total) by mouth 2 (two) times daily. 60 tablet 11   meloxicam (MOBIC) 15 MG tablet TAKE 1 TABLET(15 MG) BY MOUTH DAILY (Patient taking differently: Take 15 mg by mouth daily.) 30 tablet 3   rosuvastatin (CRESTOR) 10 MG tablet Take 10 mg by mouth at bedtime.     SYNTHROID 125 MCG tablet Take 125 mcg by mouth daily before breakfast.     traMADol (ULTRAM) 50 MG tablet Take 50 mg by mouth every 6 (six) hours as needed for severe pain.     nitroGLYCERIN (NITROSTAT) 0.4 MG SL tablet Place 0.4 mg under the tongue every 5 (five) minutes as needed for  chest pain.      No results found for this or any previous visit (from the past 48 hour(s)). CT LUMBAR SPINE WO CONTRAST  Result Date: 03/16/2021 CLINICAL DATA:  Herniated lumbar disc without myelopathy. Prior fusion. EXAM: CT LUMBAR SPINE WITHOUT CONTRAST TECHNIQUE: Multidetector CT imaging of the lumbar spine was performed without intravenous contrast administration. Multiplanar CT image reconstructions were also generated. COMPARISON:  02/07/2020 FINDINGS: Segmentation: 5 lumbar type vertebrae. Alignment: Mild upper lumbar dextroscoliosis. Straightening of the normal lumbar lordosis. No significant listhesis in the sagittal plane. Vertebrae: No acute fracture or suspicious osseous lesion. Prior L1-2 lateral interbody fusion with solid osseous fusion across the disc space. Prior L3-S1 fusion with solid interbody and posterior element osseous fusion at each level. Pedicle screws remain in place bilaterally at L3-4. Old pedicle screw tracks at L5 and S1. Paraspinal and other soft tissues: Postoperative changes in the posterior lumbar soft tissues with a small chronic fluid collection in the L4-5 laminectomy bed. Abdominal aortic atherosclerosis without aneurysm. Punctate nonobstructing right upper pole renal calculus. Mild chronic extrahepatic biliary dilatation, likely physiologic following cholecystectomy. Disc levels: L1-2: Prior fusion. Mild left eccentric endplate spurring without significant stenosis. L2-3: Greatly progressive disc degeneration with severe disc space narrowing, vacuum disc, and prominent degenerative endplate changes. Progressive circumferential disc bulging and severe facet and ligamentum flavum hypertrophy result in mild-to-moderate spinal, lateral recess, and neural foraminal stenosis bilaterally. L3-4 to L5-S1: Prior posterior decompression and fusion. No stenosis. IMPRESSION: 1. 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  2. Solid L1-2 and L3-S1 fusion. 3. Aortic Atherosclerosis (ICD10-I70.0). Electronically Signed   By: Logan Bores M.D.   On: 03/16/2021 16:36    Review of Systems  Blood pressure (!) 177/87, pulse 68, temperature 98 F (36.7 C), temperature source Oral, resp. rate 18, height '5\' 6"'$  (1.676 m), weight 67.6 kg, SpO2 100 %. Physical Exam   Assessment/Plan 65 year old presents for decompressive laminectomy interbody fusion L2-3  Elaina Hoops, MD 03/17/2021, 1:02 PM

## 2021-03-17 NOTE — Op Note (Signed)
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  Posterior lumbar interbody fusion L2-3 utilizing the globus titanium cages packed with locally harvested autograft mixed with vivigen and partialis  3.  Pedicle screw fixation L2-3 utilizing the [employer] 6.35 Legacy pedicle screw system with removal of bilateral L3-4 rods and removal of bilateral L4 screws  4.  Posterior lateral arthrodesis L2-3 utilizing the locally harvested autograft mix  Surgeon: Dominica Severin Serria Sloma  Assistant: Ashok Pall  Anesthesia: General  EBL: 31cc  HPI: 65 year old female with longstanding issues with her back presents with progressive worsening back and bilateral hip and leg pain rating down L3 nerve root pattern work-up revealed disc herniation at L2-3 which was an island disc base between a solid L1-2 fusion and a solid L3-S1 fusion I also showed signs of instability on dynamic films.  Due to progression of clinical syndrome imaging findings and failed conservative treatment I recommended decompressive laminectomy at this level with interbody fusion.  I extensively went over the risks and benefits of the operation with her as well as perioperative course expectations of outcome and alternatives to surgery and she understood and agreed to proceed forward.  Operative procedure: Patient was brought in the OR was [place] general anesthesia positioned prone the Wilson frame her back was prepped and draped in routine sterile fashion.  Roll incision was opened up and extended slightly subperiosteal dissection was carried lamina of L1 and L2 exposing the TPs at L2 bilaterally I then identified the hardware at L3-4 the fusion did appear to be solid I  disconnected the knots and then remove the rods and removed bilateral L4 pedicle screws.  At this point I remove the spinous process at L2 performed a central decompressive laminectomy freed up the scar tissue and performed complete medial facetectomies aggressively marching out and unroofing both foramina at L2 and L3 bilaterally and aggressively under biting the supra articulating facet at L3 to gain access to the lateral margin of disc base.  This space was then incised and cleaned out bilaterally utilizing sequential distraction I selected an 8 mm x 5 mm 8 degree lordotic implant packed it with the locally harvested autograft mixed and inserted sequentially with an extensive mount of autograft mix packed centrally all this was done under fluoroscopy.  Then also under fluoroscopy I placed bilateral L1 pedicle screws.  Both screws had excellent purchase.  I then aggressively decorticated the TPs at L2-3 and packed an extensive mount of autograft mix posterior laterally along the lateral facet joints and TPs.  Then connected up the rod tightened down the L3 screw compressed L2 against L3 inspected the foramina to confirm patency and no migration of graft material lay Gelfoam on top of the dura.  I then injected Exparel in the fascia placed a medium Hemovac drain and closed the wound in layers with empty Vicryl and a running 4 subcuticular.  Dermabond benzoin Steri-Strips and sterile dressing was applied patient recovery in stable condition.  At the end the case all needle counts and sponge counts were correct.

## 2021-03-17 NOTE — Transfer of Care (Signed)
Immediate Anesthesia Transfer of Care Note  Patient: Katie Gaines  Procedure(s) Performed: Posterior Lumbar Interbody Fusion - Lumbar two-Lumbar three cortical screws globus exploration fusion remove of hardware - Posterior Lateral and Interbody fusion (Back)  Patient Location: PACU  Anesthesia Type:General  Level of Consciousness: awake and alert   Airway & Oxygen Therapy: Patient Spontanous Breathing and Patient connected to face mask oxygen  Post-op Assessment: Report given to RN, Post -op Vital signs reviewed and stable and Patient moving all extremities X 4  Post vital signs: Reviewed and stable  Last Vitals:  Vitals Value Taken Time  BP 149/93 03/17/21 1706  Temp 36.2 C 03/17/21 1706  Pulse 98 03/17/21 1712  Resp 14 03/17/21 1712  SpO2 94 % 03/17/21 1712  Vitals shown include unvalidated device data.  Last Pain:  Vitals:   03/17/21 1142  TempSrc:   PainSc: 7       Patients Stated Pain Goal: 3 ([id number])  Complications: No notable events documented.

## 2021-03-17 NOTE — Anesthesia Procedure Notes (Signed)
Procedure Name: Intubation Date/Time: 03/17/2021 2:01 PM Performed by: Kathryne Hitch, CRNA Pre-anesthesia Checklist: Patient identified, Emergency Drugs available, Suction available and Patient being monitored Patient Re-evaluated:Patient Re-evaluated prior to induction Oxygen Delivery Method: Circle system utilized Preoxygenation: Pre-oxygenation with 100% oxygen Induction Type: IV induction Ventilation: Mask ventilation without difficulty Laryngoscope Size: Miller and 3 Grade View: Grade I Tube type: Oral Tube size: 7.5 mm Number of attempts: 1 Airway Equipment and Method: Stylet and Oral airway Placement Confirmation: ETT inserted through vocal cords under direct vision, positive ETCO2 and breath sounds checked- equal and bilateral Secured at: 23 cm Tube secured with: Tape Dental Injury: Teeth and Oropharynx as per pre-operative assessment

## 2021-03-18 NOTE — Evaluation (Signed)
Occupational Therapy Evaluation Patient Details Name: Katie Gaines MRN: [id number] DOB: [birth year] Today's Date: 03/18/2021    History of Present Illness 65 yo f s/p PLIF.  PMH includes: anemia, anxiety, hx of kidney stones, HTn, hypothyroidism, iron deficiency, lymphedema back surg x11.   Clinical Impression   Patient admitted for the above procedure.  PTA she lives with her spouse, who is able to assist as needed.  Barriers is post surgical discomfort.  Currently she is at her baseline for all in room mobility and ADL from a sit/stand level.  Patient has a thorough understanding of all back precautions, and understands all adaptive equipment available.  All questions answered and no further needs in the acute setting.      Follow Up Recommendations  No OT follow up    Equipment Recommendations  None recommended by OT    Recommendations for Other Services       Precautions / Restrictions Precautions Precautions: Back Precaution Booklet Issued: Yes (comment) Precaution Comments: reviewed and demonstrates good understanding Restrictions Weight Bearing Restrictions: No      Mobility Bed Mobility Overal bed mobility: Modified Independent               Patient Response: Cooperative  Transfers Overall transfer level: Independent                    Balance Overall balance assessment: No apparent balance deficits (not formally assessed)                                         ADL either performed or assessed with clinical judgement   ADL Overall ADL's : At baseline                                       General ADL Comments: Independent with in room mobility.  Completed ADL from sit/stand level.     Vision Patient Visual Report: No change from baseline       Perception     Praxis      Pertinent Vitals/Pain Pain Assessment: Faces Faces Pain Scale: Hurts little more Pain Location: operative site Pain Descriptors  / Indicators: Other (Comment);Sore;Tender (stiffness) Pain Intervention(s): Monitored during session     Hand Dominance Right   Extremity/Trunk Assessment Upper Extremity Assessment Upper Extremity Assessment: Defer to OT evaluation   Lower Extremity Assessment Lower Extremity Assessment: Defer to PT evaluation   Cervical / Trunk Assessment Cervical / Trunk Assessment: Other exceptions Cervical / Trunk Exceptions: spine surgery   Communication Communication Communication: No difficulties   Cognition Arousal/Alertness: Awake/alert Behavior During Therapy: WFL for tasks assessed/performed Overall Cognitive Status: Within Functional Limits for tasks assessed                                      Home Living Family/patient expects to be discharged to:: Private residence Living Arrangements: Spouse/significant other Available Help at Discharge: Family;Available 24 hours/day Type of Home: House Home Access: Stairs to enter [employer] of Steps: 2   Home Layout: One level     Bathroom Shower/Tub: Occupational psychologist: Handicapped height Bathroom Accessibility: Yes How Accessible: Accessible via walker Home Equipment: Adaptive equipment;Hand held shower head;Grab bars -  tub/shower;Shower Theme park manager: Reacher        Prior Functioning/Environment Level of Independence: Independent                 OT Problem List: Pain      OT Treatment/Interventions:      OT Goals(Current goals can be found in the care plan section) Acute Rehab OT Goals Patient Stated Goal: Return home and recover OT Goal Formulation: With patient Time For Goal Achievement: 03/18/21 Potential to Achieve Goals: Good  OT Frequency:     Barriers to D/C:  None noted          Co-evaluation              AM-PAC OT "6 Clicks" Daily Activity     Outcome Measure Help from another person eating meals?: None Help from another person taking  care of personal grooming?: None Help from another person toileting, which includes using toliet, bedpan, or urinal?: None Help from another person bathing (including washing, rinsing, drying)?: None Help from another person to put on and taking off regular upper body clothing?: None Help from another person to put on and taking off regular lower body clothing?: None 6 Click Score: 24   End of Session Equipment Utilized During Treatment: Back brace Nurse Communication: Mobility status  Activity Tolerance: Patient tolerated treatment well Patient left: in bed;with call bell/phone within reach  OT Visit Diagnosis: Pain Pain - Right/Left:  (lumbar spine)                Time: [id number] OT Time Calculation (min): 27 min Charges:  OT General Charges $OT Visit: 1 Visit OT Evaluation $OT Eval Moderate Complexity: 1 Mod OT Treatments $Self Care/Home Management : 8-22 mins  03/18/2021  Katie Gaines, [name]  Acute Rehabilitation Services  Office:  [phone]   Katie Gaines 03/18/2021, 8:42 AM

## 2021-03-18 NOTE — Evaluation (Signed)
Physical Therapy Evaluation Patient Details Name: Katie Gaines MRN: [id number] DOB: 1955/11/11 Today's Date: 03/18/2021   History of Present Illness  65 yo female s/p L2-3 lumbar laminectomy/facetectomies/foraminotomies, PLIF L2-3, pedicle screw fixation L2-3, posterior lateral arthrodesis L2-3 on 8/10. PMH includes anemia, anxiety, hx of kidney stones, graves disease, HTN, hypothyroidism, iron deficiency, lymphedema, back/neck surg x10.   Clinical Impression  Pt presents with [facility] strength, mobility, adherence to back precautions post-operatively, and activity tolerance. Pt ambulated good hallway distance, proficiently navigated steps, and PT reviewed importance of back precautions and frequent mobility (up and walking short distances 1x/hour) once d/c home. Pt completed acute PT, no further PT needs. Will sign off, thank you.      Follow Up Recommendations No PT follow up;Follow surgeon's recommendation for DC plan and follow-up therapies;Supervision for mobility/OOB    Equipment Recommendations  None recommended by PT    Recommendations for Other Services       Precautions / Restrictions Precautions Precautions: Back Precaution Booklet Issued: Yes (comment) Precaution Comments: pt recalls 2/3, requires cuing to remember no bending back Required Braces or Orthoses: Spinal Brace Spinal Brace: Lumbar corset;Applied in sitting position Restrictions Weight Bearing Restrictions: No      Mobility  Bed Mobility Overal bed mobility: Modified Independent             General bed mobility comments: up in room    Transfers Overall transfer level: Independent               General transfer comment: WFL rise/sit, no physical assist  Ambulation/Gait Ambulation/Gait assistance: Modified independent (Device/Increase time) Gait Distance (Feet): 150 Feet ((has already been up and walking today)) Assistive device: None Gait Pattern/deviations: Step-through  pattern;WFL(Within Functional Limits);Shuffle Gait velocity: slightly decr   General Gait Details: slightly slowed gait with intermittent shuffling, pt states this is due to previous back surgeries.  Stairs Stairs: Yes Stairs assistance: Supervision Stair Management: One rail Left;Forwards;Step to pattern Number of Stairs: 2 General stair comments: cues for step-to gait for safety  Wheelchair Mobility    Modified Rankin (Stroke Patients Only)       Balance Overall balance assessment: No apparent balance deficits (not formally assessed)                                           Pertinent Vitals/Pain Pain Assessment: Faces Faces Pain Scale: Hurts a little bit Pain Location: operative site Pain Descriptors / Indicators: Discomfort;Sore Pain Intervention(s): Limited activity within patient's tolerance;Monitored during session;Repositioned    Home Living Family/patient expects to be discharged to:: Private residence Living Arrangements: Spouse/significant other Available Help at Discharge: Family;Available 24 hours/day Type of Home: House Home Access: Stairs to enter   [employer] of Steps: 2 Home Layout: One level Home Equipment: Adaptive equipment;Hand held shower head;Grab bars - tub/shower;Shower seat      Prior Function Level of Independence: Independent               Hand Dominance   Dominant Hand: Right    Extremity/Trunk Assessment   Upper Extremity Assessment Upper Extremity Assessment: Defer to OT evaluation    Lower Extremity Assessment Lower Extremity Assessment: Generalized weakness    Cervical / Trunk Assessment Cervical / Trunk Assessment: Other exceptions Cervical / Trunk Exceptions: spine surgery  Communication   Communication: No difficulties  Cognition Arousal/Alertness: Awake/alert Behavior During Therapy: WFL for tasks  assessed/performed Overall Cognitive Status: Within Functional Limits for tasks  assessed                                        General Comments      Exercises     Assessment/Plan    PT Assessment Patent does not need any further PT services  PT Problem List         PT Treatment Interventions      PT Goals (Current goals can be found in the Care Plan section)  Acute Rehab PT Goals Patient Stated Goal: Return home and recover PT Goal Formulation: With patient Time For Goal Achievement: 03/18/21 Potential to Achieve Goals: Good    Frequency     Barriers to discharge        Co-evaluation               AM-PAC PT "6 Clicks" Mobility  Outcome Measure Help needed turning from your back to your side while in a flat bed without using bedrails?: None Help needed moving from lying on your back to sitting on the side of a flat bed without using bedrails?: None Help needed moving to and from a bed to a chair (including a wheelchair)?: None Help needed standing up from a chair using your arms (e.g., wheelchair or bedside chair)?: None Help needed to walk in hospital room?: A Little Help needed climbing 3-5 steps with a railing? : A Little 6 Click Score: 22    End of Session Equipment Utilized During Treatment: Back brace Activity Tolerance: Patient tolerated treatment well Patient left: with call bell/phone within reach;in bed;Other (comment) (sitting EOB, preparing to go to bathroom) Nurse Communication: Mobility status PT Visit Diagnosis: Other abnormalities of gait and mobility (R26.89)    Time: [id number] PT Time Calculation (min) (ACUTE ONLY): 19 min   Charges:   PT Evaluation $PT Eval Low Complexity: 1 Low          Sophiamarie Nease S, PT DPT Acute Rehabilitation Services Pager [phone]  Office [phone]   Roxine Caddy E Ruffin Pyo 03/18/2021, 9:37 AM

## 2021-03-18 NOTE — Progress Notes (Signed)
Patient was transported via wheelchair by volunteer for discharge home; in no acute distress nor complaints of pain nor discomfort; room was checked and accounted for all her belongings; discharge instructions given to patient by RN and she verbalized understanding on the instructions given.

## 2021-03-18 NOTE — Discharge Summary (Signed)
Physician Discharge Summary  Patient ID: Katie Gaines MRN: [id number] DOB/AGE: 09-22-1955 65 y.o. Estimated body mass index is 24.05 kg/m as calculated from the following:   Height as of this encounter: '5\' 6"'$  (1.676 m).   Weight as of this encounter: 67.6 kg.   Admit date: 03/17/2021 Discharge date: 03/18/2021  Admission Diagnoses: Herniated nucleus pulposus L2-3 lumbar spinal stenosis and instability  Discharge Diagnoses: Same Active Problems:   Fusion of spine of lumbar region   Discharged Condition: good  Hospital Course: Patient was admitted to the hospital underwent decompressive laminectomy interbody fusion L2-3 postoperative patient did very well recovered and floor on the floor was ambulating and voiding spontaneously tolerating regular diet pain was stable patient be discharged home with scheduled follow-up 1 to 2 weeks.  Patient is on a fentanyl patch at home so she should be good for her medications.  Consults: Significant Diagnostic Studies: Treatments: Posterior lumbar interbody fusion L2-3 Discharge Exam: Blood pressure 102/70, pulse (!) 104, temperature 98.3 F (36.8 C), temperature source Oral, resp. rate 18, height '5\' 6"'$  (1.676 m), weight 67.6 kg, SpO2 99 %. Strength 5 out of 5 on clean dry and intact  Disposition: Home  Discharge Instructions     Incentive spirometry RT   Complete by: As directed       Allergies as of 03/18/2021   No Known Allergies      Medication List     TAKE these medications    amitriptyline 25 MG tablet Commonly known as: ELAVIL Take 25 mg by mouth at bedtime.   amLODipine 5 MG tablet Commonly known as: NORVASC Take 5 mg by mouth daily.   cyclobenzaprine 5 MG tablet Commonly known as: FLEXERIL Take 5 mg by mouth 3 (three) times daily.   ergocalciferol 1.25 MG (50000 UT) capsule Commonly known as: VITAMIN D2 Take 50,000 Units by mouth every Sunday.   fentaNYL 25 MCG/HR Commonly known as: [place] 1  patch onto the skin every other day.   gabapentin 800 MG tablet Commonly known as: NEURONTIN Take 800 mg by mouth 4 (four) times daily.   lamoTRIgine 150 MG tablet Commonly known as: LaMICtal Take 1 tablet (150 mg total) by mouth 2 (two) times daily.   meloxicam 15 MG tablet Commonly known as: MOBIC TAKE 1 TABLET(15 MG) BY MOUTH DAILY What changed: See the new instructions.   nitroGLYCERIN 0.4 MG SL tablet Commonly known as: NITROSTAT Place 0.4 mg under the tongue every 5 (five) minutes as needed for chest pain.   rosuvastatin 10 MG tablet Commonly known as: CRESTOR Take 10 mg by mouth at bedtime.   Synthroid 125 MCG tablet Generic drug: levothyroxine Take 125 mcg by mouth daily before breakfast.   traMADol 50 MG tablet Commonly known as: ULTRAM Take 50 mg by mouth every 6 (six) hours as needed for severe pain.         Signed: Elaina Hoops 03/18/2021, 7:23 AM

## 2021-03-18 NOTE — Discharge Instructions (Signed)

## 2021-04-03 ENCOUNTER — Other Ambulatory Visit: Payer: Self-pay | Admitting: Podiatry

## 2021-05-01 ENCOUNTER — Other Ambulatory Visit: Payer: Self-pay | Admitting: Neurology

## 2021-05-11 ENCOUNTER — Other Ambulatory Visit: Payer: Self-pay | Admitting: *Deleted

## 2021-05-11 DIAGNOSIS — F17210 Nicotine dependence, cigarettes, uncomplicated: Secondary | ICD-10-CM

## 2021-05-11 DIAGNOSIS — Z87891 Personal history of nicotine dependence: Secondary | ICD-10-CM

## 2021-06-01 ENCOUNTER — Ambulatory Visit (INDEPENDENT_AMBULATORY_CARE_PROVIDER_SITE_OTHER): Payer: Medicare Other | Admitting: Orthopaedic Surgery

## 2021-06-01 ENCOUNTER — Other Ambulatory Visit: Payer: Self-pay

## 2021-06-01 ENCOUNTER — Encounter: Payer: Self-pay | Admitting: Orthopaedic Surgery

## 2021-06-01 ENCOUNTER — Ambulatory Visit: Payer: Self-pay

## 2021-06-01 VITALS — Ht 66.0 in | Wt 151.0 lb

## 2021-06-01 DIAGNOSIS — M87052 Idiopathic aseptic necrosis of left femur: Secondary | ICD-10-CM

## 2021-06-01 DIAGNOSIS — M25552 Pain in left hip: Secondary | ICD-10-CM | POA: Diagnosis not present

## 2021-06-01 NOTE — Progress Notes (Signed)
Office Visit Note   Patient: Katie Gaines           Date of Birth: 07/30/1956           MRN: [id number] Visit Date: 06/01/2021              Requested by: Chesley Noon, MD [place],  [place] [address] PCP: Chesley Noon, MD   Assessment & Plan: Visit Diagnoses:  1. Pain in left hip   2. Avascular necrosis of bone of left hip (HCC)     Plan: The patient does have extensive avascular porosis of her left hip and we are recommending hip replacement surgery.  In the interim I want her to offload that hip use a cane or opposite hand.  We will work on getting her on the schedule hopefully soon.  I did give her handout about hip replacement surgery and showed her hip models and explained in detail what this type of surgery involves.  We discussed the interoperative postoperative course as well as the risk and benefits of surgery.  We will be in touch with her about getting this scheduled.  All questions and concerns were answered and addressed.  Follow-Up Instructions: Return for 2 weeks post-op.   Orders:  Orders Placed This Encounter  Procedures   XR HIP UNILAT W OR W/O PELVIS 2-3 VIEWS LEFT   No orders of the defined types were placed in this encounter.     Procedures: No procedures performed   Clinical Data: No additional findings.   Subjective: Chief Complaint  Patient presents with   Left Hip - Pain  The patient is a 65 year old female sent from Dr. Kary Kos with neurosurgery to evaluate and treat left hip avascular necrosis.  I have seen her MRI.  She does have femoral head collapse as well.  She does have severe left hip and groin pain.  She has had multiple spine surgeries in the past and is on Duragesic patches through pain management.  She is walking with a limp.  She has not used a cane or assistive ice yet but does understand that she does have significant disease in her left hip and she is here today to discuss surgical options.  She is not  a diabetic.  She is a thin individual  HPI  Review of Systems There is no listed headache, chest pain, shortness of breath, fever, chills, nausea, vomiting  Objective: Vital Signs: Ht 5\' 6"  (1.676 m)   Wt 151 lb (68.5 kg)   BMI 24.37 kg/m   Physical Exam She is alert and orient x3 and in no acute distress Ortho Exam Examination of her left hip shows severe pain with internal and external rotation.  The right hip moves smoothly. Specialty Comments:  No specialty comments available.  Imaging: XR HIP UNILAT W OR W/O PELVIS 2-3 VIEWS LEFT  Result Date: 06/01/2021 An AP pelvis and lateral left hip shows severe joint space narrowing which is quite significant on the left side.  There is also irregularity of the femoral head suggesting subchondral collapse with avascular necrosis.   The MRI of her left hip shows extensive edema in the femoral head and neck consistent with a rash necrosis.  There is also some subchondral fracturing as well.  PMFS History: Patient Active Problem List   Diagnosis Date Noted   Fusion of spine of lumbar region 03/17/2021   Common peroneal neuropathy, left 06/02/2020   Dysesthesia 03/16/2020  Numbness 03/16/2020   Visual changes 03/16/2020   History of fusion of cervical spine 03/16/2020   History of fusion of lumbar spine 03/16/2020   Fatigue 11/29/2019   DDD (degenerative disc disease), lumbar 08/26/2019   Gastroesophageal reflux disease 06/17/2019   Vitamin D deficiency 06/17/2019   Irritable bowel syndrome with diarrhea 03/06/2019   Closed fracture of distal end of radius 10/04/2018   Injury of right wrist 09/13/2018   Low back pain 07/15/2016   Pain syndrome, chronic 09/09/2012   Postlaminectomy syndrome, not elsewhere classified 09/09/2012   Anemia 05/30/2012   Essential hypertension 02/13/2012   Chronic gastrointestinal bleeding 01/04/2012   Hypothyroidism 01/04/2012   Obstruction of bile duct 07/19/2011   Abnormal radiographic  examination 07/19/2011   IRON DEFICIENCY 09/10/2009   Past Medical History:  Diagnosis Date   Anemia    Anxiety disorder    Common bile duct stone    Graves disease    History of kidney stones    Hypertension    Hypothyroidism    Iron deficiency    Lymphedema of leg    bilateral    Family History  Problem Relation Age of Onset   Colon polyps Father    Diabetes Father    Brain cancer Mother    Colon cancer Neg Hx    Malignant hyperthermia Neg Hx     Past Surgical History:  Procedure Laterality Date   ANTERIOR LAT LUMBAR FUSION N/A 08/26/2019   Procedure: ANTERIOR LATERAL LUMBAR INTERBODY FUSION, LUMBAR ONE LUMBAR TWO;  Surgeon: Kary Kos, MD;  Location: [facility];  Service: Neurosurgery;  Laterality: N/A;  anterolateral   APPENDECTOMY     BACK SURGERY  1987-2007   X 10 Surgery   CHOLECYSTECTOMY     ENTEROSCOPY  11/10/2011   Procedure: ENTEROSCOPY;  Surgeon: Inda Castle, MD;  Location: [facility];  Service: Endoscopy;  Laterality: N/A;   ERCP  07/19/2011   Procedure: ENDOSCOPIC RETROGRADE CHOLANGIOPANCREATOGRAPHY (ERCP);  Surgeon: Inda Castle, MD;  Location: [facility];  Service: Endoscopy;  Laterality: N/A;   ESOPHAGOGASTRODUODENOSCOPY  09/2018   FRACTURE SURGERY Left 2018   left arm fracture   HOT HEMOSTASIS  11/10/2011   Procedure: HOT HEMOSTASIS (ARGON PLASMA COAGULATION/BICAP);  Surgeon: Inda Castle, MD;  Location: [facility];  Service: Endoscopy;  Laterality: N/A;   Social History   Occupational History   Not on file  Tobacco Use   Smoking status: Former    Years: 35.00    Types: Cigarettes    Quit date: 09/08/2020    Years since quitting: 0.7   Smokeless tobacco: Never  Vaping Use   Vaping Use: Never used  Substance and Sexual Activity   Alcohol use: Yes    Alcohol/week: 0.0 standard drinks    Comment: 1 drink per month    Drug use: No   Sexual activity: Not on file

## 2021-06-04 ENCOUNTER — Other Ambulatory Visit: Payer: Self-pay

## 2021-06-08 ENCOUNTER — Telehealth: Payer: Self-pay | Admitting: Orthopaedic Surgery

## 2021-06-08 ENCOUNTER — Encounter: Payer: Self-pay | Admitting: Orthopaedic Surgery

## 2021-06-08 NOTE — Telephone Encounter (Signed)
Pt states her left leg is a lot worse now since the last time she was here. She said it feels almost broke. She feels like a bone is going come out of her shin.   CB [phone]

## 2021-06-08 NOTE — Telephone Encounter (Signed)
Patient portal message sent to the pt to have her come see [place] in the morning

## 2021-06-08 NOTE — Telephone Encounter (Signed)
Appt scheduled

## 2021-06-09 ENCOUNTER — Ambulatory Visit: Payer: Self-pay

## 2021-06-09 ENCOUNTER — Encounter: Payer: Self-pay | Admitting: Orthopaedic Surgery

## 2021-06-09 ENCOUNTER — Ambulatory Visit (INDEPENDENT_AMBULATORY_CARE_PROVIDER_SITE_OTHER): Payer: Medicare Other | Admitting: Orthopaedic Surgery

## 2021-06-09 DIAGNOSIS — M79605 Pain in left leg: Secondary | ICD-10-CM

## 2021-06-09 NOTE — Progress Notes (Signed)
The patient is well-known to [place].  We saw her just last week with avascular necrosis of her left hip with subchondral collapse.  I have had her offloading that hip using a cane and she is set up for a left hip replacement on November 18.  2 nights ago she started having worsening pain with her left tibia and fibula and left foot and ankle and feels like "the bone is coming out of the skin".  She denies any injury.  She is in chronic pain management and has had multiple spine operations.  Her left foot and ankle and tibia showed no acute findings.  The skin is intact.  There is no redness or swelling.  She is able to flex and extend her foot and ankle.  X-rays of the tibia and fibula on the left side as well as the foot and ankle show no evidence of any type of acute injury or fracture.  The bone is aligned normally there is no soft tissue swelling.  I gave reassurance that this may be just coming from her hip and her back.  She should still stay off of this left lower extremity is much as possible and can ambulate with a cane or a wheelchair.  She did asked me about pain medications but she is in chronic pain management and we cannot address that with her.  She is tearful.  She understands that we will address her hip when she is taken surgery November 18.  We would then see her back in 2 weeks postoperative.

## 2021-06-11 NOTE — Progress Notes (Signed)
Please enter orders for PAT visit scheduled for 06-17-21

## 2021-06-15 ENCOUNTER — Other Ambulatory Visit: Payer: Self-pay | Admitting: Physician Assistant

## 2021-06-15 DIAGNOSIS — M87052 Idiopathic aseptic necrosis of left femur: Secondary | ICD-10-CM

## 2021-06-15 NOTE — Patient Instructions (Addendum)
DUE TO COVID-19 ONLY ONE VISITOR IS ALLOWED TO COME WITH YOU AND STAY IN THE WAITING ROOM ONLY DURING PRE OP AND PROCEDURE DAY OF SURGERY IF YOU ARE GOING HOME AFTER SURGERY. IF YOU ARE SPENDING THE NIGHT 2 PEOPLE MAY VISIT WITH YOU IN YOUR PRIVATE ROOM AFTER SURGERY UNTIL VISITING  HOURS ARE OVER AT 8:00 PM AND 1  VISITOR  MAY  SPEND THE NIGHT.   YOU NEED TO HAVE A COVID 19 TEST ON__11/16____ THIS TEST MUST BE DONE BEFORE SURGERY,  COVID TESTING SITE  IS LOCATED AT [place], [place]. REMAIN IN YOUR CAR THIS IS A DRIVE UP TEST. AFTER YOUR COVID TEST PLEASE WEAR A MASK OUT IN PUBLIC AND SOCIAL DISTANCE AND [place] YOUR HANDS FREQUENTLY, ALSO ASK ALL YOUR CLOSE CONTACT PERSONS TO WEAR A MASK AND SOCIAL DISTANCE AND [place] THEIR HANDS FREQUENTLY ALSO.               Katie Gaines   Your procedure is scheduled on: 06/25/21   Report to [facility] Main  Entrance   Report to admitting at 8:15 AM     Call this number if you have problems the morning of surgery [phone]    No food after midnight.    You may have clear liquid until 8:00 AM.    At 7:30 AM drink pre surgery drink.   Nothing by mouth after 8:00 AM.   CLEAR LIQUID DIET   Foods Allowed                                                                     Foods Excluded                             liquids that you cannot  Plain Jell-O any favor except red or purple                                           see through such as: Fruit ices (not with fruit pulp)                                     milk, soups, orange juice  Iced Popsicles                                    All solid food Carbonated beverages, regular and diet                                    Cranberry, grape and apple juices Sports drinks like Gatorade Lightly seasoned clear broth or consume(fat free) Sugar    BRUSH YOUR TEETH MORNING OF SURGERY AND RINSE YOUR MOUTH OUT, NO CHEWING GUM CANDY OR MINTS.     Take these medicines the  morning of surgery with A SIP OF WATER: Lamotragine, Elivil, Gabapentin, Amlodipine, Synthroid  You may not have any metal on your body including hair pins and              piercings  Do not wear jewelry, make-up, lotions, powders or perfumes, deodorant             Do not wear nail polish on your fingernails.  Do not shave  48 hours prior to surgery.              .   Do not bring valuables to the hospital. [place].  Contacts, dentures or bridgework may not be worn into surgery.  Leave suitcase in the car. After surgery it may be brought to your room.                 Please read over the following fact sheets you were given: _____________________________________________________________________           [facility] - Preparing for Surgery Before surgery, you can play an important role.  Because skin is not sterile, your skin needs to be as free of germs as possible.  You can reduce the number of germs on your skin by washing with CHG (chlorahexidine gluconate) soap before surgery.  CHG is an antiseptic cleaner which kills germs and bonds with the skin to continue killing germs even after washing. Please DO NOT use if you have an allergy to CHG or antibacterial soaps.  If your skin becomes reddened/irritated stop using the CHG and inform your nurse when you arrive at Short Stay. Do not shave (including legs and underarms) for at least 48 hours prior to the first CHG shower.   Please follow these instructions carefully:  1.  Shower with CHG Soap the night before surgery and the  morning of Surgery.  2.  If you choose to wash your hair, wash your hair first as usual with your  normal  shampoo.  3.  After you shampoo, rinse your hair and body thoroughly to remove the  shampoo.                            4.  Use CHG as you would any other liquid soap.  You can apply chg directly  to the skin and wash                        Gently with a scrungie or clean washcloth.  5.  Apply the CHG Soap to your body ONLY FROM THE NECK DOWN.   Do not use on face/ open                           Wound or open sores. Avoid contact with eyes, ears mouth and genitals (private parts).                       Wash face,  Genitals (private parts) with your normal soap.             6.  Wash thoroughly, paying special attention to the area where your surgery  will be performed.  7.  Thoroughly rinse your body with warm water from the neck down.  8.  DO NOT shower/wash with your normal soap after using and rinsing off  the CHG Soap.  9.  Pat yourself dry with a clean towel.            10.  Wear clean pajamas.            11.  Place clean sheets on your bed the night of your first shower and do not  sleep with pets. Day of Surgery : Do not apply any lotions/deodorants the morning of surgery.  Please wear clean clothes to the hospital/surgery center.  FAILURE TO FOLLOW THESE INSTRUCTIONS MAY RESULT IN THE CANCELLATION OF YOUR SURGERY PATIENT SIGNATURE_________________________________  NURSE SIGNATURE__________________________________  ________________________________________________________________________   Katie Gaines  An incentive spirometer is a tool that can help keep your lungs clear and active. This tool measures how well you are filling your lungs with each breath. Taking long deep breaths may help reverse or decrease the chance of developing breathing (pulmonary) problems (especially infection) following: A long period of time when you are unable to move or be active. BEFORE THE PROCEDURE  If the spirometer includes an indicator to show your best effort, your nurse or respiratory therapist will set it to a desired goal. If possible, sit up straight or lean slightly forward. Try not to slouch. Hold the incentive spirometer in an upright position. INSTRUCTIONS FOR USE  Sit on the edge of your bed if  possible, or sit up as far as you can in bed or on a chair. Hold the incentive spirometer in an upright position. Breathe out normally. Place the mouthpiece in your mouth and seal your lips tightly around it. Breathe in slowly and as deeply as possible, raising the piston or the ball toward the top of the column. Hold your breath for 3-5 seconds or for as long as possible. Allow the piston or ball to fall to the bottom of the column. Remove the mouthpiece from your mouth and breathe out normally. Rest for a few seconds and repeat Steps 1 through 7 at least 10 times every 1-2 hours when you are awake. Take your time and take a few normal breaths between deep breaths. The spirometer may include an indicator to show your best effort. Use the indicator as a goal to work toward during each repetition. After each set of 10 deep breaths, practice coughing to be sure your lungs are clear. If you have an incision (the cut made at the time of surgery), support your incision when coughing by placing a pillow or rolled up towels firmly against it. Once you are able to get out of bed, walk around indoors and cough well. You may stop using the incentive spirometer when instructed by your caregiver.  RISKS AND COMPLICATIONS Take your time so you do not get dizzy or light-headed. If you are in pain, you may need to take or ask for pain medication before doing incentive spirometry. It is harder to take a deep breath if you are having pain. AFTER USE Rest and breathe slowly and easily. It can be helpful to keep track of a log of your progress. Your caregiver can provide you with a simple table to help with this. If you are using the spirometer at home, follow these instructions: [place] IF:  You are having difficultly using the spirometer. You have trouble using the spirometer as often as instructed. Your pain medication is not giving enough relief while using the spirometer. You develop fever of  100.5 F (38.1 C) or higher. SEEK IMMEDIATE MEDICAL CARE IF:  You cough up bloody sputum that had  not been present before. You develop fever of 102 F (38.9 C) or greater. You develop worsening pain at or near the incision site. MAKE SURE YOU:  Understand these instructions. Will watch your condition. Will get help right away if you are not doing well or get worse. Document Released: 12/05/2006 Document Revised: 10/17/2011 Document Reviewed: 02/05/2007 [facility] Patient Information 2014 [place], [place].   ________________________________________________________________________

## 2021-06-17 ENCOUNTER — Encounter (HOSPITAL_COMMUNITY)
Admission: RE | Admit: 2021-06-17 | Discharge: 2021-06-17 | Disposition: A | Discharge status: Home / Self Care | Payer: Medicare Other | Source: Ambulatory Visit | Attending: Orthopaedic Surgery | Admitting: Orthopaedic Surgery

## 2021-06-17 ENCOUNTER — Encounter (HOSPITAL_COMMUNITY): Payer: Self-pay

## 2021-06-17 ENCOUNTER — Other Ambulatory Visit: Payer: Self-pay

## 2021-06-17 ENCOUNTER — Ambulatory Visit
Admission: RE | Admit: 2021-06-17 | Discharge: 2021-06-17 | Disposition: A | Discharge status: Home / Self Care | Payer: Medicare Other | Source: Ambulatory Visit | Attending: Acute Care | Admitting: Acute Care

## 2021-06-17 VITALS — BP 124/72 | HR 71 | Temp 97.8°F | Resp 18 | Ht 66.0 in | Wt 146.2 lb

## 2021-06-17 DIAGNOSIS — F17210 Nicotine dependence, cigarettes, uncomplicated: Secondary | ICD-10-CM

## 2021-06-17 DIAGNOSIS — Z87891 Personal history of nicotine dependence: Secondary | ICD-10-CM

## 2021-06-17 DIAGNOSIS — I10 Essential (primary) hypertension: Secondary | ICD-10-CM | POA: Diagnosis not present

## 2021-06-17 DIAGNOSIS — M87052 Idiopathic aseptic necrosis of left femur: Secondary | ICD-10-CM

## 2021-06-17 DIAGNOSIS — Z01818 Encounter for other preprocedural examination: Secondary | ICD-10-CM

## 2021-06-17 DIAGNOSIS — Z01812 Encounter for preprocedural laboratory examination: Secondary | ICD-10-CM | POA: Diagnosis present

## 2021-06-17 DIAGNOSIS — M87852 Other osteonecrosis, left femur: Secondary | ICD-10-CM | POA: Insufficient documentation

## 2021-06-17 HISTORY — DX: Malignant (primary) neoplasm, unspecified: C80.1

## 2021-06-17 LAB — SURGICAL PCR SCREEN
MRSA, PCR: NEGATIVE
Staphylococcus aureus: NEGATIVE

## 2021-06-17 LAB — BASIC METABOLIC PANEL
Anion gap: 8 (ref 5–15)
BUN: 21 mg/dL (ref 8–23)
CO2: 28 mmol/L (ref 22–32)
Calcium: 9.1 mg/dL (ref 8.9–10.3)
Chloride: 106 mmol/L (ref 98–111)
Creatinine, Ser: 0.83 mg/dL (ref 0.44–1.00)
GFR, Estimated: >60 mL/min (ref >60)
Glucose, Bld: 96 mg/dL (ref 70–99)
Potassium: 3.9 mmol/L (ref 3.5–5.1)
Sodium: 142 mmol/L (ref 135–145)

## 2021-06-17 LAB — CBC
HCT: 36.7 % (ref 36.0–46.0)
Hemoglobin: 11.1 g/dL — ABNORMAL LOW (ref 12.0–15.0)
MCH: 25.6 pg — ABNORMAL LOW (ref 26.0–34.0)
MCHC: 30.2 g/dL (ref 30.0–36.0)
MCV: 84.6 fL (ref 80.0–100.0)
Platelets: 262 10*3/uL (ref 150–400)
RBC: 4.34 MIL/uL (ref 3.87–5.11)
RDW: 14.6 % (ref 11.5–15.5)
WBC: 6.5 10*3/uL (ref 4.0–10.5)
nRBC: 0 % (ref 0.0–0.2)

## 2021-06-17 NOTE — Progress Notes (Signed)
COVID Vaccine Completed: Yes Date COVID Vaccine completed:11/24/20 x 4 COVID vaccine manufacturer: [employer] Test: 06/23/21  PCP - Dr. Diamantina Monks. Cardiologist -   Chest x-ray -  EKG - 03/11/21 Stress Test -  ECHO -  Cardiac Cath -  Pacemaker/ICD device last checked:  Sleep Study -  CPAP -   Fasting Blood Sugar -  Checks Blood Sugar _____ times a day  Blood Thinner Instructions: Aspirin Instructions: Last Dose:  Anesthesia review: Hx: HTN  Patient denies shortness of breath, fever, cough and chest pain at PAT appointment   Patient verbalized understanding of instructions that were given to them at the PAT appointment. Patient was also instructed that they will need to review over the PAT instructions again at home before surgery.

## 2021-06-23 ENCOUNTER — Other Ambulatory Visit: Payer: Self-pay | Admitting: Orthopaedic Surgery

## 2021-06-23 LAB — SARS CORONAVIRUS 2 (TAT 6-24 HRS): SARS Coronavirus 2: NEGATIVE

## 2021-06-24 DIAGNOSIS — M87052 Idiopathic aseptic necrosis of left femur: Secondary | ICD-10-CM

## 2021-06-24 NOTE — H&P (Signed)
TOTAL HIP ADMISSION H&P  Patient is admitted for left total hip arthroplasty.  Subjective:  Chief Complaint: left hip pain  HPI: Katie Gaines, 65 y.o. female, has a history of pain and functional disability in the  left  hip(s) due to  avascular necrosis  and patient has failed non-surgical conservative treatments for greater than 12 weeks to include use of assistive devices and activity modification.  Onset of symptoms was abrupt starting just a few months ago with rapidlly worsening course since that time.The patient noted no past surgery on the left hip(s).  Patient currently rates pain in the left hip at 10 out of 10 with activity. Patient has night pain, worsening of pain with activity and weight bearing, pain that interfers with activities of daily living, and pain with passive range of motion. Patient has evidence of subchondral cysts and subchondral collapse  by imaging studies. This condition presents safety issues increasing the risk of falls. This patient has had avascular necrosis of the hip, acetabular fracture, hip dysplasia.  There is no current active infection.  Patient Active Problem List   Diagnosis Date Noted   Avascular necrosis of hip, left ([place]) 06/24/2021   Fusion of spine of lumbar region 03/17/2021   Common peroneal neuropathy, left 06/02/2020   Dysesthesia 03/16/2020   Numbness 03/16/2020   Visual changes 03/16/2020   History of fusion of cervical spine 03/16/2020   History of fusion of lumbar spine 03/16/2020   Fatigue 11/29/2019   DDD (degenerative disc disease), lumbar 08/26/2019   Gastroesophageal reflux disease 06/17/2019   Vitamin D deficiency 06/17/2019   Irritable bowel syndrome with diarrhea 03/06/2019   Closed fracture of distal end of radius 10/04/2018   Injury of right wrist 09/13/2018   Low back pain 07/15/2016   Pain syndrome, chronic 09/09/2012   Postlaminectomy syndrome, not elsewhere classified 09/09/2012   Anemia 05/30/2012   Essential  hypertension 02/13/2012   Chronic gastrointestinal bleeding 01/04/2012   Hypothyroidism 01/04/2012   Obstruction of bile duct 07/19/2011   Abnormal radiographic examination 07/19/2011   IRON DEFICIENCY 09/10/2009   Past Medical History:  Diagnosis Date   Anemia    Anxiety disorder    Cancer ([place])    Common bile duct stone    Graves disease    History of kidney stones    Hypertension    Hypothyroidism    Iron deficiency    Lymphedema of leg    bilateral    Past Surgical History:  Procedure Laterality Date   ANTERIOR LAT LUMBAR FUSION N/A 08/26/2019   Procedure: ANTERIOR LATERAL LUMBAR INTERBODY FUSION, LUMBAR ONE LUMBAR TWO;  Surgeon: Kary Kos, MD;  Location: [facility];  Service: Neurosurgery;  Laterality: N/A;  anterolateral   APPENDECTOMY     BACK SURGERY  1987-2007   X 10 Surgery   CHOLECYSTECTOMY     ENTEROSCOPY  11/10/2011   Procedure: ENTEROSCOPY;  Surgeon: Inda Castle, MD;  Location: [facility];  Service: Endoscopy;  Laterality: N/A;   ERCP  07/19/2011   Procedure: ENDOSCOPIC RETROGRADE CHOLANGIOPANCREATOGRAPHY (ERCP);  Surgeon: Inda Castle, MD;  Location: [facility];  Service: Endoscopy;  Laterality: N/A;   ESOPHAGOGASTRODUODENOSCOPY  09/2018   FRACTURE SURGERY Left 2018   left arm fracture   HOT HEMOSTASIS  11/10/2011   Procedure: HOT HEMOSTASIS (ARGON PLASMA COAGULATION/BICAP);  Surgeon: Inda Castle, MD;  Location: [facility];  Service: Endoscopy;  Laterality: N/A;    No current facility-administered medications for this encounter.   Current  Outpatient Medications  Medication Sig Dispense Refill Last Dose   amitriptyline (ELAVIL) 25 MG tablet Take 25 mg by mouth at bedtime.       amLODipine (NORVASC) 5 MG tablet Take 5 mg by mouth daily.       aspirin-acetaminophen-caffeine (EXCEDRIN MIGRAINE) 250-250-65 MG tablet Take 1-2 tablets by mouth every 6 (six) hours as needed for headache or migraine.      ergocalciferol (VITAMIN D2) 1.25 MG (50000 UT)  capsule Take 50,000 Units by mouth every Sunday.      fentaNYL (DURAGESIC) 25 MCG/HR Place 1 patch onto the skin every other day.      gabapentin (NEURONTIN) 800 MG tablet Take 800 mg by mouth 4 (four) times daily.       lamoTRIgine (LAMICTAL) 150 MG tablet Take 1 tablet (150 mg total) by mouth 2 (two) times daily. Please call us at [phone] to make follow up visit for further refills. 60 tablet 2    meloxicam (MOBIC) 15 MG tablet Take 1 tablet (15 mg total) by mouth daily. 90 tablet 0    nitroGLYCERIN (NITROSTAT) 0.4 MG SL tablet Place 0.4 mg under the tongue every 5 (five) minutes as needed for chest pain.      rosuvastatin (CRESTOR) 10 MG tablet Take 10 mg by mouth at bedtime.      SYNTHROID 125 MCG tablet Take 125 mcg by mouth daily before breakfast.      No Known Allergies  Social History   Tobacco Use   Smoking status: Former    Packs/day: 0.50    Years: 45.00    Pack years: 22.50    Types: Cigarettes    Quit date: 09/08/2020    Years since quitting: 0.7   Smokeless tobacco: Never  Substance Use Topics   Alcohol use: Yes    Alcohol/week: 0.0 standard drinks    Comment: 1 drink per month     Family History  Problem Relation Age of Onset   Colon polyps Father    Diabetes Father    Brain cancer Mother    Colon cancer Neg Hx    Malignant hyperthermia Neg Hx      Review of Systems  Musculoskeletal:  Positive for back pain and gait problem.  All other systems reviewed and are negative.  Objective:  Physical Exam Vitals reviewed.  Constitutional:      Appearance: Normal appearance.  HENT:     Head: Normocephalic and atraumatic.  Eyes:     Extraocular Movements: Extraocular movements intact.     Pupils: Pupils are equal, round, and reactive to light.  Cardiovascular:     Rate and Rhythm: Normal rate and regular rhythm.     Pulses: Normal pulses.  Pulmonary:     Effort: Pulmonary effort is normal.     Breath sounds: Normal breath sounds.  Abdominal:      Palpations: Abdomen is soft.  Musculoskeletal:     Cervical back: Neck supple.     Left hip: Tenderness and bony tenderness present. Decreased strength.  Neurological:     Mental Status: She is alert and oriented to person, place, and time.  Psychiatric:        Behavior: Behavior normal.    Vital signs in last 24 hours:    Labs:   Estimated body mass index is 23.6 kg/m as calculated from the following:   Height as of 06/17/21: 5\' 6"  (1.676 m).   Weight as of 06/17/21: 66.3 kg.   Imaging Review Plain radiographs  demonstrate severe AVNof the left hip(s). The bone quality appears to be good for age and reported activity level.      Assessment/Plan:  End avascular necrosis, left hip(s)  The patient history, physical examination, clinical judgement of the provider and imaging studies are consistent withAVN of the left hip(s) and total hip arthroplasty is deemed medically necessary. The treatment options including medical management, injection therapy, arthroscopy and arthroplasty were discussed at length. The risks and benefits of total hip arthroplasty were presented and reviewed. The risks due to aseptic loosening, infection, stiffness, dislocation/subluxation,  thromboembolic complications and other imponderables were discussed.  The patient acknowledged the explanation, agreed to proceed with the plan and consent was signed. Patient is being admitted for inpatient treatment for surgery, pain control, PT, OT, prophylactic antibiotics, VTE prophylaxis, progressive ambulation and ADL's and discharge planning.The patient is planning to be discharged home with home health services

## 2021-06-24 NOTE — Anesthesia Preprocedure Evaluation (Addendum)
Anesthesia Evaluation  Patient identified by MRN, date of birth, ID band Patient awake    Reviewed: Allergy & Precautions, NPO status , Patient's Chart, lab work & pertinent test results  Airway Mallampati: II  TM Distance: >3 FB Neck ROM: Full    Dental no notable dental hx. (+) Teeth Intact, Dental Advisory Given   Pulmonary former smoker,    Pulmonary exam normal breath sounds clear to auscultation       Cardiovascular hypertension, Pt. on medications Normal cardiovascular exam Rhythm:Regular Rate:Normal     Neuro/Psych    GI/Hepatic GERD  Medicated and Controlled,  Endo/Other  Hypothyroidism   Renal/GU Lab Results      Component                Value               Date                      CREATININE               0.83                06/17/2021                BUN                      21                  06/17/2021                NA                       142                 06/17/2021                K                        3.9                 06/17/2021                CL                       106                 06/17/2021                CO2                      28                  06/17/2021                Musculoskeletal  (+) Arthritis ,   Abdominal   Peds  Hematology  (+) anemia , Lab Results      Component                Value               Date                      WBC                      6.5  06/17/2021                HGB                      11.1 (L)            06/17/2021                HCT                      36.7                06/17/2021                MCV                      84.6                06/17/2021                PLT                      262                 06/17/2021              Anesthesia Other Findings   Reproductive/Obstetrics                            Anesthesia Physical Anesthesia Plan  ASA: 3  Anesthesia Plan: Spinal   Post-op Pain  Management:    Induction:   PONV Risk Score and Plan: 3 and Treatment may vary due to age or medical condition, Midazolam and Ondansetron  Airway Management Planned: Nasal Cannula and Natural Airway  Additional Equipment: None  Intra-op Plan:   Post-operative Plan: Extubation in OR  Informed Consent: I have reviewed the patients History and Physical, chart, labs and discussed the procedure including the risks, benefits and alternatives for the proposed anesthesia with the patient or authorized representative who has indicated his/her understanding and acceptance.     Dental advisory given  Plan Discussed with: CRNA and Anesthesiologist  Anesthesia Plan Comments:        Anesthesia Quick Evaluation

## 2021-06-25 ENCOUNTER — Other Ambulatory Visit: Payer: Self-pay

## 2021-06-25 ENCOUNTER — Observation Stay (HOSPITAL_COMMUNITY)
Admission: RE | Admit: 2021-06-25 | Discharge: 2021-06-26 | Disposition: A | Discharge status: Home / Self Care | Payer: Medicare Other | Source: Ambulatory Visit | Attending: Orthopaedic Surgery | Admitting: Orthopaedic Surgery

## 2021-06-25 ENCOUNTER — Encounter (HOSPITAL_COMMUNITY): Payer: Self-pay | Admitting: Orthopaedic Surgery

## 2021-06-25 ENCOUNTER — Ambulatory Visit (HOSPITAL_COMMUNITY): Payer: Medicare Other | Admitting: Anesthesiology

## 2021-06-25 ENCOUNTER — Encounter (HOSPITAL_COMMUNITY)
Admission: RE | Disposition: A | Discharge status: Home / Self Care | Payer: Self-pay | Source: Ambulatory Visit | Attending: Orthopaedic Surgery

## 2021-06-25 ENCOUNTER — Observation Stay (HOSPITAL_COMMUNITY): Payer: Medicare Other

## 2021-06-25 ENCOUNTER — Ambulatory Visit (HOSPITAL_COMMUNITY): Payer: Medicare Other

## 2021-06-25 DIAGNOSIS — M1612 Unilateral primary osteoarthritis, left hip: Principal | ICD-10-CM | POA: Insufficient documentation

## 2021-06-25 DIAGNOSIS — Z87891 Personal history of nicotine dependence: Secondary | ICD-10-CM | POA: Diagnosis not present

## 2021-06-25 DIAGNOSIS — I10 Essential (primary) hypertension: Secondary | ICD-10-CM | POA: Insufficient documentation

## 2021-06-25 DIAGNOSIS — M87052 Idiopathic aseptic necrosis of left femur: Secondary | ICD-10-CM | POA: Insufficient documentation

## 2021-06-25 DIAGNOSIS — Z96642 Presence of left artificial hip joint: Secondary | ICD-10-CM

## 2021-06-25 DIAGNOSIS — Z419 Encounter for procedure for purposes other than remedying health state, unspecified: Secondary | ICD-10-CM

## 2021-06-25 DIAGNOSIS — Z79899 Other long term (current) drug therapy: Secondary | ICD-10-CM | POA: Diagnosis not present

## 2021-06-25 DIAGNOSIS — E039 Hypothyroidism, unspecified: Secondary | ICD-10-CM | POA: Diagnosis not present

## 2021-06-25 HISTORY — PX: TOTAL HIP ARTHROPLASTY: SHX124

## 2021-06-25 LAB — TYPE AND SCREEN
ABO/RH(D): O POS
Antibody Screen: NEGATIVE

## 2021-06-25 SURGERY — ARTHROPLASTY, HIP, TOTAL, ANTERIOR APPROACH
Anesthesia: Spinal | Site: Hip | Laterality: Left

## 2021-06-25 MED ORDER — OXYCODONE HCL 5 MG PO TABS
10.0000 mg | ORAL_TABLET | ORAL | Status: DC | PRN
  Administered 2021-06-26: 15 mg via ORAL
  Filled 2021-06-25: qty 3

## 2021-06-25 MED ORDER — ACETAMINOPHEN 325 MG PO TABS: 325.0000 mg | ORAL_TABLET | Freq: Four times a day (QID) | ORAL | Status: DC | PRN

## 2021-06-25 MED ORDER — PANTOPRAZOLE SODIUM 40 MG PO TBEC
40.0000 mg | DELAYED_RELEASE_TABLET | Freq: Every day | ORAL | Status: DC
  Administered 2021-06-25 – 2021-06-26 (×2): 40 mg via ORAL
  Filled 2021-06-25 (×2): qty 1

## 2021-06-25 MED ORDER — FENTANYL CITRATE (PF) 100 MCG/2ML IJ SOLN
INTRAMUSCULAR | Status: DC | PRN
  Administered 2021-06-25: 50 ug via INTRAVENOUS

## 2021-06-25 MED ORDER — OXYCODONE HCL 5 MG PO TABS: 5.0000 mg | ORAL_TABLET | Freq: Once | ORAL | Status: DC | PRN

## 2021-06-25 MED ORDER — METOCLOPRAMIDE HCL 5 MG PO TABS: 5.0000 mg | ORAL_TABLET | Freq: Three times a day (TID) | ORAL | Status: DC | PRN

## 2021-06-25 MED ORDER — FENTANYL CITRATE (PF) 100 MCG/2ML IJ SOLN
INTRAMUSCULAR | Status: AC
  Filled 2021-06-25: qty 2

## 2021-06-25 MED ORDER — FENTANYL 25 MCG/HR TD PT72: 1.0000 | MEDICATED_PATCH | TRANSDERMAL | Status: DC

## 2021-06-25 MED ORDER — ONDANSETRON HCL 4 MG/2ML IJ SOLN
INTRAMUSCULAR | Status: DC | PRN
  Administered 2021-06-25: 4 mg via INTRAVENOUS

## 2021-06-25 MED ORDER — POVIDONE-IODINE 10 % EX SWAB
2.0000 "application " | Freq: Once | CUTANEOUS | Status: AC
  Administered 2021-06-25: 2 via TOPICAL

## 2021-06-25 MED ORDER — PHENYLEPHRINE HCL-NACL 20-0.9 MG/250ML-% IV SOLN
INTRAVENOUS | Status: DC | PRN
  Administered 2021-06-25: 25 ug/min via INTRAVENOUS

## 2021-06-25 MED ORDER — ONDANSETRON HCL 4 MG PO TABS: 4.0000 mg | ORAL_TABLET | Freq: Four times a day (QID) | ORAL | Status: DC | PRN

## 2021-06-25 MED ORDER — STERILE WATER FOR IRRIGATION IR SOLN
Status: DC | PRN
  Administered 2021-06-25: 2000 mL

## 2021-06-25 MED ORDER — AMISULPRIDE (ANTIEMETIC) 5 MG/2ML IV SOLN: 10.0000 mg | Freq: Once | INTRAVENOUS | Status: DC | PRN

## 2021-06-25 MED ORDER — MENTHOL 3 MG MT LOZG: 1.0000 | LOZENGE | OROMUCOSAL | Status: DC | PRN

## 2021-06-25 MED ORDER — FENTANYL CITRATE PF 50 MCG/ML IJ SOSY
PREFILLED_SYRINGE | INTRAMUSCULAR | Status: AC
  Filled 2021-06-25: qty 3

## 2021-06-25 MED ORDER — LAMOTRIGINE 25 MG PO TABS
150.0000 mg | ORAL_TABLET | Freq: Two times a day (BID) | ORAL | Status: DC
  Administered 2021-06-25 – 2021-06-26 (×2): 150 mg via ORAL
  Filled 2021-06-25 (×3): qty 1

## 2021-06-25 MED ORDER — HYDROMORPHONE HCL 1 MG/ML IJ SOLN: 0.5000 mg | INTRAMUSCULAR | Status: DC | PRN

## 2021-06-25 MED ORDER — GABAPENTIN 400 MG PO CAPS
800.0000 mg | ORAL_CAPSULE | Freq: Four times a day (QID) | ORAL | Status: DC
  Administered 2021-06-25 – 2021-06-26 (×3): 800 mg via ORAL
  Filled 2021-06-25 (×3): qty 2

## 2021-06-25 MED ORDER — BISACODYL 10 MG RE SUPP: 10.0000 mg | Freq: Every day | RECTAL | Status: DC | PRN

## 2021-06-25 MED ORDER — PROPOFOL 500 MG/50ML IV EMUL
INTRAVENOUS | Status: AC
  Filled 2021-06-25: qty 50

## 2021-06-25 MED ORDER — ORAL CARE MOUTH RINSE: 15.0000 mL | Freq: Once | OROMUCOSAL | Status: AC

## 2021-06-25 MED ORDER — NITROGLYCERIN 0.4 MG SL SUBL: 0.4000 mg | SUBLINGUAL_TABLET | SUBLINGUAL | Status: DC | PRN

## 2021-06-25 MED ORDER — OXYCODONE HCL 5 MG PO TABS
5.0000 mg | ORAL_TABLET | ORAL | Status: DC | PRN
  Administered 2021-06-25 – 2021-06-26 (×4): 10 mg via ORAL
  Filled 2021-06-25 (×4): qty 2

## 2021-06-25 MED ORDER — TRANEXAMIC ACID-NACL 1000-0.7 MG/100ML-% IV SOLN
1000.0000 mg | INTRAVENOUS | Status: AC
  Administered 2021-06-25: 1000 mg via INTRAVENOUS
  Filled 2021-06-25: qty 100

## 2021-06-25 MED ORDER — AMITRIPTYLINE HCL 25 MG PO TABS
25.0000 mg | ORAL_TABLET | Freq: Every day | ORAL | Status: DC
  Administered 2021-06-25: 25 mg via ORAL
  Filled 2021-06-25 (×2): qty 1

## 2021-06-25 MED ORDER — LEVOTHYROXINE SODIUM 125 MCG PO TABS
125.0000 ug | ORAL_TABLET | Freq: Every day | ORAL | Status: DC
  Administered 2021-06-26: 125 ug via ORAL
  Filled 2021-06-25: qty 1

## 2021-06-25 MED ORDER — HYDROMORPHONE HCL 1 MG/ML IJ SOLN
INTRAMUSCULAR | Status: AC
  Filled 2021-06-25: qty 2

## 2021-06-25 MED ORDER — OXYCODONE HCL 5 MG/5ML PO SOLN: 5.0000 mg | Freq: Once | ORAL | Status: DC | PRN

## 2021-06-25 MED ORDER — POLYETHYLENE GLYCOL 3350 17 G PO PACK: 17.0000 g | PACK | Freq: Every day | ORAL | Status: DC | PRN

## 2021-06-25 MED ORDER — PHENYLEPHRINE HCL (PRESSORS) 10 MG/ML IV SOLN
INTRAVENOUS | Status: AC
  Filled 2021-06-25: qty 2

## 2021-06-25 MED ORDER — ACETAMINOPHEN 10 MG/ML IV SOLN
INTRAVENOUS | Status: AC
  Filled 2021-06-25: qty 100

## 2021-06-25 MED ORDER — DEXAMETHASONE SODIUM PHOSPHATE 10 MG/ML IJ SOLN
INTRAMUSCULAR | Status: AC
  Filled 2021-06-25: qty 1

## 2021-06-25 MED ORDER — PHENOL 1.4 % MT LIQD: 1.0000 | OROMUCOSAL | Status: DC | PRN

## 2021-06-25 MED ORDER — DIPHENHYDRAMINE HCL 12.5 MG/5ML PO ELIX: 12.5000 mg | ORAL_SOLUTION | ORAL | Status: DC | PRN

## 2021-06-25 MED ORDER — ROSUVASTATIN CALCIUM 10 MG PO TABS
10.0000 mg | ORAL_TABLET | Freq: Every day | ORAL | Status: DC
  Administered 2021-06-25: 10 mg via ORAL
  Filled 2021-06-25: qty 1

## 2021-06-25 MED ORDER — PROPOFOL 500 MG/50ML IV EMUL
INTRAVENOUS | Status: DC | PRN
  Administered 2021-06-25: 100 ug/kg/min via INTRAVENOUS

## 2021-06-25 MED ORDER — DOCUSATE SODIUM 100 MG PO CAPS
100.0000 mg | ORAL_CAPSULE | Freq: Two times a day (BID) | ORAL | Status: DC
  Administered 2021-06-25 – 2021-06-26 (×2): 100 mg via ORAL
  Filled 2021-06-25 (×2): qty 1

## 2021-06-25 MED ORDER — LACTATED RINGERS IV SOLN: INTRAVENOUS | Status: DC

## 2021-06-25 MED ORDER — SODIUM CHLORIDE 0.9 % IR SOLN
Status: DC | PRN
  Administered 2021-06-25: 1000 mL

## 2021-06-25 MED ORDER — DEXAMETHASONE SODIUM PHOSPHATE 10 MG/ML IJ SOLN
INTRAMUSCULAR | Status: DC | PRN
  Administered 2021-06-25: 8 mg via INTRAVENOUS

## 2021-06-25 MED ORDER — PROPOFOL 10 MG/ML IV BOLUS
INTRAVENOUS | Status: DC | PRN
  Administered 2021-06-25: 20 mg via INTRAVENOUS

## 2021-06-25 MED ORDER — HYDROMORPHONE HCL 1 MG/ML IJ SOLN: 0.2500 mg | INTRAMUSCULAR | Status: DC | PRN

## 2021-06-25 MED ORDER — BUPIVACAINE IN DEXTROSE 0.75-8.25 % IT SOLN
INTRATHECAL | Status: DC | PRN
  Administered 2021-06-25: 11.25 mg via INTRATHECAL

## 2021-06-25 MED ORDER — ACETAMINOPHEN 10 MG/ML IV SOLN: 1000.0000 mg | Freq: Once | INTRAVENOUS | Status: DC | PRN

## 2021-06-25 MED ORDER — MIDAZOLAM HCL 5 MG/5ML IJ SOLN
INTRAMUSCULAR | Status: DC | PRN
  Administered 2021-06-25: 2 mg via INTRAVENOUS

## 2021-06-25 MED ORDER — METOCLOPRAMIDE HCL 5 MG/ML IJ SOLN: 5.0000 mg | Freq: Three times a day (TID) | INTRAMUSCULAR | Status: DC | PRN

## 2021-06-25 MED ORDER — SODIUM CHLORIDE 0.9 % IV SOLN: INTRAVENOUS | Status: DC

## 2021-06-25 MED ORDER — ALUM & MAG HYDROXIDE-SIMETH 200-200-20 MG/5ML PO SUSP: 30.0000 mL | ORAL | Status: DC | PRN

## 2021-06-25 MED ORDER — CHLORHEXIDINE GLUCONATE 0.12 % MT SOLN
15.0000 mL | Freq: Once | OROMUCOSAL | Status: AC
  Administered 2021-06-25: 15 mL via OROMUCOSAL

## 2021-06-25 MED ORDER — PROPOFOL 10 MG/ML IV BOLUS
INTRAVENOUS | Status: AC
  Filled 2021-06-25: qty 20

## 2021-06-25 MED ORDER — AMLODIPINE BESYLATE 5 MG PO TABS
5.0000 mg | ORAL_TABLET | Freq: Every day | ORAL | Status: DC
  Administered 2021-06-26: 5 mg via ORAL
  Filled 2021-06-25: qty 1

## 2021-06-25 MED ORDER — ASPIRIN 81 MG PO CHEW
81.0000 mg | CHEWABLE_TABLET | Freq: Two times a day (BID) | ORAL | Status: DC
  Administered 2021-06-25 – 2021-06-26 (×2): 81 mg via ORAL
  Filled 2021-06-25 (×2): qty 1

## 2021-06-25 MED ORDER — ONDANSETRON HCL 4 MG/2ML IJ SOLN
INTRAMUSCULAR | Status: AC
  Filled 2021-06-25: qty 2

## 2021-06-25 MED ORDER — ONDANSETRON HCL 4 MG/2ML IJ SOLN: 4.0000 mg | Freq: Four times a day (QID) | INTRAMUSCULAR | Status: DC | PRN

## 2021-06-25 MED ORDER — CEFAZOLIN SODIUM-DEXTROSE 2-4 GM/100ML-% IV SOLN
2.0000 g | INTRAVENOUS | Status: AC
  Administered 2021-06-25: 2 g via INTRAVENOUS
  Filled 2021-06-25: qty 100

## 2021-06-25 MED ORDER — 0.9 % SODIUM CHLORIDE (POUR BTL) OPTIME
TOPICAL | Status: DC | PRN
  Administered 2021-06-25: 1000 mL

## 2021-06-25 MED ORDER — ONDANSETRON HCL 4 MG/2ML IJ SOLN: 4.0000 mg | Freq: Once | INTRAMUSCULAR | Status: DC | PRN

## 2021-06-25 MED ORDER — CEFAZOLIN SODIUM-DEXTROSE 1-4 GM/50ML-% IV SOLN
1.0000 g | Freq: Four times a day (QID) | INTRAVENOUS | Status: AC
  Administered 2021-06-25 (×2): 1 g via INTRAVENOUS
  Filled 2021-06-25 (×2): qty 50

## 2021-06-25 MED ORDER — MIDAZOLAM HCL 2 MG/2ML IJ SOLN
INTRAMUSCULAR | Status: AC
  Filled 2021-06-25: qty 2

## 2021-06-25 SURGICAL SUPPLY — 42 items
APL SKNCLS STERI-STRIP NONHPOA (GAUZE/BANDAGES/DRESSINGS)
BAG COUNTER SPONGE SURGICOUNT (BAG) ×2 IMPLANT
BAG SPEC THK2 15X12 ZIP CLS (MISCELLANEOUS)
BAG SPNG CNTER NS LX DISP (BAG) ×1
BAG ZIPLOCK 12X15 (MISCELLANEOUS) IMPLANT
BENZOIN TINCTURE PRP APPL 2/3 (GAUZE/BANDAGES/DRESSINGS) IMPLANT
BLADE SAW SGTL 18X1.27X75 (BLADE) ×2 IMPLANT
COVER PERINEAL POST (MISCELLANEOUS) ×2 IMPLANT
COVER SURGICAL LIGHT HANDLE (MISCELLANEOUS) ×2 IMPLANT
CUP SECTOR GRIPTON 50MM (Cup) ×1 IMPLANT
DRAPE FOOT SWITCH (DRAPES) ×2 IMPLANT
DRAPE STERI IOBAN 125X83 (DRAPES) ×2 IMPLANT
DRAPE U-SHAPE 47X51 STRL (DRAPES) ×4 IMPLANT
DRSG AQUACEL AG ADV 3.5X10 (GAUZE/BANDAGES/DRESSINGS) ×2 IMPLANT
DURAPREP 26ML APPLICATOR (WOUND CARE) ×2 IMPLANT
ELECT REM PT RETURN 15FT ADLT (MISCELLANEOUS) ×2 IMPLANT
FEM STEM 12/14 TAPER SZ 4 HIP (Orthopedic Implant) ×2 IMPLANT
FEMORAL STEM 12/14 TPR SZ4 HIP (Orthopedic Implant) IMPLANT
GAUZE XEROFORM 1X8 LF (GAUZE/BANDAGES/DRESSINGS) ×2 IMPLANT
GLOVE SRG 8 PF TXTR STRL LF DI (GLOVE) ×2 IMPLANT
GLOVE SURG ENC MOIS LTX SZ7.5 (GLOVE) ×2 IMPLANT
GLOVE SURG NEOPR MICRO LF SZ8 (GLOVE) ×2 IMPLANT
GLOVE SURG UNDER POLY LF SZ8 (GLOVE) ×4
GOWN STRL REUS W/TWL XL LVL3 (GOWN DISPOSABLE) ×4 IMPLANT
HANDPIECE INTERPULSE COAX TIP (DISPOSABLE) ×2
HEAD FEM STD 32X+1 STRL (Hips) ×1 IMPLANT
HOLDER FOLEY CATH W/STRAP (MISCELLANEOUS) ×2 IMPLANT
KIT TURNOVER KIT A (KITS) IMPLANT
LINER ACETABULAR 32X50 (Liner) ×1 IMPLANT
PACK ANTERIOR HIP CUSTOM (KITS) ×2 IMPLANT
PENCIL SMOKE EVACUATOR (MISCELLANEOUS) IMPLANT
SET HNDPC FAN SPRY TIP SCT (DISPOSABLE) ×1 IMPLANT
STAPLER VISISTAT 35W (STAPLE) IMPLANT
STRIP CLOSURE SKIN 1/2X4 (GAUZE/BANDAGES/DRESSINGS) IMPLANT
SUT ETHIBOND NAB CT1 #1 30IN (SUTURE) ×2 IMPLANT
SUT ETHILON 2 0 PS N (SUTURE) IMPLANT
SUT MNCRL AB 4-0 PS2 18 (SUTURE) IMPLANT
SUT VIC AB 0 CT1 36 (SUTURE) ×2 IMPLANT
SUT VIC AB 1 CT1 36 (SUTURE) ×2 IMPLANT
SUT VIC AB 2-0 CT1 27 (SUTURE) ×4
SUT VIC AB 2-0 CT1 TAPERPNT 27 (SUTURE) ×2 IMPLANT
TRAY FOLEY MTR SLVR 16FR STAT (SET/KITS/TRAYS/PACK) IMPLANT

## 2021-06-25 NOTE — Brief Op Note (Signed)
06/25/2021  12:12 PM  PATIENT:  Katie Gaines  65 y.o. female  PRE-OPERATIVE DIAGNOSIS:  avascular necrosis left hip  POST-OPERATIVE DIAGNOSIS:  avascular necrosis left hip  PROCEDURE:  Procedure(s): LEFT TOTAL HIP ARTHROPLASTY ANTERIOR APPROACH (Left)  SURGEON:  Surgeon(s) and Role:    Mcarthur Rossetti, MD - Primary  PHYSICIAN ASSISTANT: Benita Stabile, PA-C  ANESTHESIA:   spinal  EBL:  100 mL   COUNTS:  YES  DICTATION: .Other Dictation: Dictation Number [id number]  PLAN OF CARE: Admit for overnight observation  PATIENT DISPOSITION:  PACU - hemodynamically stable.   Delay start of Pharmacological VTE agent (>24hrs) due to surgical blood loss or risk of bleeding: no

## 2021-06-25 NOTE — Plan of Care (Signed)
  Problem: Education: Goal: Knowledge of General Education information will improve Description: Including pain rating scale, medication(s)/side effects and non-pharmacologic comfort measures Outcome: Progressing   Problem: Activity: Goal: Risk for activity intolerance will decrease Outcome: Progressing   Problem: Nutrition: Goal: Adequate nutrition will be maintained Outcome: Progressing   Problem: Elimination: Goal: Will not experience complications related to bowel motility Outcome: Progressing   Problem: Safety: Goal: Ability to remain free from injury will improve Outcome: Progressing   Problem: Education: Goal: Knowledge of the prescribed therapeutic regimen will improve Outcome: Progressing   Problem: Activity: Goal: Ability to avoid complications of mobility impairment will improve Outcome: Progressing   Problem: Pain Management: Goal: Pain level will decrease with appropriate interventions Outcome: Progressing

## 2021-06-25 NOTE — Plan of Care (Signed)
  Problem: Education: Goal: Knowledge of General Education information will improve Description: Including pain rating scale, medication(s)/side effects and non-pharmacologic comfort measures Outcome: Progressing   Problem: Coping: Goal: Level of anxiety will decrease Outcome: Progressing   Problem: Safety: Goal: Ability to remain free from injury will improve Outcome: Progressing   Problem: Clinical Measurements: Goal: Postoperative complications will be avoided or minimized Outcome: Progressing   Problem: Pain Management: Goal: Pain level will decrease with appropriate interventions Outcome: Progressing

## 2021-06-25 NOTE — Interval H&P Note (Signed)
History and Physical Interval Note: The patient understands that she is here today for hip replacement to treat her left hip avascular process.  There has been no acute or interval change in her medical status.  Please see recent H&P.  The risks and benefits of surgery been discussed in detail and informed consent is obtained.  The left hip has been marked.  06/25/2021 9:58 AM  Katie Gaines  has presented today for surgery, with the diagnosis of avascular necrosis left hip.  The various methods of treatment have been discussed with the patient and family. After consideration of risks, benefits and other options for treatment, the patient has consented to  Procedure(s): LEFT TOTAL HIP ARTHROPLASTY ANTERIOR APPROACH (Left) as a surgical intervention.  The patient's history has been reviewed, patient examined, no change in status, stable for surgery.  I have reviewed the patient's chart and labs.  Questions were answered to the patient's satisfaction.     Mcarthur Rossetti

## 2021-06-25 NOTE — Anesthesia Postprocedure Evaluation (Signed)
Anesthesia Post Note  Patient: Katie Gaines  Procedure(s) Performed: LEFT TOTAL HIP ARTHROPLASTY ANTERIOR APPROACH (Left: Hip)     Patient location during evaluation: Nursing Unit Anesthesia Type: Spinal Level of consciousness: oriented and awake and alert Pain management: pain level controlled Vital Signs Assessment: post-procedure vital signs reviewed and stable Respiratory status: spontaneous breathing and respiratory function stable Cardiovascular status: blood pressure returned to baseline and stable Postop Assessment: no headache, no backache, no apparent nausea or vomiting and patient able to bend at knees Anesthetic complications: no   No notable events documented.  Last Vitals:  Vitals:   06/25/21 1410 06/25/21 1625  BP: 115/70 (!) 144/88  Pulse: 63 85  Resp: 16 18  Temp: (!) 36.4 C 36.8 C  SpO2: 97% 96%    Last Pain:  Vitals:   06/25/21 1625  TempSrc: Oral  PainSc:                  Barnet Glasgow

## 2021-06-25 NOTE — Anesthesia Procedure Notes (Signed)
Spinal  Patient location during procedure: OR Start time: 06/25/2021 11:02 AM End time: 06/25/2021 11:06 AM Reason for block: surgical anesthesia Staffing Performed: anesthesiologist  Anesthesiologist: Barnet Glasgow, MD Preanesthetic Checklist Completed: patient identified, IV checked, risks and benefits discussed, surgical consent, monitors and equipment checked, pre-op evaluation and timeout performed Spinal Block Patient position: sitting Prep: DuraPrep and site prepped and draped Patient monitoring: heart rate, cardiac monitor, continuous pulse ox and blood pressure Approach: midline Location: L3-4 Injection technique: single-shot Needle Needle type: Pencan  Needle gauge: 24 G Needle length: 10 cm Needle insertion depth: 6 cm Assessment Sensory level: T6 Events: CSF return Additional Notes  1 Attempt (s). Pt tolerated procedure well.

## 2021-06-25 NOTE — Anesthesia Procedure Notes (Signed)
Date/Time: 06/25/2021 11:00 AM Performed by: Sharlette Dense, CRNA Oxygen Delivery Method: Simple face mask

## 2021-06-25 NOTE — Evaluation (Signed)
Physical Therapy Evaluation Patient Details Name: Katie Gaines MRN: [id number] DOB: 09-Mar-1956 Today's Date: 06/25/2021  History of Present Illness  Patient is 65 y.o. female s/p Lt THA anterior approach on 06/25/21 with PMH significant for anemia, OA, anxiety, Graves disease, HTN, hypothyroidism, back surgery, lumbar fusion.    Clinical Impression  Katie Gaines is a 65 y.o. female POD 0 s/p Lt THA. Patient reports independence with mobility at baseline. Patient is now limited by functional impairments (see PT problem list below) and requires min assist for transfers and gait with RW. Patient was able to ambulate ~40 feet with RW and min assist. Patient instructed in exercise to facilitate circulation to manage edema and reduce risk of DVT. Patient will benefit from continued skilled PT interventions to address impairments and progress towards PLOF. Acute PT will follow to progress mobility and stair training in preparation for safe discharge home.        Recommendations for follow up therapy are one component of a multi-disciplinary discharge planning process, led by the attending physician.  Recommendations may be updated based on patient status, additional functional criteria and insurance authorization.  Follow Up Recommendations Follow physician's recommendations for discharge plan and follow up therapies    Assistance Recommended at Discharge Frequent or constant Supervision/Assistance  Functional Status Assessment Patient has had a recent decline in their functional status and demonstrates the ability to make significant improvements in function in a reasonable and predictable amount of time.  Equipment Recommendations  Rolling walker (2 wheels)    Recommendations for Other Services       Precautions / Restrictions Precautions Precautions: Fall Restrictions Weight Bearing Restrictions: No Other Position/Activity Restrictions: WBAT      Mobility  Bed Mobility Overal  bed mobility: Needs Assistance Bed Mobility: Supine to Sit     Supine to sit: Min guard     General bed mobility comments: cues for use of bed rail to sit up.    Transfers Overall transfer level: Needs assistance Equipment used: Rolling walker (2 wheels) Transfers: Sit to/from Stand Sit to Stand: Min assist;From elevated surface           General transfer comment: cues for safe technique with RW, cues needed to widen BOS prior to stand. Min assist to steady on rise.    Ambulation/Gait Ambulation/Gait assistance: Min assist Gait Distance (Feet): 40 Feet Assistive device: Rolling walker (2 wheels) Gait Pattern/deviations: Step-to pattern;Decreased stride length Gait velocity: decr     General Gait Details: cues for safe step to pattern and proximity to RW, pt with heavy reliance on RW for support and assist needed to prevent walker from moving too far forward.  Stairs            Wheelchair Mobility    Modified Rankin (Stroke Patients Only)       Balance Overall balance assessment: Needs assistance Sitting-balance support: Feet supported Sitting balance-Leahy Scale: Good     Standing balance support: Reliant on assistive device for balance;During functional activity;Bilateral upper extremity supported Standing balance-Leahy Scale: Poor                               Pertinent Vitals/Pain      Home Living Family/patient expects to be discharged to:: Private residence Living Arrangements: Spouse/significant other Available Help at Discharge: Family;Available 24 hours/day Type of Home: House Home Access: Stairs to enter Entrance Stairs-Rails: None Entrance Stairs-Number of Steps: 3  Home Layout: One level Home Equipment: Adaptive equipment;Hand held shower head;Grab bars - tub/shower;Shower seat;Cane - single point      Prior Function Prior Level of Function : Independent/Modified Independent                     Hand  Dominance   Dominant Hand: Right    Extremity/Trunk Assessment   Upper Extremity Assessment Upper Extremity Assessment: Overall WFL for tasks assessed    Lower Extremity Assessment Lower Extremity Assessment: LLE deficits/detail LLE: Unable to fully assess due to pain LLE Sensation: WNL LLE Coordination: WNL    Cervical / Trunk Assessment Cervical / Trunk Assessment: Normal  Communication   Communication: No difficulties  Cognition Arousal/Alertness: Awake/alert Behavior During Therapy: WFL for tasks assessed/performed Overall Cognitive Status: Within Functional Limits for tasks assessed                                          General Comments      Exercises Total Joint Exercises Ankle Circles/Pumps: AROM;Both;20 reps;Seated   Assessment/Plan    PT Assessment Patient needs continued PT services  PT Problem List Decreased strength;Decreased range of motion;Decreased balance;Decreased mobility;Decreased activity tolerance;Decreased knowledge of use of DME;Decreased knowledge of precautions;Pain       PT Treatment Interventions DME instruction;Gait training;Therapeutic activities;Balance training;Therapeutic exercise;Functional mobility training;Stair training;Patient/family education    PT Goals (Current goals can be found in the Care Plan section)  Acute Rehab PT Goals Patient Stated Goal: get home meds PT Goal Formulation: With patient Time For Goal Achievement: 07/02/21 Potential to Achieve Goals: Good    Frequency 7X/week   Barriers to discharge        Co-evaluation               AM-PAC PT "6 Clicks" Mobility  Outcome Measure Help needed turning from your back to your side while in a flat bed without using bedrails?: A Little Help needed moving from lying on your back to sitting on the side of a flat bed without using bedrails?: A Little Help needed moving to and from a bed to a chair (including a wheelchair)?: A Little Help  needed standing up from a chair using your arms (e.g., wheelchair or bedside chair)?: A Little Help needed to walk in hospital room?: A Little Help needed climbing 3-5 steps with a railing? : A Little 6 Click Score: 18    End of Session Equipment Utilized During Treatment: Gait belt Activity Tolerance: Patient tolerated treatment well Patient left: in chair;with call bell/phone within reach;with chair alarm set;with family/visitor present Nurse Communication: Mobility status PT Visit Diagnosis: Muscle weakness (generalized) (M62.81);Difficulty in walking, not elsewhere classified (R26.2)    Time: 7517-0017 PT Time Calculation (min) (ACUTE ONLY): 21 min   Charges:   PT Evaluation $PT Eval Low Complexity: 1 Low          Verner Mould, DPT Acute Rehabilitation Services Office [phone] Pager [phone]   Jacques Navy 06/25/2021, 4:40 PM

## 2021-06-25 NOTE — Transfer of Care (Signed)
Immediate Anesthesia Transfer of Care Note  Patient: Katie Gaines  Procedure(s) Performed: LEFT TOTAL HIP ARTHROPLASTY ANTERIOR APPROACH (Left: Hip)  Patient Location: PACU  Anesthesia Type:Spinal  Level of Consciousness: drowsy  Airway & Oxygen Therapy: Patient Spontanous Breathing and Patient connected to face mask oxygen  Post-op Assessment: Report given to RN and Post -op Vital signs reviewed and stable  Post vital signs: Reviewed and stable  Last Vitals:  Vitals Value Taken Time  BP 112/63 06/25/21 1230  Temp    Pulse 58 06/25/21 1230  Resp 8 06/25/21 1230  SpO2 100 % 06/25/21 1230  Vitals shown include unvalidated device data.  Last Pain:  Vitals:   06/25/21 0918  TempSrc:   PainSc: 8       Patients Stated Pain Goal: 3 (63/87/56 4332)  Complications: No notable events documented.

## 2021-06-26 DIAGNOSIS — M1612 Unilateral primary osteoarthritis, left hip: Secondary | ICD-10-CM | POA: Diagnosis not present

## 2021-06-26 LAB — CBC
HCT: 30.5 % — ABNORMAL LOW (ref 36.0–46.0)
Hemoglobin: 9.2 g/dL — ABNORMAL LOW (ref 12.0–15.0)
MCH: 24.9 pg — ABNORMAL LOW (ref 26.0–34.0)
MCHC: 30.2 g/dL (ref 30.0–36.0)
MCV: 82.4 fL (ref 80.0–100.0)
Platelets: 224 10*3/uL (ref 150–400)
RBC: 3.7 MIL/uL — ABNORMAL LOW (ref 3.87–5.11)
RDW: 14.8 % (ref 11.5–15.5)
WBC: 8.2 10*3/uL (ref 4.0–10.5)
nRBC: 0 % (ref 0.0–0.2)

## 2021-06-26 LAB — BASIC METABOLIC PANEL
Anion gap: 7 (ref 5–15)
BUN: 17 mg/dL (ref 8–23)
CO2: 24 mmol/L (ref 22–32)
Calcium: 8.3 mg/dL — ABNORMAL LOW (ref 8.9–10.3)
Chloride: 105 mmol/L (ref 98–111)
Creatinine, Ser: 0.58 mg/dL (ref 0.44–1.00)
GFR, Estimated: >60 mL/min (ref >60)
Glucose, Bld: 159 mg/dL — ABNORMAL HIGH (ref 70–99)
Potassium: 3.6 mmol/L (ref 3.5–5.1)
Sodium: 136 mmol/L (ref 135–145)

## 2021-06-26 MED ORDER — ASPIRIN 81 MG PO CHEW: 81.0000 mg | CHEWABLE_TABLET | Freq: Two times a day (BID) | ORAL | 0 refills | Status: DC

## 2021-06-26 MED ORDER — OXYCODONE HCL 5 MG PO TABS: 5.0000 mg | ORAL_TABLET | Freq: Four times a day (QID) | ORAL | 0 refills | Status: DC | PRN

## 2021-06-26 NOTE — Op Note (Signed)
NAME: CURRY, DULSKI MEDICAL RECORD NO: [id number] ACCOUNT NO: [id number] DATE OF BIRTH: 06-24-1956 FACILITY: [facility]: [facility] PHYSICIAN: Lind Guest. Ninfa Linden, MD  Operative Report   DATE OF PROCEDURE: 06/25/2021  PREOPERATIVE DIAGNOSES:  Left hip avascular necrosis and osteoarthritis.  POSTOPERATIVE DIAGNOSES:  Left hip avascular necrosis and osteoarthritis.  PROCEDURE:  Left total hip arthroplasty through direct anterior approach.  IMPLANTS:  DePuy Sector Gription acetabular component size 50, size 32+0 neutral polyethylene liner, size 4 ACTIS femoral component with high offset, size 32+1 metal hip ball.  SURGEON:  Lind Guest. Ninfa Linden, MD  ASSISTANT: Erskine Emery, PA-C.  ANESTHESIA:  Spinal.  ANTIBIOTICS:  2 g IV Ancef.  ESTIMATED BLOOD LOSS:  237 mL  COMPLICATIONS:  None.  INDICATIONS:  The patient is a 65 year old female with debilitating pain involving her left hip.  She was sent to me by neurosurgery after MRI showed avascular necrosis with some femoral head collapse.  There was also superimposed osteoarthritis.  She  has been ambulating with a cane and we put her on the schedule for moving on for total hip arthroplasty given the amount of pain she is having and the MRI findings and x-ray findings.  We had a long and thorough discussion about hip replacement surgery.   We talked about the goals being decreased pain, improve mobility and overall improve quality of life.  She understands the risk of acute blood loss anemia, nerve or vessel injury, fracture, infection, dislocation, DVT, implant failure, skin and soft  tissue issues and leg length differences.  DESCRIPTION OF PROCEDURE:  After informed consent was obtained, appropriate left hip was marked.  She was brought to the operating room and sat up on the stretcher.  Spinal anesthesia was obtained.  She was laid in supine position on the stretcher.  A  Foley catheter was placed and traction boots were  placed on both her feet.  Next, she was placed supine on the Hana fracture table, the perineal post in place and both legs in line skeletal traction device and no traction applied.  Her left operative hip  was assessed radiographically.  A timeout was called and she was marked as correct patient, correct left hip.  We then made an incision just inferior and posterior to the anterior superior iliac spine and carried this obliquely down the leg.  We  dissected down tensor fascia lata muscle.  Tensor fascia was then divided longitudinally to proceed with direct anterior approach to the hip.  We identified and cauterized circumflex vessels and identified the hip capsule, opened the hip capsule in  L-type format, finding a very large joint effusion.  We then made a femoral neck cut with oscillating saw just proximal to the lesser trochanter and completed this with an osteotome.  We placed a corkscrew guide in the femoral head and removed the  femoral head in its entirety and found a wide area devoid of cartilage as well as some flattening and fibrinous cartilage consistent with avascular necrosis and osteoarthritis.  We then removed remnants of acetabular labrum and other debris from the  acetabulum.  I placed a bent Hohmann over the medial acetabular rim and then began reaming under direct visualization from a size 43 reamer going up to a size 49 in a stepwise increments.  All reamers were placed under direct visualization and the last  reamer was also placed under direct fluoroscopy, so I could obtain my depth of reaming by inclination and anteversion.  I then placed real DePuy  Sector Gription acetabular component size 50 and a 32+0 neutral polyethylene liner for that size 50  acetabular component.  Attention was then turned to the femur.  With the leg externally rotated to 120 degrees, extended and adducted, we were able to place a Mueller retractor medially and Hohman retractor behind the greater trochanter,  we released  lateral joint capsule and used a box cutting osteotome to enter femoral canal and a rongeur to lateralize. Then, began broaching using the ACTIS broaching system from DePuy from a size 0 going up to a size 4.  With the size 4 in place, we trialed a  standard offset femoral neck and a 32+1 hip ball, reduced this in the pelvis and right away, we knew we needed more offset.  Her range of motion was good and her leg lengths were equal, but we definitely more offset.  We dislocated the hip, removed the  trial components.  We placed the real ACTIS femoral component with size 4 with high offset and we went with a real 32+1 metal hip ball, reduced this in acetabulum and then we were very pleased with leg length, offset, range of motion and stability  assessed mechanically and radiographically.  We then irrigated the soft tissue with normal saline solution using pulsatile lavage.  We closed the joint capsule with interrupted #1 Ethibond suture followed by #1 Vicryl to close the tensor fascia.  0  Vicryl was used to close the deep tissue and 2-0 Vicryl was used to close subcutaneous tissue.  The skin was reapproximated with staples.  An Aquacel dressing was applied.  She was taken off the Hana table and taken to recovery room in stable condition  with all final counts being correct and no complications noted.    Of note, Benita Stabile, PA-C, did assist during the entire case.  His assistance was crucial for facilitating every aspect of this case.      PAA D: 06/25/2021 12:10:22 pm T: 06/26/2021 1:45:00 am  JOB: [id number]/ [id number]

## 2021-06-26 NOTE — TOC Transition Note (Addendum)
Transition of Care [facility]) - CM/SW Discharge Note   Patient Details  Name: Katie Gaines MRN: [id number] Date of Birth: 1956/04/03  Transition of Care [facility]) CM/SW Contact:  Ross Ludwig, LCSW Phone Number: 06/26/2021, 12:39 PM   Clinical Narrative:     CSW was informed that patient needs a rolling walker.  CSW spoke to patient she does not have  a rolling walker, and she would like one.  CSW spoke to [facility] and they will deliver one to patient before she leaves.  Per patient, she does not want any home health and she has already spoken to physician.   Final next level of care: Home/Self Care Barriers to Discharge: Barriers Resolved   Patient Goals and CMS Choice Patient states their goals for this hospitalization and ongoing recovery are:: To return back home. CMS Medicare.gov Compare Post Acute Care list provided to:: Patient Choice offered to / list presented to : Patient  Discharge Placement  Patient discharging back home.                     Discharge Plan and Services                DME Arranged: Walker rolling DME Agency: [employer] Date DME Agency Contacted: 06/26/21 Time DME Agency Contacted: [phone] Representative spoke with at [place]: [place] ([place]) Interventions     Readmission Risk Interventions No flowsheet data found.

## 2021-06-26 NOTE — Discharge Summary (Signed)
Patient ID: Katie Gaines MRN: [id number] DOB/AGE: August 27, 1955 65 y.o.  Admit date: 06/25/2021 Discharge date: 06/26/2021  Admission Diagnoses:  Principal Problem:   Avascular necrosis of hip, left ([place]) Active Problems:   Status post left hip replacement   Discharge Diagnoses:  Same  Past Medical History:  Diagnosis Date   Anemia    Anxiety disorder    Cancer ([place])    Common bile duct stone    Graves disease    History of kidney stones    Hypertension    Hypothyroidism    Iron deficiency    Lymphedema of leg    bilateral    Surgeries: Procedure(s): LEFT TOTAL HIP ARTHROPLASTY ANTERIOR APPROACH on 06/25/2021   Consultants:   Discharged Condition: Improved  Hospital Course: Katie Gaines is an 65 y.o. female who was admitted 06/25/2021 for operative treatment ofAvascular necrosis of hip, left ([place]). Patient has severe unremitting pain that affects sleep, daily activities, and work/hobbies. After pre-op clearance the patient was taken to the operating room on 06/25/2021 and underwent  Procedure(s): LEFT TOTAL HIP ARTHROPLASTY ANTERIOR APPROACH.    Patient was given perioperative antibiotics:  Anti-infectives (From admission, onward)    Start     Dose/Rate Route Frequency Ordered Stop   06/25/21 1800  ceFAZolin (ANCEF) IVPB 1 g/50 mL premix        1 g 100 mL/hr over 30 Minutes Intravenous Every 6 hours 06/25/21 1411 06/26/21 0014   06/25/21 0900  ceFAZolin (ANCEF) IVPB 2g/100 mL premix        2 g 200 mL/hr over 30 Minutes Intravenous On call to O.R. 06/25/21 8182 06/25/21 1115        Patient was given sequential compression devices, early ambulation, and chemoprophylaxis to prevent DVT.  Patient benefited maximally from hospital stay and there were no complications.    Recent vital signs: Patient Vitals for the past 24 hrs:  BP Temp Temp src Pulse Resp SpO2 Height Weight  06/26/21 1029 127/83 99.1 F (37.3 C) Oral 92 18 97 % -- --  06/26/21 0518  125/69 98.5 F (36.9 C) Oral 85 18 94 % -- --  06/26/21 0144 113/71 98.6 F (37 C) -- 93 18 95 % -- --  06/25/21 2135 130/81 97.9 F (36.6 C) Oral 85 18 97 % -- --  06/25/21 1811 (!) 146/90 -- -- 94 18 98 % -- --  06/25/21 1625 (!) 144/88 98.2 F (36.8 C) Oral 85 18 96 % -- --  06/25/21 1410 115/70 (!) 97.5 F (36.4 C) Oral 63 16 97 % 5\' 6"  (1.676 m) 66.3 kg  06/25/21 1340 -- -- -- (!) 59 10 99 % -- --  06/25/21 1330 (!) 94/59 -- -- 60 10 99 % -- --  06/25/21 1320 -- -- -- (!) 58 10 98 % -- --  06/25/21 1310 -- -- -- (!) 59 10 98 % -- --  06/25/21 1302 117/67 97.8 F (36.6 C) -- (!) 58 10 97 % -- --  06/25/21 1300 117/67 -- -- 61 16 98 % -- --  06/25/21 1250 -- -- -- 66 15 97 % -- --  06/25/21 1245 123/78 -- -- 62 11 100 % -- --  06/25/21 1240 -- -- -- 65 (!) 8 100 % -- --  06/25/21 1235 -- -- -- 60 (!) 9 100 % -- --  06/25/21 1230 112/63 97.8 F (36.6 C) -- (!) 59 (!) 8 100 % -- --     Recent  laboratory studies:  Recent Labs    06/26/21 0337  WBC 8.2  HGB 9.2*  HCT 30.5*  PLT 224  NA 136  K 3.6  CL 105  CO2 24  BUN 17  CREATININE 0.58  GLUCOSE 159*  CALCIUM 8.3*     Discharge Medications:   Allergies as of 06/26/2021   No Known Allergies      Medication List     TAKE these medications    amitriptyline 25 MG tablet Commonly known as: ELAVIL Take 25 mg by mouth at bedtime.   amLODipine 5 MG tablet Commonly known as: NORVASC Take 5 mg by mouth daily.   aspirin 81 MG chewable tablet Chew 1 tablet (81 mg total) by mouth 2 (two) times daily.   aspirin-acetaminophen-caffeine 536-468-03 MG tablet Commonly known as: EXCEDRIN MIGRAINE Take 1-2 tablets by mouth every 6 (six) hours as needed for headache or migraine.   ergocalciferol 1.25 MG (50000 UT) capsule Commonly known as: VITAMIN D2 Take 50,000 Units by mouth every Sunday.   fentaNYL 25 MCG/HR Commonly known as: DURAGESIC Place 1 patch onto the skin every other day.   gabapentin 800 MG  tablet Commonly known as: NEURONTIN Take 800 mg by mouth 4 (four) times daily.   lamoTRIgine 150 MG tablet Commonly known as: LAMICTAL Take 1 tablet (150 mg total) by mouth 2 (two) times daily. Please call us at [phone] to make follow up visit for further refills.   meloxicam 15 MG tablet Commonly known as: MOBIC Take 1 tablet (15 mg total) by mouth daily.   nitroGLYCERIN 0.4 MG SL tablet Commonly known as: NITROSTAT Place 0.4 mg under the tongue every 5 (five) minutes as needed for chest pain.   oxyCODONE 5 MG immediate release tablet Commonly known as: Oxy IR/ROXICODONE Take 1-2 tablets (5-10 mg total) by mouth every 6 (six) hours as needed for moderate pain (pain score 4-6).   rosuvastatin 10 MG tablet Commonly known as: CRESTOR Take 10 mg by mouth at bedtime.   Synthroid 125 MCG tablet Generic drug: levothyroxine Take 125 mcg by mouth daily before breakfast.               Durable Medical Equipment  (From admission, onward)           Start     Ordered   06/25/21 1412  DME 3 n 1  Once        06/25/21 1411   06/25/21 1412  DME Walker rolling  Once       Question Answer Comment  Walker: With 5 Inch Wheels   Patient needs a walker to treat with the following condition Status post total replacement of left hip      11 /18/22 1411            Diagnostic Studies: DG Pelvis Portable  Result Date: 06/25/2021 CLINICAL DATA:  Status post left hip replacement EXAM: PORTABLE PELVIS 1-2 VIEWS COMPARISON:  None. FINDINGS: There is a left hip arthroplasty in normal alignment without evidence of loosening or periprosthetic fracture. Expected soft tissue changes. Partially visualized lumbar spine fusion hardware. IMPRESSION: Left hip arthroplasty without evidence of immediate hardware complication on single frontal view. Electronically Signed   By: Maurine Simmering M.D.   On: 06/25/2021 12:59   DG C-Arm 1-60 Min-No Report  Result Date: 06/25/2021 Fluoroscopy was  utilized by the requesting physician.  No radiographic interpretation.   CT CHEST LUNG CA SCREEN LOW DOSE W/O CM  Result Date: 06/18/2021 CLINICAL DATA:  Forty-five pack-year smoking history. Current smoker. EXAM: CT CHEST WITHOUT CONTRAST LOW-DOSE FOR LUNG CANCER SCREENING TECHNIQUE: Multidetector CT imaging of the chest was performed following the standard protocol without IV contrast. COMPARISON:  04/30/2020 FINDINGS: Cardiovascular: Aortic atherosclerosis. Tortuous thoracic aorta. Heart size accentuated by a pectus excavatum deformity. Lad coronary artery calcification. Mediastinum/Nodes: No mediastinal or definite hilar adenopathy, given limitations of unenhanced CT. Lungs/Pleura: No pleural fluid. Mild centrilobular emphysema. Bilateral pulmonary nodules of maximally volume derived equivalent diameter 3.5 mm. Upper Abdomen: Cholecystectomy. Normal imaged portions of the liver, spleen, stomach, pancreas, adrenal glands, kidneys. Musculoskeletal: Lower cervical and thoracolumbar junction spine fixation. IMPRESSION: 1. Lung-RADS 2, benign appearance or behavior. Continue annual screening with low-dose chest CT without contrast in 12 months. 2. Aortic Atherosclerosis (ICD10-I70.0) and Emphysema (ICD10-J43.9). Coronary artery atherosclerosis. Electronically Signed   By: Abigail Miyamoto M.D.   On: 06/18/2021 09:02   DG HIP UNILAT WITH PELVIS 1V LEFT  Result Date: 06/25/2021 CLINICAL DATA:  Left hip replacement EXAM: DG HIP (WITH OR WITHOUT PELVIS) 1V*L* COMPARISON:  Radiograph 06/01/2021 FINDINGS: Intraoperative images during left hip arthroplasty. Normal alignment at evidence of loosening or periprosthetic fracture. Expected soft tissue changes. Likely calcified uterine fibroid. IMPRESSION: Intraoperative images during left hip arthroplasty. No evidence of immediate hardware complication. Electronically Signed   By: Maurine Simmering M.D.   On: 06/25/2021 13:00   XR Foot Complete Left  Result Date:  06/09/2021 3 views left foot show no acute findings.  XR HIP UNILAT W OR W/O PELVIS 2-3 VIEWS LEFT  Result Date: 06/01/2021 An AP pelvis and lateral left hip shows severe joint space narrowing which is quite significant on the left side.  There is also irregularity of the femoral head suggesting subchondral collapse with avascular necrosis.  XR Tibia/Fibula Left  Result Date: 06/09/2021 2 views of the tibia and fibula show no acute findings.   Disposition: Discharge disposition: 01-Home or Self Care          Follow-up Information     Mcarthur Rossetti, MD Follow up in 2 week(s).   Specialty: Orthopedic Surgery Contact information: [address] [place] [address] [phone]                  Signed: Mcarthur Rossetti 06/26/2021, 10:57 AM

## 2021-06-26 NOTE — Progress Notes (Signed)
Subjective: 1 Day Post-Op Procedure(s) (LRB): LEFT TOTAL HIP ARTHROPLASTY ANTERIOR APPROACH (Left) Patient reports pain as moderate.  Acute blood loss anemia from surgery on top of chronic anemia.  Completely asymptomatic.  Wants to go home.  Objective: Vital signs in last 24 hours: Temp:  [97.5 F (36.4 C)-99.1 F (37.3 C)] 99.1 F (37.3 C) (11/19 1029) Pulse Rate:  [58-94] 92 (11/19 1029) Resp:  [8-18] 18 (11/19 1029) BP: (94-146)/(59-90) 127/83 (11/19 1029) SpO2:  [94 %-100 %] 97 % (11/19 1029) Weight:  [66.3 kg] 66.3 kg (11/18 1410)  Intake/Output from previous day: 11/18 0701 - 11/19 0700 In: 3000.9 [P.O.:720; I.V.:1981.3; IV Piggyback:299.6] Out: 1175 [Urine:1075; Blood:100] Intake/Output this shift: No intake/output data recorded.  Recent Labs    06/26/21 0337  HGB 9.2*   Recent Labs    06/26/21 0337  WBC 8.2  RBC 3.70*  HCT 30.5*  PLT 224   Recent Labs    06/26/21 0337  NA 136  K 3.6  CL 105  CO2 24  BUN 17  CREATININE 0.58  GLUCOSE 159*  CALCIUM 8.3*   No results for input(s): LABPT, INR in the last 72 hours.  Sensation intact distally Intact pulses distally Dorsiflexion/Plantar flexion intact Incision: dressing C/D/I   Assessment/Plan: 1 Day Post-Op Procedure(s) (LRB): LEFT TOTAL HIP ARTHROPLASTY ANTERIOR APPROACH (Left)  Discharge to home today.      Mcarthur Rossetti 06/26/2021, 10:54 AM

## 2021-06-26 NOTE — Progress Notes (Signed)
Physical Therapy Treatment Patient Details Name: Katie Gaines MRN: [id number] DOB: Jul 16, 1956 Today's Date: 06/26/2021   History of Present Illness Patient is 65 y.o. female s/p Lt THA anterior approach on 06/25/21 with PMH significant for anemia, OA, anxiety, Graves disease, HTN, hypothyroidism, back surgery, lumbar fusion.    PT Comments    Pt is progressing well with mobility and is ready to DC home from a PT standpoint. She ambulated 120' with RW, completed stair training, and demonstrates a good understanding of HEP.     Recommendations for follow up therapy are one component of a multi-disciplinary discharge planning process, led by the attending physician.  Recommendations may be updated based on patient status, additional functional criteria and insurance authorization.  Follow Up Recommendations  Follow physician's recommendations for discharge plan and follow up therapies     Assistance Recommended at Discharge Frequent or constant Supervision/Assistance  Equipment Recommendations  Rolling walker (2 wheels)    Recommendations for Other Services       Precautions / Restrictions Precautions Precautions: Fall Restrictions Weight Bearing Restrictions: No Other Position/Activity Restrictions: WBAT     Mobility  Bed Mobility Overal bed mobility: Needs Assistance Bed Mobility: Supine to Sit;Sit to Supine     Supine to sit: Modified independent (Device/Increase time);HOB elevated Sit to supine: Min assist   General bed mobility comments: used bedrail, min A for LLE into bed    Transfers Overall transfer level: Needs assistance Equipment used: Rolling walker (2 wheels) Transfers: Sit to/from Stand Sit to Stand: Supervision           General transfer comment: VCs for hand placement    Ambulation/Gait Ambulation/Gait assistance: Supervision Gait Distance (Feet): 110 Feet Assistive device: Rolling walker (2 wheels) Gait Pattern/deviations: Step-to  pattern;Decreased stride length Gait velocity: decr     General Gait Details: steady, good sequencing, no loss of balance   Stairs Stairs: Yes Stairs assistance: Min assist Stair Management: One rail Right;Step to pattern;Forwards Number of Stairs: 3 General stair comments: VCs sequencing, min A to manage RW, no LOB   Wheelchair Mobility    Modified Rankin (Stroke Patients Only)       Balance Overall balance assessment: Needs assistance Sitting-balance support: Feet supported Sitting balance-Leahy Scale: Good     Standing balance support: Reliant on assistive device for balance;During functional activity;Bilateral upper extremity supported Standing balance-Leahy Scale: Poor                              Cognition Arousal/Alertness: Awake/alert Behavior During Therapy: WFL for tasks assessed/performed Overall Cognitive Status: Within Functional Limits for tasks assessed                                          Exercises Total Joint Exercises Ankle Circles/Pumps: AROM;Both;20 reps;Seated Quad Sets: AROM;Left;5 reps;Supine Short Arc Quad: AROM;Left;10 reps;Supine Heel Slides: AAROM;Left;10 reps;Supine Hip ABduction/ADduction: AAROM;Left;5 reps;Supine    General Comments        Pertinent Vitals/Pain Pain Assessment: 0-10 Pain Score: 8  Pain Location: L hip with walking Pain Descriptors / Indicators: Sore;Aching Pain Intervention(s): Limited activity within patient's tolerance;Monitored during session;Premedicated before session;Ice applied    Home Living                          Prior Function  PT Goals (current goals can now be found in the care plan section) Acute Rehab PT Goals Patient Stated Goal: be able to go to grandkids' games PT Goal Formulation: With patient Time For Goal Achievement: 07/02/21 Potential to Achieve Goals: Good Progress towards PT goals: Progressing toward goals     Frequency    7X/week      PT Plan Current plan remains appropriate    Co-evaluation              AM-PAC PT "6 Clicks" Mobility   Outcome Measure  Help needed turning from your back to your side while in a flat bed without using bedrails?: None Help needed moving from lying on your back to sitting on the side of a flat bed without using bedrails?: A Little Help needed moving to and from a bed to a chair (including a wheelchair)?: None Help needed standing up from a chair using your arms (e.g., wheelchair or bedside chair)?: None Help needed to walk in hospital room?: None Help needed climbing 3-5 steps with a railing? : A Little 6 Click Score: 22    End of Session Equipment Utilized During Treatment: Gait belt Activity Tolerance: Patient tolerated treatment well Patient left: in chair;with call bell/phone within reach;with chair alarm set Nurse Communication: Mobility status PT Visit Diagnosis: Muscle weakness (generalized) (M62.81);Difficulty in walking, not elsewhere classified (R26.2)     Time: 5909-3112 PT Time Calculation (min) (ACUTE ONLY): 24 min  Charges:  $Gait Training: 8-22 mins $Therapeutic Exercise: 8-22 mins                     Blondell Reveal Kistler PT 06/26/2021  Acute Rehabilitation Services Pager [phone] Office [phone]

## 2021-06-26 NOTE — Discharge Instructions (Signed)

## 2021-06-28 ENCOUNTER — Encounter (HOSPITAL_COMMUNITY): Payer: Self-pay | Admitting: Orthopaedic Surgery

## 2021-07-08 ENCOUNTER — Encounter: Payer: Self-pay | Admitting: Orthopaedic Surgery

## 2021-07-08 ENCOUNTER — Ambulatory Visit (INDEPENDENT_AMBULATORY_CARE_PROVIDER_SITE_OTHER): Payer: Medicare Other | Admitting: Orthopaedic Surgery

## 2021-07-08 ENCOUNTER — Other Ambulatory Visit: Payer: Self-pay

## 2021-07-08 DIAGNOSIS — Z96642 Presence of left artificial hip joint: Secondary | ICD-10-CM

## 2021-07-08 MED ORDER — TIZANIDINE HCL 4 MG PO TABS: 4.0000 mg | ORAL_TABLET | Freq: Three times a day (TID) | ORAL | 1 refills | Status: DC | PRN

## 2021-07-08 NOTE — Progress Notes (Signed)
The patient is 2 weeks tomorrow status post a left total hip arthroplasty to treat left hip avascular porosis.  She is ambulating with a cane and says she is doing well overall.  She reports increased range of motion and strength.  She has been taking a baby aspirin twice a day.  She is requesting a muscle relaxant.  She is on chronic narcotic pain medications.  On exam her left hip incision looks good.  The staples are removed and Steri-Strips applied.  Her leg lengths appear equal.  She does tolerate me putting her left hip through internal and external rotation.  I will send in some Zanaflex as a muscle relaxant.  We will see her back in 4 weeks to see how she is doing overall but no x-rays are needed.  She can stop her aspirin as well.

## 2021-07-31 ENCOUNTER — Other Ambulatory Visit: Payer: Self-pay | Admitting: Orthopaedic Surgery

## 2021-08-07 ENCOUNTER — Other Ambulatory Visit: Payer: Self-pay | Admitting: Neurology

## 2021-08-10 ENCOUNTER — Telehealth: Payer: Self-pay | Admitting: Neurology

## 2021-08-10 NOTE — Telephone Encounter (Signed)
Please call pt back. She has not been seen since 2021. She will need to request refills from PCP until she is seen by our office. We can then provide refills once seen.

## 2021-08-10 NOTE — Telephone Encounter (Signed)
Pt request refill for lamoTRIgine (LAMICTAL) 150 MG tablet at [id number]  Scheduled f/u appt on 11/18/21 at 8:30a  Pt would like a call from the nurse to confirm prescription has been sent to the pharmacy.

## 2021-08-11 ENCOUNTER — Encounter: Payer: Medicare Other | Admitting: Physician Assistant

## 2021-08-11 ENCOUNTER — Ambulatory Visit (INDEPENDENT_AMBULATORY_CARE_PROVIDER_SITE_OTHER): Payer: Medicare Other | Admitting: Physician Assistant

## 2021-08-11 ENCOUNTER — Ambulatory Visit: Payer: Self-pay

## 2021-08-11 ENCOUNTER — Encounter: Payer: Self-pay | Admitting: Physician Assistant

## 2021-08-11 ENCOUNTER — Other Ambulatory Visit: Payer: Self-pay

## 2021-08-11 DIAGNOSIS — Z96642 Presence of left artificial hip joint: Secondary | ICD-10-CM

## 2021-08-11 DIAGNOSIS — M25551 Pain in right hip: Secondary | ICD-10-CM

## 2021-08-11 NOTE — Progress Notes (Signed)
HPI: Katie Gaines returns today status post left total hip arthroplasty 6 weeks 5 days ago.  She states left hip is doing well.  She is having some right knee pain and right leg pain that she states is similar to what she had in her left hip due to arthritis.  Denies any right groin pain.  She would like for [place] to x-ray both hips today.  Otherwise her left hip is doing well.  She states that her surgical incisions well-healed.  Physical exam: General well-developed well-nourished female who ambulates without any assistive device.  Nonantalgic gait.  Good range of motion of both hips.  Right hip slight pain with internal rotation.  Circumflexion causes no pain.  Decreased sensation over the lateral aspect of the right hip.  Left calf supple nontender dorsiflexion plantarflexion left foot intact.  Radiographs: AP pelvis lateral view right hip: Status post left total hip arthroplasty with well-seated components.  Right hip is well-maintained.  No acute fractures bony abnormalities.  Both hips well located.  Impression: Status post left total hip arthroplasty Right leg pain  Plan: This point time in regards to the left hip we will see her back in 6 months AP pelvis lateral view of the left hip at that time.  Regards to the right hip reassurance is given radiographs show a well preserved hip.  She states that she did like to see her neurosurgeon Dr. Saintclair Halsted and see if may be the pain down her right leg is coming from her back.  She has had multiple back surgeries in the past.  She will follow-up with [place] sooner for the right hip if she has any increasing pain.  Neck step would be either an intra-articular injection for both therapeutic and diagnostic purposes versus an MRI if she continues to have pain.

## 2021-08-11 NOTE — Telephone Encounter (Signed)
Have spoke with the patient, relayed previous message. Inform patient will be able to do refills after appt scheduled on 11/18/21. Pt verbalized understand, stated will call PCP.

## 2021-08-11 NOTE — Addendum Note (Signed)
Addended by: Erskine Emery on: 08/11/2021 03:40 PM   Modules accepted: Orders

## 2021-08-12 ENCOUNTER — Encounter: Payer: Medicare Other | Admitting: Orthopaedic Surgery

## 2021-08-26 IMAGING — CT CT L SPINE W/O CM
4 of 6 series · 13 of 33 positions shown, 15 images · non-contrast
Comparison: 02/07/2020

CLINICAL DATA: Herniated lumbar disc without myelopathy. Prior
fusion.

EXAM:
CT LUMBAR SPINE WITHOUT CONTRAST
TECHNIQUE: Multidetector CT imaging of the lumbar spine was performed without
intravenous contrast administration. Multiplanar CT image
reconstructions were also generated.

[Series 3: l-spine 2.00 br40 s3 (person_name) · axial · 0.31mm/px · z∈[+1436,+1506]mm · 2 of 107 slices shown]
[im 36/107  bone]
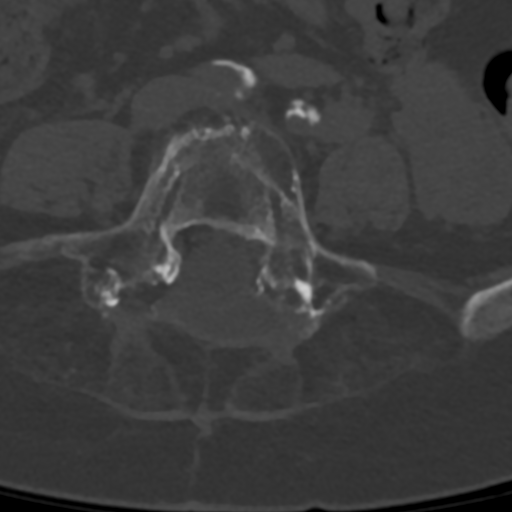
[im 71/107  bone]
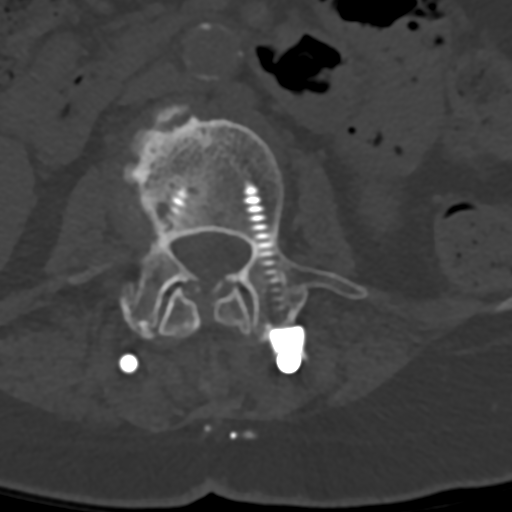

[Series 7: l-spine 2.00 br44 s3 sag soft · sagittal · 0.31mm/px · 5 of 75 slices shown, 6 images]
[im 25/75  bone]
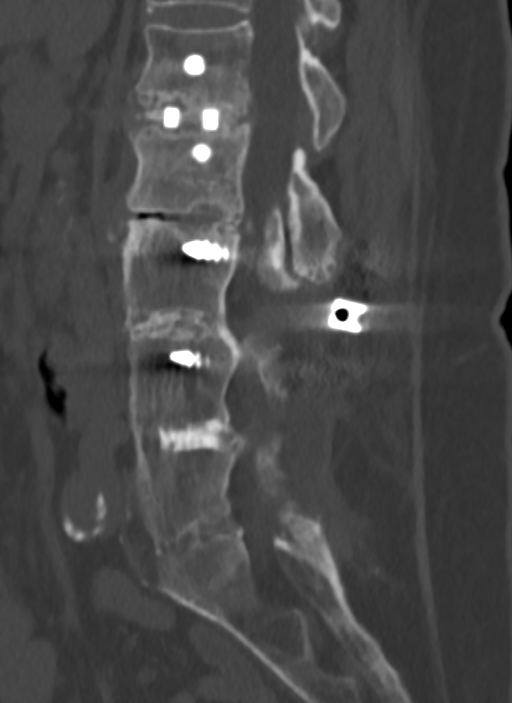
[im 31/75  bone]
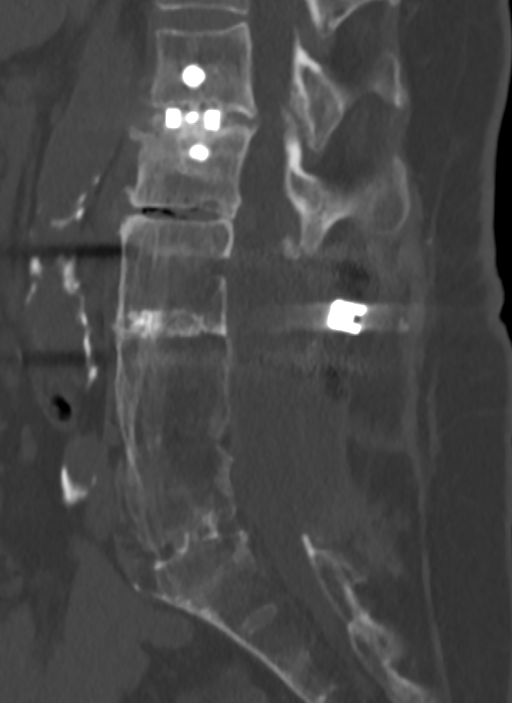
[im 38/75  soft-tissue]
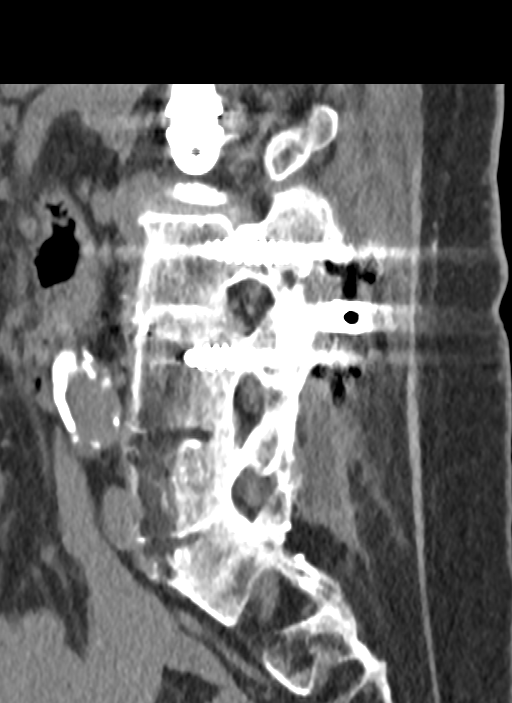
[im 38/75  bone]
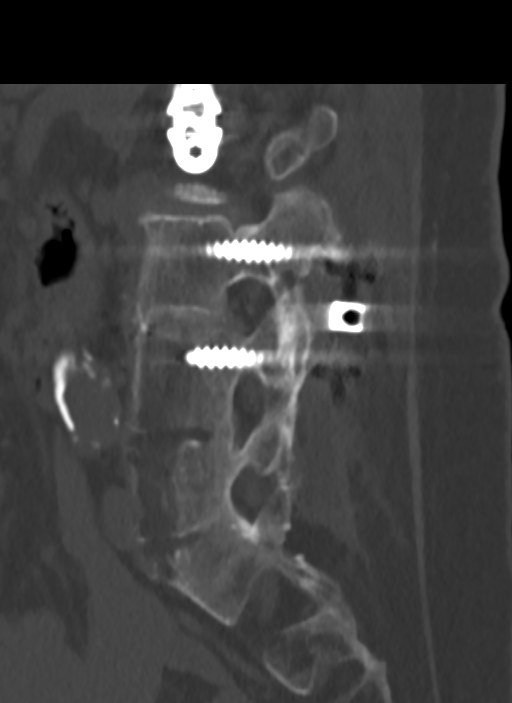
[im 44/75  bone]
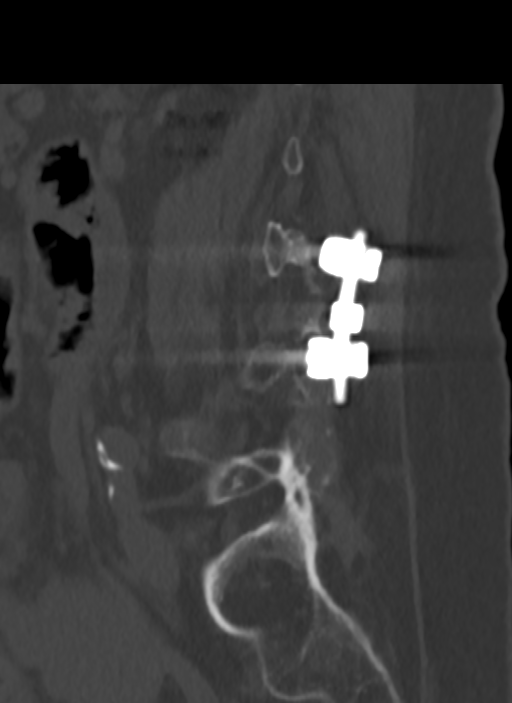
[im 50/75  bone]
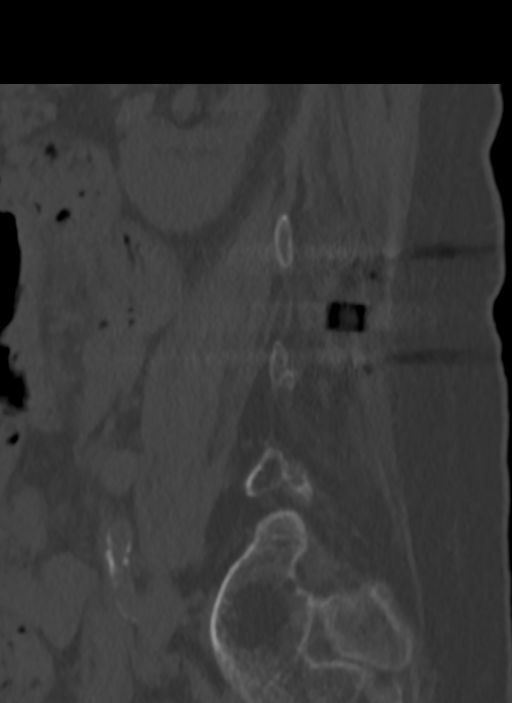

[Series 9: l-spine 2.00 br60 s3 cor bone · coronal · 0.31mm/px · 3 of 77 slices shown]
[im 16/77  bone]
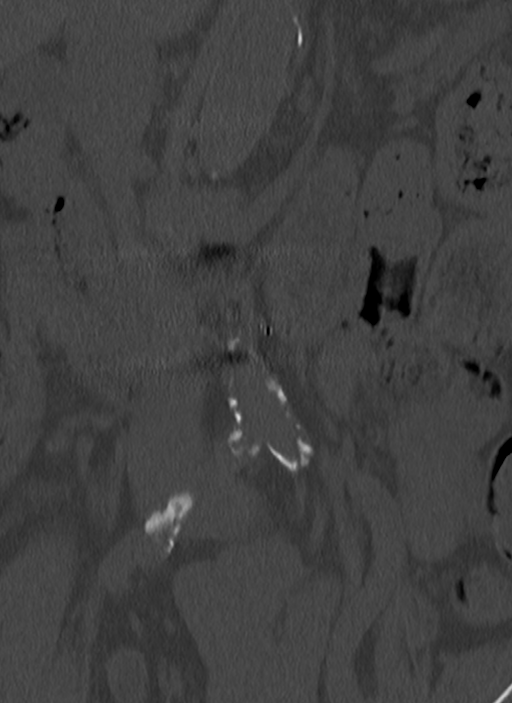
[im 31/77  bone]
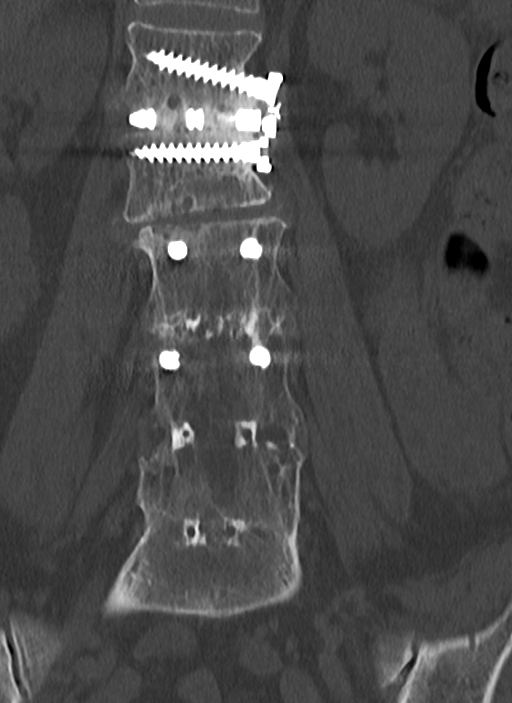
[im 46/77  bone]
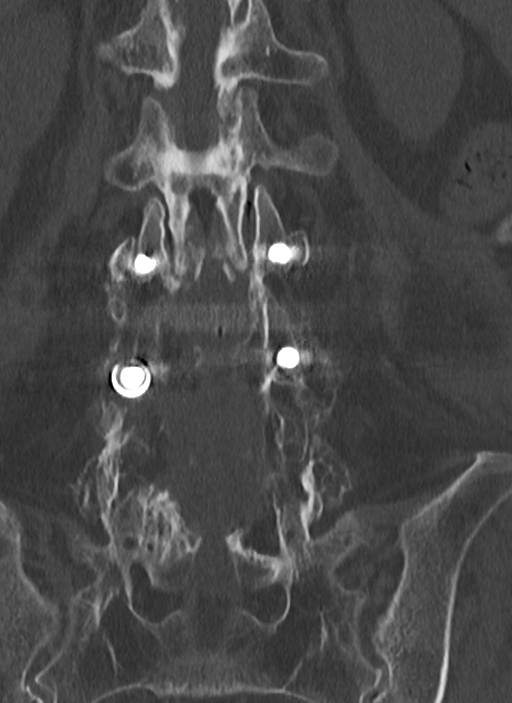

[Series 12: l-spine 2.00 br60 s3 true axials bone · axial · 0.31mm/px · z∈[+1345,+1588]mm · 3 of 103 slices shown, 4 images]
[im 1/103  soft-tissue]
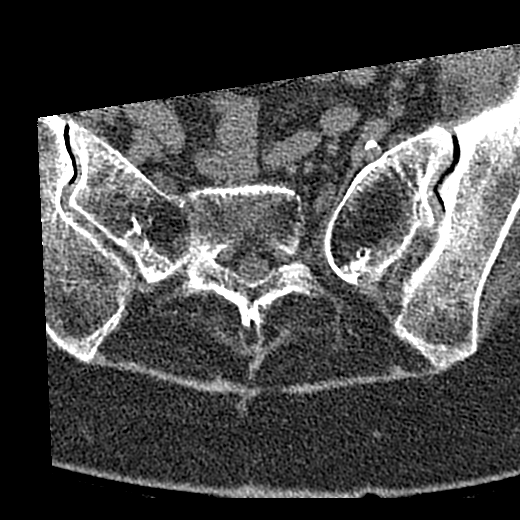
[im 1/103  bone]
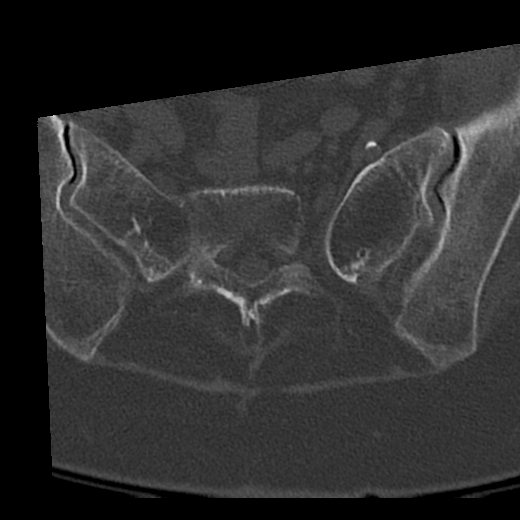
[im 52/103  bone]
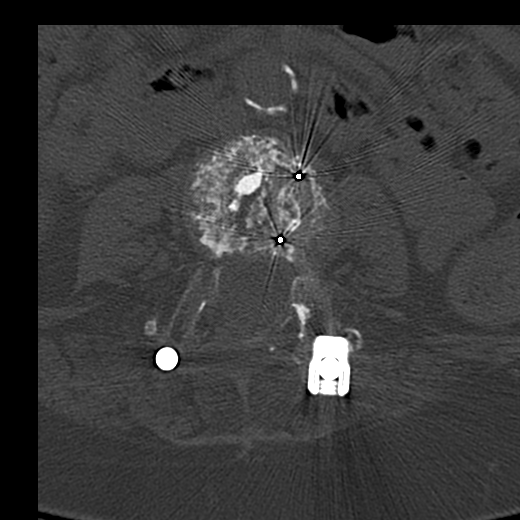
[im 103/103  bone]
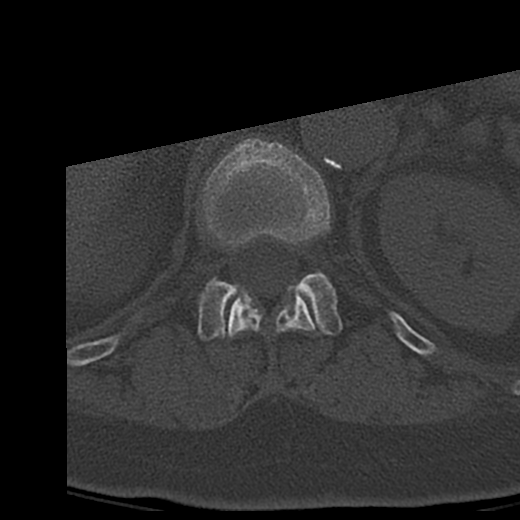

[13 of 33 positions shown; findings below may reference images not displayed]

FINDINGS: Segmentation: 5 lumbar type vertebrae.

Alignment: Mild upper lumbar dextroscoliosis. Straightening of the
normal lumbar lordosis. No significant listhesis in the sagittal
plane.

Vertebrae: No acute fracture or suspicious osseous lesion. Prior
L1-2 lateral interbody fusion with solid osseous fusion across the
disc space. Prior L3-S1 fusion with solid interbody and posterior
element osseous fusion at each level. Pedicle screws remain in place
bilaterally at L3-4. Old pedicle screw tracks at L5 and S1.

Paraspinal and other soft tissues: Postoperative changes in the
posterior lumbar soft tissues with a small chronic fluid collection
in the L4-5 laminectomy bed. Abdominal aortic atherosclerosis
without aneurysm. Punctate nonobstructing right upper pole renal
calculus. Mild chronic extrahepatic biliary dilatation, likely
physiologic following cholecystectomy.

Disc levels: L1-2: Prior fusion. Mild left eccentric endplate
spurring without significant stenosis.

L2-3: Greatly progressive disc degeneration with severe disc space
narrowing, vacuum disc, and prominent degenerative endplate changes.
Progressive circumferential disc bulging and severe facet and
ligamentum flavum hypertrophy result in mild-to-moderate spinal,
lateral recess, and neural foraminal stenosis bilaterally.

L3-4 to L5-S1: Prior posterior decompression and fusion. No
stenosis.
IMPRESSION: 1. Progressive adjacent segment disease at L2-3 with
mild-to-moderate spinal and neural foraminal stenosis.
2. Solid L1-2 and L3-S1 fusion.
3. Aortic Atherosclerosis ([id number]).

## 2021-08-27 IMAGING — RF DG C-ARM 1-60 MIN
1 series · 2 of 2 positions shown · non-contrast
Comparison: Lumbar spine CT 03/16/2021

CLINICAL DATA: Surgery, elective EUG.L ([id number]). Additional
history provided by technologist: Posterior lumbar interbody fusion
lumbar 2-lumbar 3 cortical screws globus exploration fusion remove
of hardware-posterolateral and interbody fusion. Provided
fluoroscopy time 27 seconds (10.73 mGy).

EXAM:
LUMBAR SPINE - 2-3 VIEW; DG C-ARM 1-60 MIN

[Series 1: run · 2 of 2 slices shown]
[im 1/2]
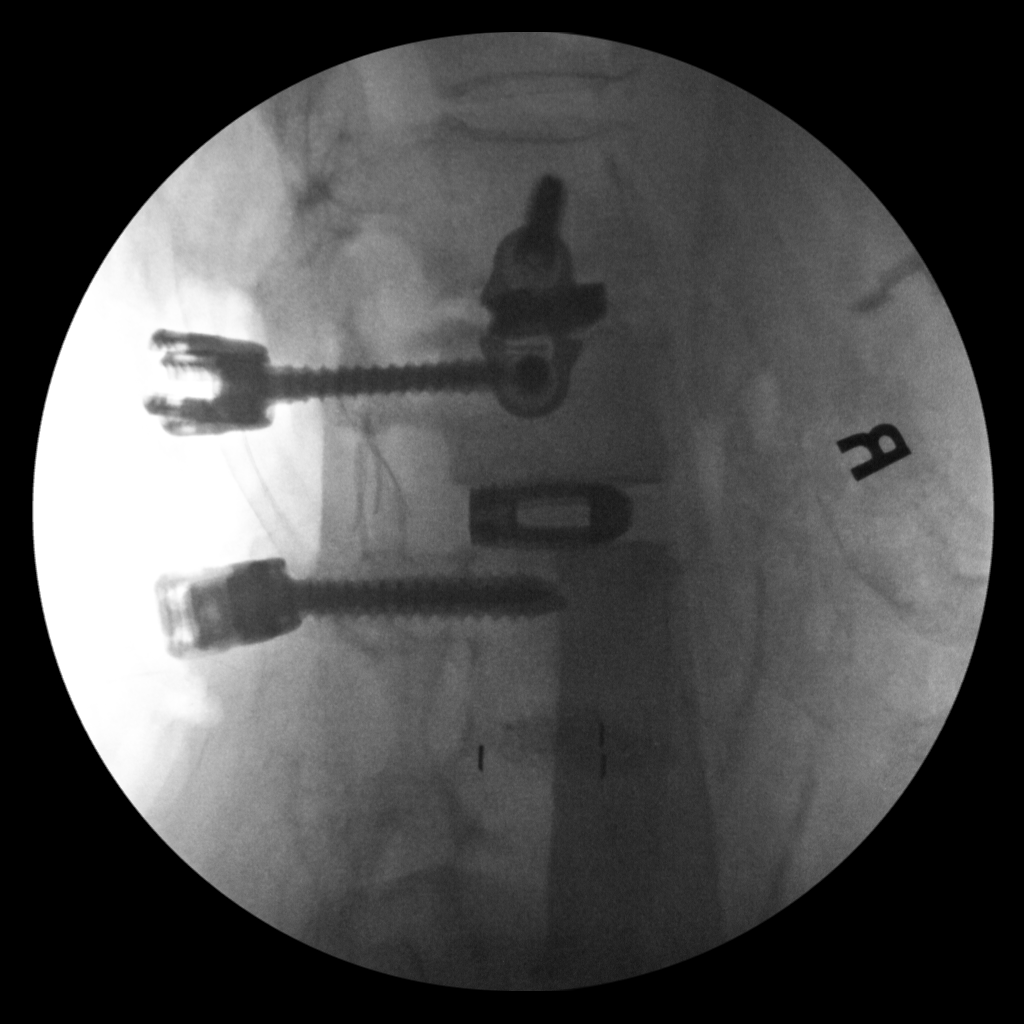
[im 2/2]
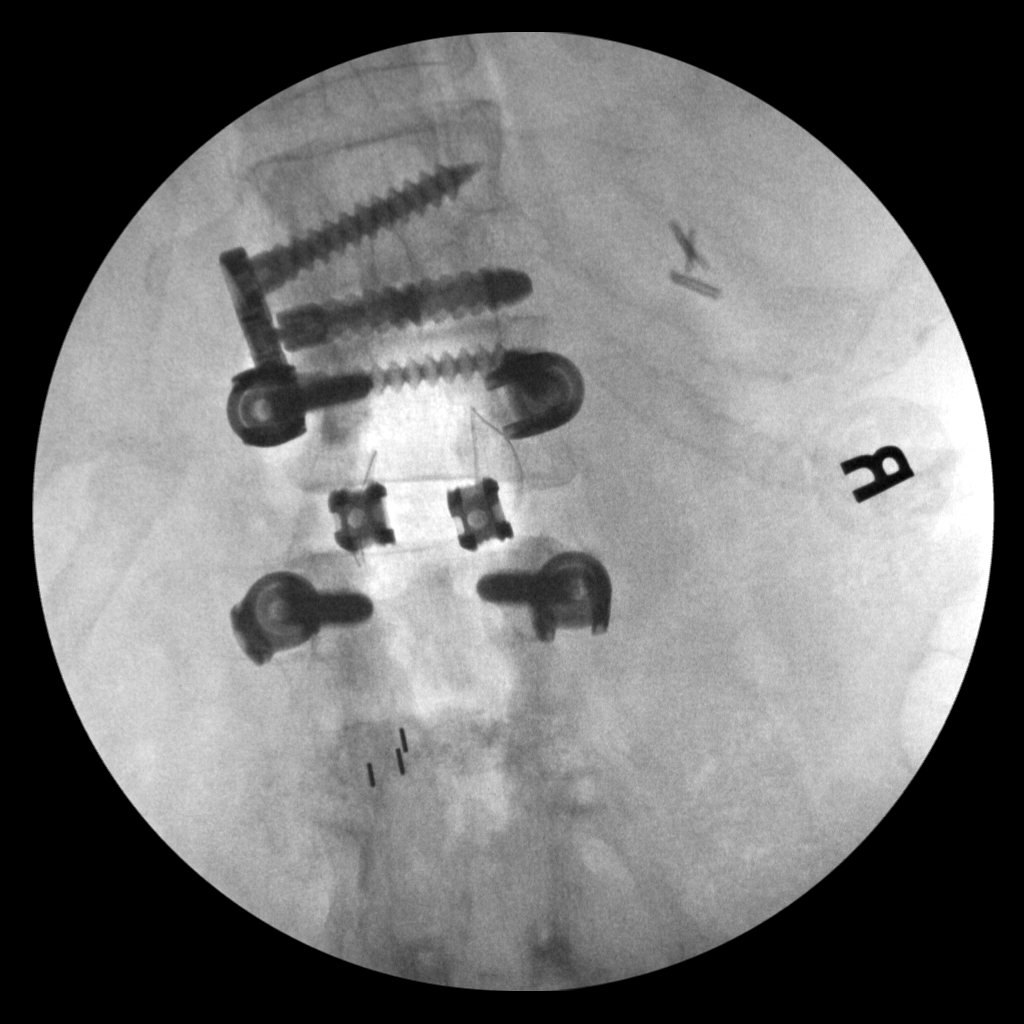

[2 of 2 positions shown; findings below may reference images not displayed]

FINDINGS: AP and lateral view intraoperative fluoroscopic images of the lumbar
spine are submitted, 2 images total. Redemonstrated interbody device
and lateral fixation hardware at L1-L2. There are bilateral pedicle
screws at L2 and L3. Vertical interconnecting rods were not present
at this level at the time the images were taken. Interbody device(s)
also now present at L2-L3. Redemonstrated interbody device(s) at
L3-L4. Surgical clips within the right upper quadrant of the
abdomen.
IMPRESSION: Two intraoperative fluoroscopic images of the lumbar spine, as
described. Correlate with the operative history.

## 2021-09-06 ENCOUNTER — Encounter: Payer: Self-pay | Admitting: Physician Assistant

## 2021-09-06 ENCOUNTER — Ambulatory Visit (INDEPENDENT_AMBULATORY_CARE_PROVIDER_SITE_OTHER): Payer: Medicare Other | Admitting: Physician Assistant

## 2021-09-06 DIAGNOSIS — Z96642 Presence of left artificial hip joint: Secondary | ICD-10-CM

## 2021-09-06 NOTE — Progress Notes (Signed)
HPI: Ms. stands comes in today status post left total hip arthroplasty 06/25/2021.  She states that she had been doing some bending and had some pain and tingling in her left hip thigh area.  She also noted a pop in the hip.  Today she is doing well.  Otherwise she is back to normal activities.  She is just 9 weeks status post surgery.   Physical exam: Left hip excellent range of motion without pain.  Ambulates without any assistive device and a nonantalgic gait.  Impression: Status post left total hip arthroplasty 06/25/2021  Plan: Reassurance was given that she may have broken up some scar tissue or possibly even a capsular suture was broken.  However she is overall doing well.  She has excellent range of motion of the hip today.  We will see her back at her regularly scheduled appointment.  Questions were encouraged and answered.  She will follow-up sooner if she has any other concerns or questions.

## 2021-09-13 ENCOUNTER — Other Ambulatory Visit: Payer: Self-pay | Admitting: Neurosurgery

## 2021-09-17 ENCOUNTER — Ambulatory Visit: Admit: 2021-09-17 | Payer: Medicare Other | Admitting: Neurosurgery

## 2021-09-17 SURGERY — PERONEAL NERVE DECOMPRESSION
Anesthesia: General | Laterality: Right

## 2021-11-18 ENCOUNTER — Encounter: Payer: Self-pay | Admitting: Neurology

## 2021-11-18 ENCOUNTER — Ambulatory Visit (INDEPENDENT_AMBULATORY_CARE_PROVIDER_SITE_OTHER): Payer: Medicare Other | Admitting: Neurology

## 2021-11-18 VITALS — BP 149/82 | HR 73 | Ht 66.0 in | Wt 154.0 lb

## 2021-11-18 DIAGNOSIS — R208 Other disturbances of skin sensation: Secondary | ICD-10-CM | POA: Diagnosis not present

## 2021-11-18 DIAGNOSIS — R200 Anesthesia of skin: Secondary | ICD-10-CM | POA: Diagnosis not present

## 2021-11-18 DIAGNOSIS — G5702 Lesion of sciatic nerve, left lower limb: Secondary | ICD-10-CM | POA: Diagnosis not present

## 2021-11-18 DIAGNOSIS — Z981 Arthrodesis status: Secondary | ICD-10-CM | POA: Diagnosis not present

## 2021-11-18 MED ORDER — LAMOTRIGINE 150 MG PO TABS: 150.0000 mg | ORAL_TABLET | Freq: Two times a day (BID) | ORAL | 3 refills | Status: AC

## 2021-11-18 NOTE — Progress Notes (Signed)
? ?[facility] ? ?PATIENT: Katie Gaines ?DOB: 12-05-1955 ? ?REFERRING DOCTOR OR PCP:  Dr. Saintclair Halsted ([facility]); PCP is Anastasia Pall ?SOURCE: patient, notes from Dr. Saintclair Halsted, Dr. Melford Aase nad Dr Marcelline Deist ? ?_________________________________ ? ? ?HISTORICAL ? ?CHIEF COMPLAINT:  ?Chief Complaint  ?Patient presents with  ? Follow-up  ?  Rm 1 here for yearly f/u reports she has been doing well. No complaints.   ? ? ?HISTORY OF PRESENT ILLNESS:  ?Katie Gaines is a 66 year old woman with a history of multiple cervical and lumbar surgical procedures who has pain.  .  ? ?Update 11/18/2021: ?She is doing well neurologically.   The shaking sensation and dysesthesia improved after starting lamotrigine.   She has only rarely felt the facial numbness that she was experiencing.  She still gets a sensaion like water running down her leg at times. ? ?She is on lamotrigine 150 mg bid and she tolerates it well.   She is on Morphine 30 mg po bid ([facility] [place]).  Also on gabapentin and amitriptyline ? ?She has had some foot pain  She saw podiatry for plantar fasciitis and had an injection and was placed on a steroid pack followed by meloxiacam.  ? ?She has a h/o Grave's disease and was initially treated with radiation and then Synthroid.  More recently, she reports she was hypothyroidism.  ? ?She had recent fusion L2-L3 PLIF 03/17/2021 -- now fused L1-S1.  Then she had a peroneal decompression on the right due to pain.    NCV/EMG in 2021 had shown chronic peroneal innervated denervation and multilevel lumbar radiculopathies.    ? ?Pain and spine history: ?She is a 66 y.o. woman with a history of multiple cervical and lumbar surgical procedures who had increased symptoms late last year and had L1/L2 ALIF 08/26/19 (Dr. Saintclair Halsted).  Initially, she felt she was doing better.  A couple weeks after the surgery, she started to experience dizziness.  Two to three weeks later (mid to late February) she began to notice weakness and  numbness in her left side.  This started with numbness in the left leg ut then she also experienced numbness in the left face.    She has a pressure sensation in her left face, in and near the eye.    She has mild numbness off/on in the hand but it is mild.   ?  ?She sees [facility] in [place] for pain in her left leg/foot.  This has been for many years.   She had a spinal cord stimulator for a while.   Besides multiple operations on her lumbar spine, she also had a left peroneal release operation in 2019.   She alsois fused from C3 to C7 (hardware C3C4 and C6C7 on last MRI 2017).   Currently she is on fentanyl, Elavil and gabapentin. ?  ?MRI and CT images ?MRI Brain 02/05/2020: The MRI is normal for age with just a single T2/flair hyperintense focus in the left frontal lobe that is unlikely to be significant.  No evidence of MS. ?  ?CT lumbar 02/05/2020: L3-S1 PLIF and L1-L2 left lateral fusion/discectomy.  The fusion appears solid.  There is disc protrusion at L2-L3 and facet hypertrophy combining to cause mild spinal stenosis and mild right foraminal narrowing. ?  ?MRI cervical 11/13/2015: Anterior fusion from C3-C7.  Hardware is in place at C3-C4 and C6-C7.  There is solid osseous fusion at C4-C5 and C5-C6 but not noted at C3-C4 and  C6-C7 consistent with pseudoarthrosis.  There is disc protrusion and facet hypertrophy at C2-C3 causing mild right foraminal narrowing but no nerve root compression. ?  ?NCV/EMG 06/02/2020:  ?1.   Moderate chronic denervation in peroneal innervated muscles on the left consistent with her history of peroneal neuropathy.  There does not appear to be any ongoing process involving that nerve as the nerve conduction studywas normal and there was no abnormal spontaneous activity ?2.   No evidence of other mononeuropathy in the legs. ?3.   Multilevel chronic radiculopathies consistent with her history.  This involves at least the L3, L4, L5 and S1 nerve roots on the left and the L3, L4  and L5 nerve roots on the right ? ? ?REVIEW OF SYSTEMS: ?Constitutional: No fevers, chills, sweats, or change in appetite ?Eyes: No visual changes, double vision, eye pain ?Ear, nose and throat: No hearing loss, ear pain, nasal congestion, sore throat ?Cardiovascular: No chest pain, palpitations ?Respiratory:  No shortness of breath at rest or with exertion.   No wheezes ?GastrointestinaI: No nausea, vomiting, diarrhea, abdominal pain, fecal incontinence ?Genitourinary:  No dysuria, urinary retention or frequency.  No nocturia. ?Musculoskeletal: As above  ?integumentary: No rash, pruritus, skin lesions ?Neurological: as above ?Psychiatric: No depression at this time.  No anxiety ?Endocrine: No palpitations, diaphoresis, change in appetite, change in weigh or increased thirst ?Hematologic/Lymphatic:  No anemia, purpura, petechiae. ?Allergic/Immunologic: No itchy/runny eyes, nasal congestion, recent allergic reactions, rashes ? ?ALLERGIES: ?No Known Allergies ? ?HOME MEDICATIONS: ? ?Current Outpatient Medications:  ?  amitriptyline (ELAVIL) 25 MG tablet, Take 25 mg by mouth at bedtime. , Disp: , Rfl:  ?  amLODipine (NORVASC) 5 MG tablet, Take 5 mg by mouth daily. , Disp: , Rfl:  ?  aspirin-acetaminophen-caffeine (EXCEDRIN MIGRAINE) 250-250-65 MG tablet, Take 1-2 tablets by mouth every 6 (six) hours as needed for headache or migraine., Disp: , Rfl:  ?  cyclobenzaprine (FLEXERIL) 5 MG tablet, Take by mouth., Disp: , Rfl:  ?  ergocalciferol (VITAMIN D2) 1.25 MG (50000 UT) capsule, Take 50,000 Units by mouth every Sunday., Disp: , Rfl:  ?  gabapentin (NEURONTIN) 800 MG tablet, Take 800 mg by mouth 4 (four) times daily. , Disp: , Rfl:  ?  morphine (MSIR) 30 MG tablet, Take 30 mg by mouth 2 (two) times daily as needed., Disp: , Rfl:  ?  nitroGLYCERIN (NITROSTAT) 0.4 MG SL tablet, Place 0.4 mg under the tongue every 5 (five) minutes as needed (esophageal spasms)., Disp: , Rfl:  ?  rosuvastatin (CRESTOR) 10 MG tablet, Take 10  mg by mouth at bedtime., Disp: , Rfl:  ?  SYNTHROID 125 MCG tablet, Take 125 mcg by mouth daily before breakfast., Disp: , Rfl:  ?  lamoTRIgine (LAMICTAL) 150 MG tablet, Take 1 tablet (150 mg total) by mouth 2 (two) times daily. Please call us at [phone] to make follow up visit for further refills., Disp: 180 tablet, Rfl: 3 ? ?PAST MEDICAL HISTORY: ?Past Medical History:  ?Diagnosis Date  ? Anemia   ? Anxiety disorder   ? Cancer [facility])   ? Common bile duct stone   ? Graves disease   ? History of kidney stones   ? Hypertension   ? Hypothyroidism   ? Iron deficiency   ? Lymphedema of leg   ? bilateral  ? ? ?PAST SURGICAL HISTORY: ?Past Surgical History:  ?Procedure Laterality Date  ? ANTERIOR LAT LUMBAR FUSION N/A 08/26/2019  ? Procedure: ANTERIOR LATERAL LUMBAR INTERBODY FUSION, LUMBAR  ONE LUMBAR TWO;  Surgeon: Kary Kos, MD;  Location: [facility];  Service: Neurosurgery;  Laterality: N/A;  anterolateral  ? APPENDECTOMY    ? BACK SURGERY  1987-2007  ? X 10 Surgery  ? CHOLECYSTECTOMY    ? ENTEROSCOPY  11/10/2011  ? Procedure: ENTEROSCOPY;  Surgeon: Inda Castle, MD;  Location: [facility];  Service: Endoscopy;  Laterality: N/A;  ? ERCP  07/19/2011  ? Procedure: ENDOSCOPIC RETROGRADE CHOLANGIOPANCREATOGRAPHY (ERCP);  Surgeon: Inda Castle, MD;  Location: [facility];  Service: Endoscopy;  Laterality: N/A;  ? ESOPHAGOGASTRODUODENOSCOPY  09/2018  ? FRACTURE SURGERY Left 2018  ? left arm fracture  ? HOT HEMOSTASIS  11/10/2011  ? Procedure: HOT HEMOSTASIS (ARGON PLASMA COAGULATION/BICAP);  Surgeon: Inda Castle, MD;  Location: [facility];  Service: Endoscopy;  Laterality: N/A;  ? TOTAL HIP ARTHROPLASTY Left 06/25/2021  ? Procedure: LEFT TOTAL HIP ARTHROPLASTY ANTERIOR APPROACH;  Surgeon: Mcarthur Rossetti, MD;  Location: [facility];  Service: Orthopedics;  Laterality: Left;  ? ? ?FAMILY HISTORY: ?Family History  ?Problem Relation Age of Onset  ? Colon polyps Father   ? Diabetes Father   ? Brain cancer Mother   ?  Colon cancer Neg Hx   ? Malignant hyperthermia Neg Hx   ? ? ?SOCIAL HISTORY: ? ?Social History  ? ?Socioeconomic History  ? Marital status: Married  ?  Spouse name: Not on file  ? Number of children: N

## 2021-12-06 ENCOUNTER — Ambulatory Visit (INDEPENDENT_AMBULATORY_CARE_PROVIDER_SITE_OTHER): Payer: Medicare Other | Admitting: Orthopaedic Surgery

## 2021-12-06 ENCOUNTER — Ambulatory Visit: Payer: Medicare Other | Admitting: Orthopaedic Surgery

## 2021-12-06 ENCOUNTER — Ambulatory Visit (INDEPENDENT_AMBULATORY_CARE_PROVIDER_SITE_OTHER): Payer: Medicare Other

## 2021-12-06 DIAGNOSIS — G8929 Other chronic pain: Secondary | ICD-10-CM

## 2021-12-06 DIAGNOSIS — M79604 Pain in right leg: Secondary | ICD-10-CM

## 2021-12-06 DIAGNOSIS — M25561 Pain in right knee: Secondary | ICD-10-CM

## 2021-12-06 MED ORDER — METHYLPREDNISOLONE ACETATE 40 MG/ML IJ SUSP
40.0000 mg | INTRAMUSCULAR | Status: AC | PRN
  Administered 2021-12-06: 40 mg via INTRA_ARTICULAR

## 2021-12-06 MED ORDER — LIDOCAINE HCL 1 % IJ SOLN
3.0000 mL | INTRAMUSCULAR | Status: AC | PRN
  Administered 2021-12-06: 3 mL

## 2021-12-06 NOTE — Progress Notes (Signed)
? ?Office Visit Note ?  ?Patient: Katie Gaines           ?Date of Birth: 1955/10/07           ?MRN: [id number] ?Visit Date: 12/06/2021 ?             ?Requested by: Chesley Noon, MD ?[place],   [id number] ?PCP: Chesley Noon, MD ? ? ?Assessment & Plan: ?Visit Diagnoses:  ?1. Pain in right leg   ?2. Chronic pain of right knee   ? ? ?Plan: I was able to aspirate 25 to 30 cc of clear yellow fluid from her right knee and then place a steroid injection in the right knee.  She may end up being a good candidate for hyaluronic acid as the next step.  I would like to reevaluate her in 2 weeks.  No x-rays are needed.  All questions and concerns were answered and addressed. ? ?Follow-Up Instructions: Return in about 2 weeks (around 12/20/2021).  ? ?Orders:  ?Orders Placed This Encounter  ?Procedures  ? Large Joint Inj: R knee  ? XR Knee 1-2 Views Right  ? XR Tibia/Fibula Right  ? ?No orders of the defined types were placed in this encounter. ? ? ? ? Procedures: ?Large Joint Inj: R knee on 12/06/2021 9:12 AM ?Indications: diagnostic evaluation and pain ?Details: 22 G 1.5 in needle, superolateral approach ? ?Arthrogram: No ? ?Medications: 3 mL lidocaine 1 %; 40 mg methylPREDNISolone acetate 40 MG/ML ?Outcome: tolerated well, no immediate complications ?Procedure, treatment alternatives, risks and benefits explained, specific risks discussed. Consent was given by the patient. Immediately prior to procedure a time out was called to verify the correct patient, procedure, equipment, support staff and site/side marked as required. Patient was prepped and draped in the usual sterile fashion.  ? ? ? ? ?Clinical Data: ?No additional findings. ? ? ?Subjective: ?Chief Complaint  ?Patient presents with  ? Right Knee - Pain  ? Right Leg - Pain  ?The patient comes in today with a several month history of right knee pain on the medial aspect of her knee with patellofemoral crepitation as well as anterior and  lateral ankle swelling and pain.  Is been no known injury but is a constant ache.  We replaced her left hip in November 2022.  She denies any specific injuries but this is becoming more annoying to her and the pain is becoming more constant with neck.  She does report right knee swelling. ? ?HPI ? ?Review of Systems ?There is no listed fever, chills, nausea, vomiting ? ?Objective: ?Vital Signs: There were no vitals taken for this visit. ? ?Physical Exam ?She is alert and orient x3 and in no acute distress ?Ortho Exam ?Examination of her right knee shows slight varus malalignment.  There is a mild to moderate effusion.  There is pain along the medial joint line and the patellofemoral joint.  There is also pain at the anterior lateral ankle but no swelling down the ankle.  Her hip moves smoothly on that right side.  The right knee does feel ligamentously stable and has good motion but is painful throughout the arc of motion. ?Specialty Comments:  ?No specialty comments available. ? ?Imaging: ?XR Knee 1-2 Views Right ? ?Result Date: 12/06/2021 ?2 views of the left knee show arthritic changes significantly between the patellofemoral joint.  The medial lateral compartments are well-maintained with osteophytes can be seen in the knee with calcifications around the meniscus  as well.  There is a mild effusion as well. ? ?XR Tibia/Fibula Right ? ?Result Date: 12/06/2021 ?2 views of the right tibia and fibula show no acute findings.  ? ? ?PMFS History: ?Patient Active Problem List  ? Diagnosis Date Noted  ? Status post left hip replacement 06/25/2021  ? Avascular necrosis of hip, left ([place]) 06/24/2021  ? Fusion of spine of lumbar region 03/17/2021  ? Common peroneal neuropathy, left 06/02/2020  ? Dysesthesia 03/16/2020  ? Numbness 03/16/2020  ? Visual changes 03/16/2020  ? History of fusion of cervical spine 03/16/2020  ? History of fusion of lumbar spine 03/16/2020  ? Fatigue 11/29/2019  ? DDD (degenerative disc disease), lumbar  08/26/2019  ? Gastroesophageal reflux disease 06/17/2019  ? Vitamin D deficiency 06/17/2019  ? Irritable bowel syndrome with diarrhea 03/06/2019  ? Closed fracture of distal end of radius 10/04/2018  ? Injury of right wrist 09/13/2018  ? Low back pain 07/15/2016  ? Pain syndrome, chronic 09/09/2012  ? Postlaminectomy syndrome, not elsewhere classified 09/09/2012  ? Anemia 05/30/2012  ? Essential hypertension 02/13/2012  ? Chronic gastrointestinal bleeding 01/04/2012  ? Hypothyroidism 01/04/2012  ? Obstruction of bile duct 07/19/2011  ? Abnormal radiographic examination 07/19/2011  ? IRON DEFICIENCY 09/10/2009  ? ?Past Medical History:  ?Diagnosis Date  ? Anemia   ? Anxiety disorder   ? Cancer [facility])   ? Common bile duct stone   ? Graves disease   ? History of kidney stones   ? Hypertension   ? Hypothyroidism   ? Iron deficiency   ? Lymphedema of leg   ? bilateral  ?  ?Family History  ?Problem Relation Age of Onset  ? Colon polyps Father   ? Diabetes Father   ? Brain cancer Mother   ? Colon cancer Neg Hx   ? Malignant hyperthermia Neg Hx   ?  ?Past Surgical History:  ?Procedure Laterality Date  ? ANTERIOR LAT LUMBAR FUSION N/A 08/26/2019  ? Procedure: ANTERIOR LATERAL LUMBAR INTERBODY FUSION, LUMBAR ONE LUMBAR TWO;  Surgeon: Kary Kos, MD;  Location: [facility];  Service: Neurosurgery;  Laterality: N/A;  anterolateral  ? APPENDECTOMY    ? BACK SURGERY  1987-2007  ? X 10 Surgery  ? CHOLECYSTECTOMY    ? ENTEROSCOPY  11/10/2011  ? Procedure: ENTEROSCOPY;  Surgeon: Inda Castle, MD;  Location: [facility];  Service: Endoscopy;  Laterality: N/A;  ? ERCP  07/19/2011  ? Procedure: ENDOSCOPIC RETROGRADE CHOLANGIOPANCREATOGRAPHY (ERCP);  Surgeon: Inda Castle, MD;  Location: [facility];  Service: Endoscopy;  Laterality: N/A;  ? ESOPHAGOGASTRODUODENOSCOPY  09/2018  ? FRACTURE SURGERY Left 2018  ? left arm fracture  ? HOT HEMOSTASIS  11/10/2011  ? Procedure: HOT HEMOSTASIS (ARGON PLASMA COAGULATION/BICAP);  Surgeon: Inda Castle, MD;  Location: [facility];  Service: Endoscopy;  Laterality: N/A;  ? TOTAL HIP ARTHROPLASTY Left 06/25/2021  ? Procedure: LEFT TOTAL HIP ARTHROPLASTY ANTERIOR APPROACH;  Surgeon: Mcarthur Rossetti, MD;  Location: [facility];  Service: Orthopedics;  Laterality: Left;  ? ?Social History  ? ?Occupational History  ? Not on file  ?Tobacco Use  ? Smoking status: Former  ?  Packs/day: 0.50  ?  Years: 45.00  ?  Pack years: 22.50  ?  Types: Cigarettes  ?  Quit date: 09/08/2020  ?  Years since quitting: 1.2  ? Smokeless tobacco: Never  ?Vaping Use  ? Vaping Use: Never used  ?Substance and Sexual Activity  ? Alcohol use: Yes  ?  Alcohol/week: 0.0 standard drinks  ?  Comment: 1 drink per month   ? Drug use: No  ? Sexual activity: Not on file  ? ? ? ? ? ? ?

## 2021-12-20 ENCOUNTER — Telehealth: Payer: Self-pay

## 2021-12-20 ENCOUNTER — Ambulatory Visit (INDEPENDENT_AMBULATORY_CARE_PROVIDER_SITE_OTHER): Payer: Medicare Other | Admitting: Orthopaedic Surgery

## 2021-12-20 ENCOUNTER — Encounter: Payer: Self-pay | Admitting: Orthopaedic Surgery

## 2021-12-20 DIAGNOSIS — G8929 Other chronic pain: Secondary | ICD-10-CM

## 2021-12-20 DIAGNOSIS — M25561 Pain in right knee: Secondary | ICD-10-CM

## 2021-12-20 DIAGNOSIS — M1711 Unilateral primary osteoarthritis, right knee: Secondary | ICD-10-CM

## 2021-12-20 NOTE — Progress Notes (Signed)
The patient comes in for follow-up with mild to moderate osteoarthritis of her right knee.  At her last visit we did aspirate fluid off of the knee and placed a steroid injection in the knee.  She is still having pain in that right knee and it does wake her up at night.  Her pain is definitely osteoarthritis related.  At this point she has failed conservative treatment including activity modification, rest, questioning exercises, anti-inflammatories and now steroid injection.  She is a candidate for hyaluronic acid at this point and she agrees. ? ?On exam her right knee still shows a mild effusion.  There is slight varus malalignment that is correctable and slight patellofemoral crepitation with global pain throughout the arc of motion of her knee. ? ?At this point given the failure of all forms of conservative treatment including steroid injection, the next step will be considering hyaluronic acid for her right knee and she agrees with this treatment plan.  We will hopefully get this approved for her and get her back in once this is approved.  She agrees. ?

## 2021-12-20 NOTE — Telephone Encounter (Signed)
Right knee injection

## 2021-12-21 NOTE — Telephone Encounter (Signed)
Noted  

## 2021-12-24 ENCOUNTER — Other Ambulatory Visit: Payer: Self-pay | Admitting: Acute Care

## 2021-12-24 DIAGNOSIS — F17210 Nicotine dependence, cigarettes, uncomplicated: Secondary | ICD-10-CM

## 2021-12-24 DIAGNOSIS — Z122 Encounter for screening for malignant neoplasm of respiratory organs: Secondary | ICD-10-CM

## 2021-12-24 DIAGNOSIS — Z87891 Personal history of nicotine dependence: Secondary | ICD-10-CM

## 2021-12-31 ENCOUNTER — Telehealth: Payer: Self-pay | Admitting: Physician Assistant

## 2021-12-31 NOTE — Telephone Encounter (Signed)
Talked with patient concerning gel injection.  Patient appointment for gel injection has been scheduled.

## 2021-12-31 NOTE — Telephone Encounter (Signed)
Pt called requesting a call back from April J concerning gel injections. Did not see if it is submitted or another type. Please call pt about this matter. Please call pt at [phone].

## 2022-01-04 ENCOUNTER — Other Ambulatory Visit: Payer: Self-pay

## 2022-01-04 DIAGNOSIS — M1711 Unilateral primary osteoarthritis, right knee: Secondary | ICD-10-CM

## 2022-01-12 ENCOUNTER — Encounter: Payer: Self-pay | Admitting: Orthopaedic Surgery

## 2022-01-12 ENCOUNTER — Ambulatory Visit (INDEPENDENT_AMBULATORY_CARE_PROVIDER_SITE_OTHER): Payer: Medicare Other | Admitting: Orthopaedic Surgery

## 2022-01-12 DIAGNOSIS — M1711 Unilateral primary osteoarthritis, right knee: Secondary | ICD-10-CM

## 2022-01-12 MED ORDER — HYALURONAN 88 MG/4ML IX SOSY
88.0000 mg | PREFILLED_SYRINGE | INTRA_ARTICULAR | Status: AC | PRN
  Administered 2022-01-12: 88 mg via INTRA_ARTICULAR

## 2022-01-12 NOTE — Progress Notes (Signed)
   Procedure Note  Patient: Katie Gaines             Date of Birth: 06-10-1956           MRN: [id number]             Visit Date: 01/12/2022  Procedures: Visit Diagnoses:  1. Unilateral primary osteoarthritis, right knee     Large Joint Inj: R knee on 01/12/2022 3:55 PM Indications: diagnostic evaluation and pain Details: 22 G 1.5 in needle, superolateral approach  Arthrogram: No  Medications: 88 mg Hyaluronan 88 MG/4ML Outcome: tolerated well, no immediate complications Procedure, treatment alternatives, risks and benefits explained, specific risks discussed. Consent was given by the patient. Immediately prior to procedure a time out was called to verify the correct patient, procedure, equipment, support staff and site/side marked as required. Patient was prepped and draped in the usual sterile fashion.   The patient comes in today for hyaluronic acid injection in her right knee to treat the pain from osteoarthritis.  She understands why she is having this injection.  On examination of her right knee today she does have a large effusion.  I aspirated about 35 cc of clear yellow fluid from her knee.  I then placed Monovisc in the right knee without difficulty.  She understands why she is having this done and did feel good relief of having the effusion drained from her knee.  She understands that the earliest this can be tried again would be 6 months.  If this fails the only other treatment option would be considering knee replacement.

## 2022-02-03 ENCOUNTER — Ambulatory Visit (INDEPENDENT_AMBULATORY_CARE_PROVIDER_SITE_OTHER): Payer: Medicare Other | Admitting: Orthopaedic Surgery

## 2022-02-03 ENCOUNTER — Encounter: Payer: Self-pay | Admitting: Orthopaedic Surgery

## 2022-02-03 DIAGNOSIS — M25461 Effusion, right knee: Secondary | ICD-10-CM | POA: Diagnosis not present

## 2022-02-03 DIAGNOSIS — M1711 Unilateral primary osteoarthritis, right knee: Secondary | ICD-10-CM

## 2022-02-03 DIAGNOSIS — M25561 Pain in right knee: Secondary | ICD-10-CM

## 2022-02-03 DIAGNOSIS — G8929 Other chronic pain: Secondary | ICD-10-CM | POA: Diagnosis not present

## 2022-02-03 MED ORDER — LIDOCAINE HCL 1 % IJ SOLN
3.0000 mL | INTRAMUSCULAR | Status: AC | PRN
  Administered 2022-02-03: 3 mL

## 2022-02-03 MED ORDER — METHYLPREDNISOLONE ACETATE 40 MG/ML IJ SUSP
40.0000 mg | INTRAMUSCULAR | Status: AC | PRN
  Administered 2022-02-03: 40 mg via INTRA_ARTICULAR

## 2022-02-03 NOTE — Progress Notes (Signed)
Office Visit Note   Patient: Katie Gaines           Date of Birth: Oct 11, 1955           MRN: [id number] Visit Date: 02/03/2022              Requested by: Chesley Noon, MD [place],  [place] [id number] PCP: Chesley Noon, MD   Assessment & Plan: Visit Diagnoses:  1. Chronic pain of right knee   2. Unilateral primary osteoarthritis, right knee   3. Effusion, right knee     Plan: I was able to withdraw 25 to 30 cc of fluid from her right needed which was mainly clear and yellow.  I then placed a steroid in her right knee.  At this point she does wish to consider knee replacement surgery but likely not until the fall of this year.  I agree with her having that surgery when she is ready.  She has tried and failed all forms of conservative treatment for that knee at this point for over a year now.  All question concerns were answered and addressed.  She has our surgery scheduler's card.  I did talk in length in detail about knee replacement surgery.  She will call when she would like to have the surgery scheduled.  This would be for a right total knee arthroplasty.  Follow-Up Instructions: Return if symptoms worsen or fail to improve.   Orders:  Orders Placed This Encounter  Procedures   Large Joint Inj   No orders of the defined types were placed in this encounter.     Procedures: Large Joint Inj: R knee on 02/03/2022 4:18 PM Indications: diagnostic evaluation and pain Details: 22 G 1.5 in needle, superolateral approach  Arthrogram: No  Medications: 3 mL lidocaine 1 %; 40 mg methylPREDNISolone acetate 40 MG/ML Outcome: tolerated well, no immediate complications Procedure, treatment alternatives, risks and benefits explained, specific risks discussed. Consent was given by the patient. Immediately prior to procedure a time out was called to verify the correct patient, procedure, equipment, support staff and site/side marked as required. Patient was prepped  and draped in the usual sterile fashion.       Clinical Data: No additional findings.   Subjective: Chief Complaint  Patient presents with   Right Knee - Follow-up  The patient comes in for continued follow-up of the right knee.  On June 7 of this month we placed hyaluronic acid in her knee.  Has really not helped and she comes in with a large knee joint effusion today.  She is an active 66 year old female.  We have replaced both of her hips.  She does have known moderate arthritis of her right knee on plain films but most likely this is worse.  HPI  Review of Systems There is currently listed no fever, chills, nausea, vomiting  Objective: Vital Signs: There were no vitals taken for this visit.  Physical Exam She is alert and orient x3 and in no acute distress Ortho Exam Examination of her right knee shows a moderate effusion.  There is good range of motion of the knee and is ligamentously stable. Specialty Comments:  No specialty comments available.  Imaging: No results found.   PMFS History: Patient Active Problem List   Diagnosis Date Noted   Status post left hip replacement 06/25/2021   Avascular necrosis of hip, left ([place]) 06/24/2021   Fusion of spine of lumbar region 03/17/2021  Common peroneal neuropathy, left 06/02/2020   Dysesthesia 03/16/2020   Numbness 03/16/2020   Visual changes 03/16/2020   History of fusion of cervical spine 03/16/2020   History of fusion of lumbar spine 03/16/2020   Fatigue 11/29/2019   DDD (degenerative disc disease), lumbar 08/26/2019   Gastroesophageal reflux disease 06/17/2019   Vitamin D deficiency 06/17/2019   Irritable bowel syndrome with diarrhea 03/06/2019   Closed fracture of distal end of radius 10/04/2018   Injury of right wrist 09/13/2018   Low back pain 07/15/2016   Pain syndrome, chronic 09/09/2012   Postlaminectomy syndrome, not elsewhere classified 09/09/2012   Anemia 05/30/2012   Essential hypertension  02/13/2012   Chronic gastrointestinal bleeding 01/04/2012   Hypothyroidism 01/04/2012   Obstruction of bile duct 07/19/2011   Abnormal radiographic examination 07/19/2011   IRON DEFICIENCY 09/10/2009   Past Medical History:  Diagnosis Date   Anemia    Anxiety disorder    Cancer ([place])    Common bile duct stone    Graves disease    History of kidney stones    Hypertension    Hypothyroidism    Iron deficiency    Lymphedema of leg    bilateral    Family History  Problem Relation Age of Onset   Colon polyps Father    Diabetes Father    Brain cancer Mother    Colon cancer Neg Hx    Malignant hyperthermia Neg Hx     Past Surgical History:  Procedure Laterality Date   ANTERIOR LAT LUMBAR FUSION N/A 08/26/2019   Procedure: ANTERIOR LATERAL LUMBAR INTERBODY FUSION, LUMBAR ONE LUMBAR TWO;  Surgeon: Kary Kos, MD;  Location: [facility];  Service: Neurosurgery;  Laterality: N/A;  anterolateral   APPENDECTOMY     BACK SURGERY  1987-2007   X 10 Surgery   CHOLECYSTECTOMY     ENTEROSCOPY  11/10/2011   Procedure: ENTEROSCOPY;  Surgeon: Inda Castle, MD;  Location: [facility];  Service: Endoscopy;  Laterality: N/A;   ERCP  07/19/2011   Procedure: ENDOSCOPIC RETROGRADE CHOLANGIOPANCREATOGRAPHY (ERCP);  Surgeon: Inda Castle, MD;  Location: [facility];  Service: Endoscopy;  Laterality: N/A;   ESOPHAGOGASTRODUODENOSCOPY  09/2018   FRACTURE SURGERY Left 2018   left arm fracture   HOT HEMOSTASIS  11/10/2011   Procedure: HOT HEMOSTASIS (ARGON PLASMA COAGULATION/BICAP);  Surgeon: Inda Castle, MD;  Location: [facility];  Service: Endoscopy;  Laterality: N/A;   TOTAL HIP ARTHROPLASTY Left 06/25/2021   Procedure: LEFT TOTAL HIP ARTHROPLASTY ANTERIOR APPROACH;  Surgeon: Mcarthur Rossetti, MD;  Location: [facility];  Service: Orthopedics;  Laterality: Left;   Social History   Occupational History   Not on file  Tobacco Use   Smoking status: Former    Packs/day: 0.50    Years: 45.00     Total pack years: 22.50    Types: Cigarettes    Quit date: 09/08/2020    Years since quitting: 1.4   Smokeless tobacco: Never  Vaping Use   Vaping Use: Never used  Substance and Sexual Activity   Alcohol use: Yes    Alcohol/week: 0.0 standard drinks of alcohol    Comment: 1 drink per month    Drug use: No   Sexual activity: Not on file

## 2022-02-09 ENCOUNTER — Ambulatory Visit: Payer: Medicare Other | Admitting: Physician Assistant

## 2022-02-22 ENCOUNTER — Telehealth: Payer: Self-pay | Admitting: Orthopaedic Surgery

## 2022-02-22 NOTE — Telephone Encounter (Signed)
Patient called wanting to schedule surgery with Dr. Ninfa Linden. CB # [phone]

## 2022-02-24 NOTE — Telephone Encounter (Signed)
Spoke with patient and scheduled surgery. °

## 2022-03-02 ENCOUNTER — Ambulatory Visit (INDEPENDENT_AMBULATORY_CARE_PROVIDER_SITE_OTHER): Payer: Medicare Other | Admitting: Orthopaedic Surgery

## 2022-03-02 DIAGNOSIS — G8929 Other chronic pain: Secondary | ICD-10-CM

## 2022-03-02 DIAGNOSIS — M25461 Effusion, right knee: Secondary | ICD-10-CM | POA: Diagnosis not present

## 2022-03-02 DIAGNOSIS — M1711 Unilateral primary osteoarthritis, right knee: Secondary | ICD-10-CM

## 2022-03-02 MED ORDER — LIDOCAINE HCL 1 % IJ SOLN
3.0000 mL | INTRAMUSCULAR | Status: AC | PRN
  Administered 2022-03-02: 3 mL

## 2022-03-02 MED ORDER — METHYLPREDNISOLONE ACETATE 40 MG/ML IJ SUSP
40.0000 mg | INTRAMUSCULAR | Status: AC | PRN
  Administered 2022-03-02: 40 mg via INTRA_ARTICULAR

## 2022-03-02 NOTE — Progress Notes (Signed)
The patient is well-known to me.  She has significant arthritis in her right knee and is scheduled for knee replacement on September 19.  However she has had a recurrent effusion and she is not getting sleep at night and the knee is hurting her quite a bit on that right knee.  The right knee does have a moderate effusion today.  I was able to aspirate about 30 to 40 cc of clear fluid the knee and I felt comfortable placing a steroid injection in her knee since she is over 6 weeks out from having surgery.  This did give her relief.  The next time we will see her at the time of surgery and then 2 weeks after surgery.  All question concerns were answered and addressed.  Procedure Note  Patient: Katie Gaines             Date of Birth: 14-Feb-1956           MRN: [id number]             Visit Date: 03/02/2022  Procedures: Visit Diagnoses:  1. Effusion, right knee   2. Unilateral primary osteoarthritis, right knee   3. Chronic pain of right knee     Large Joint Inj: R knee on 03/02/2022 11:01 AM Indications: diagnostic evaluation and pain Details: 22 G 1.5 in needle, superolateral approach  Arthrogram: No  Medications: 3 mL lidocaine 1 %; 40 mg methylPREDNISolone acetate 40 MG/ML Outcome: tolerated well, no immediate complications Procedure, treatment alternatives, risks and benefits explained, specific risks discussed. Consent was given by the patient. Immediately prior to procedure a time out was called to verify the correct patient, procedure, equipment, support staff and site/side marked as required. Patient was prepped and draped in the usual sterile fashion.

## 2022-03-15 ENCOUNTER — Other Ambulatory Visit: Payer: Self-pay

## 2022-03-29 ENCOUNTER — Encounter: Payer: Self-pay | Admitting: Orthopaedic Surgery

## 2022-03-29 ENCOUNTER — Ambulatory Visit (INDEPENDENT_AMBULATORY_CARE_PROVIDER_SITE_OTHER): Payer: Medicare Other | Admitting: Orthopaedic Surgery

## 2022-03-29 DIAGNOSIS — M25461 Effusion, right knee: Secondary | ICD-10-CM

## 2022-03-29 DIAGNOSIS — M1711 Unilateral primary osteoarthritis, right knee: Secondary | ICD-10-CM

## 2022-03-29 NOTE — Progress Notes (Signed)
The patient comes in today with a recurrent effusion of the right osteoarthritic knee.  She is scheduled for a knee replacement next month.  She does have chronic anemia and she is undergoing infusion therapy.  She does run a low hemoglobin but this should not keep her from having a knee replacement unless the numbers are significantly low.  We will do this under tourniquet and TXA which can be given through the IV and then possibly topically as well.  I did aspirate about 20 cc of fluid off of her right knee today.  We did talk in length again about surgery.  She prefers to not have home therapy and would like to have her outpatient therapy set up at [facility] in [place].  I told her this is reasonable.  Again we will not counsel her and keep her on the schedule until she has her labs drawn before surgery and can make a determination if her hemoglobin is at a level where we can proceed with knee replacement.

## 2022-04-19 NOTE — Progress Notes (Signed)
Surgical Instructions    Your procedure is scheduled on Tuesday September 19th.  Report to [facility] Main Entrance "A" at 10 A.M., then check in with the Admitting office.  Call this number if you have problems the morning of surgery:  [phone]   If you have any questions prior to your surgery date call [phone]: Open Monday-Friday 8am-4pm    Remember:  Do not eat after midnight the night before your surgery  You may drink clear liquids until 9am the morning of your surgery.   Clear liquids allowed are: Water, Non-Citrus Juices (without pulp), Carbonated Beverages, Clear Tea, Black Coffee ONLY (NO MILK, CREAM OR POWDERED CREAMER of any kind), and Gatorade    Take these medicines the morning of surgery with A SIP OF WATER: amLODipine (NORVASC) 5 MG tablet gabapentin (NEURONTIN) 800 MG tablet lamoTRIgine (LAMICTAL) 150 MG tablet omeprazole (PRILOSEC) 40 MG capsule SYNTHROID 125 MCG tablet  IF NEEDED  morphine (MSIR) 30 MG tablet nitroGLYCERIN (NITROSTAT) 0.4 MG SL tablet    As of today, STOP taking any Aspirin (unless otherwise instructed by your surgeon) Aleve, Naproxen, Ibuprofen, Motrin, Advil, Goody's, BC's, all herbal medications, fish oil, and all vitamins.           Do not wear jewelry or makeup. Do not wear lotions, powders, perfumes or deodorant. Do not shave 48 hours prior to surgery.   Do not bring valuables to the hospital. Do not wear nail polish, gel polish, artificial nails, or any other type of covering on natural nails (fingers and toes) If you have artificial nails or gel coating that need to be removed by a nail salon, please have this removed prior to surgery. Artificial nails or gel coating may interfere with anesthesia's ability to adequately monitor your vital signs.  [facility] is not responsible for any belongings or valuables.    Do NOT Smoke (Tobacco/Vaping)  24 hours prior to your procedure  If you use a CPAP at night, you may bring your  mask for your overnight stay.   Contacts, glasses, hearing aids, dentures or partials may not be worn into surgery, please bring cases for these belongings   For patients admitted to the hospital, discharge time will be determined by your treatment team.   Patients discharged the day of surgery will not be allowed to drive home, and someone needs to stay with them for 24 hours.   SURGICAL WAITING ROOM VISITATION Patients having surgery or a procedure may have no more than 2 support people in the waiting area - these visitors may rotate.   Children under the age of 18 must have an adult with them who is not the patient. If the patient needs to stay at the hospital during part of their recovery, the visitor guidelines for inpatient rooms apply. Pre-op nurse will coordinate an appropriate time for 1 support person to accompany patient in pre-op.  This support person may not rotate.   Please refer to the [facility] website for the visitor guidelines for Inpatients (after your surgery is over and you are in a regular room).    Special instructions:    Oral Hygiene is also important to reduce your risk of infection.  Remember - BRUSH YOUR TEETH THE MORNING OF SURGERY WITH YOUR REGULAR TOOTHPASTE   [facility]- Preparing For Surgery  Before surgery, you can play an important role. Because skin is not sterile, your skin needs to be as free of germs as possible. You can reduce the number  of germs on your skin by washing with CHG (chlorahexidine gluconate) Soap before surgery.  CHG is an antiseptic cleaner which kills germs and bonds with the skin to continue killing germs even after washing.     Please do not use if you have an allergy to CHG or antibacterial soaps. If your skin becomes reddened/irritated stop using the CHG.  Do not shave (including legs and underarms) for at least 48 hours prior to first CHG shower. It is OK to shave your face.  Please follow these instructions  carefully.     Shower the NIGHT BEFORE SURGERY and the MORNING OF SURGERY with CHG Soap.   If you chose to wash your hair, wash your hair first as usual with your normal shampoo. After you shampoo, rinse your hair and body thoroughly to remove the shampoo.  Then [employer] and genitals (private parts) with your normal soap and rinse thoroughly to remove soap.  After that Use CHG Soap as you would any other liquid soap. You can apply CHG directly to the skin and wash gently with a scrungie or a clean washcloth.   Apply the CHG Soap to your body ONLY FROM THE NECK DOWN.  Do not use on open wounds or open sores. Avoid contact with your eyes, ears, mouth and genitals (private parts). Wash Face and genitals (private parts)  with your normal soap.   Wash thoroughly, paying special attention to the area where your surgery will be performed.  Thoroughly rinse your body with warm water from the neck down.  DO NOT shower/wash with your normal soap after using and rinsing off the CHG Soap.  Pat yourself dry with a CLEAN TOWEL.  Wear CLEAN PAJAMAS to bed the night before surgery  Place CLEAN SHEETS on your bed the night before your surgery  DO NOT SLEEP WITH PETS.   Day of Surgery:  Take a shower with CHG soap. Wear Clean/Comfortable clothing the morning of surgery Do not apply any deodorants/lotions.   Remember to brush your teeth WITH YOUR REGULAR TOOTHPASTE.    If you received a COVID test during your pre-op visit, it is requested that you wear a mask when out in public, stay away from anyone that may not be feeling well, and notify your surgeon if you develop symptoms. If you have been in contact with anyone that has tested positive in the last 10 days, please notify your surgeon.    Please read over the following fact sheets that you were given.

## 2022-04-20 ENCOUNTER — Encounter (HOSPITAL_COMMUNITY)
Admission: RE | Admit: 2022-04-20 | Discharge: 2022-04-20 | Disposition: A | Discharge status: Home / Self Care | Payer: Medicare Other | Source: Ambulatory Visit | Attending: Orthopaedic Surgery | Admitting: Orthopaedic Surgery

## 2022-04-20 ENCOUNTER — Other Ambulatory Visit: Payer: Self-pay

## 2022-04-20 ENCOUNTER — Encounter (HOSPITAL_COMMUNITY): Payer: Self-pay

## 2022-04-20 VITALS — BP 121/72 | HR 77 | Temp 98.3°F | Resp 17 | Ht 66.0 in | Wt 151.3 lb

## 2022-04-20 DIAGNOSIS — Z01818 Encounter for other preprocedural examination: Secondary | ICD-10-CM | POA: Insufficient documentation

## 2022-04-20 DIAGNOSIS — I10 Essential (primary) hypertension: Secondary | ICD-10-CM | POA: Insufficient documentation

## 2022-04-20 LAB — SURGICAL PCR SCREEN
MRSA, PCR: NEGATIVE
Staphylococcus aureus: NEGATIVE

## 2022-04-20 NOTE — Progress Notes (Signed)
PCP - Anastasia Pall Cardiologist - Denies  PPM/ICD - Denies  Chest x-ray - NI EKG - 04/20/22 Stress Test - Yes 5-6 years ago "I was fine" no f/u  ECHO - Denies Cardiac Cath - Denies  Sleep Study - Denies  DM - Denies  Blood Thinner Instructions: Denies Aspirin Instructions:Denies  ERAS Protcol -Yes PRE-SURGERY Ensure given   COVID TEST- NI   Anesthesia review: No  Patient denies shortness of breath, fever, cough and chest pain at PAT appointment   All instructions explained to the patient, with a verbal understanding of the material. Patient agrees to go over the instructions while at home for a better understanding.  The opportunity to ask questions was provided.

## 2022-04-20 NOTE — Progress Notes (Signed)
Labs drawn in 04/15/22 in [facility]  Dr. Ninfa Linden had ordered a CBC and BMP.  Patient has anemia and would benefit from CBC drawn on DOS.

## 2022-04-26 ENCOUNTER — Encounter (HOSPITAL_COMMUNITY)
Admission: RE | Disposition: A | Discharge status: Home / Self Care | Payer: Self-pay | Source: Home / Self Care | Attending: Orthopaedic Surgery

## 2022-04-26 ENCOUNTER — Other Ambulatory Visit: Payer: Self-pay

## 2022-04-26 ENCOUNTER — Ambulatory Visit (HOSPITAL_COMMUNITY): Payer: Medicare Other | Admitting: Anesthesiology

## 2022-04-26 ENCOUNTER — Encounter (HOSPITAL_COMMUNITY): Payer: Self-pay | Admitting: Orthopaedic Surgery

## 2022-04-26 ENCOUNTER — Observation Stay (HOSPITAL_COMMUNITY)
Admission: RE | Admit: 2022-04-26 | Discharge: 2022-04-27 | Disposition: A | Discharge status: Home / Self Care | Payer: Medicare Other | Attending: Orthopaedic Surgery | Admitting: Orthopaedic Surgery

## 2022-04-26 ENCOUNTER — Observation Stay (HOSPITAL_COMMUNITY): Payer: Medicare Other

## 2022-04-26 ENCOUNTER — Ambulatory Visit (HOSPITAL_BASED_OUTPATIENT_CLINIC_OR_DEPARTMENT_OTHER): Payer: Medicare Other | Admitting: Anesthesiology

## 2022-04-26 DIAGNOSIS — Z79899 Other long term (current) drug therapy: Secondary | ICD-10-CM | POA: Diagnosis not present

## 2022-04-26 DIAGNOSIS — M1711 Unilateral primary osteoarthritis, right knee: Secondary | ICD-10-CM

## 2022-04-26 DIAGNOSIS — Z96642 Presence of left artificial hip joint: Secondary | ICD-10-CM | POA: Diagnosis not present

## 2022-04-26 DIAGNOSIS — I10 Essential (primary) hypertension: Secondary | ICD-10-CM

## 2022-04-26 DIAGNOSIS — Z96651 Presence of right artificial knee joint: Secondary | ICD-10-CM

## 2022-04-26 DIAGNOSIS — E039 Hypothyroidism, unspecified: Secondary | ICD-10-CM | POA: Diagnosis not present

## 2022-04-26 DIAGNOSIS — Z87891 Personal history of nicotine dependence: Secondary | ICD-10-CM | POA: Diagnosis not present

## 2022-04-26 DIAGNOSIS — F419 Anxiety disorder, unspecified: Secondary | ICD-10-CM

## 2022-04-26 DIAGNOSIS — M1712 Unilateral primary osteoarthritis, left knee: Secondary | ICD-10-CM

## 2022-04-26 HISTORY — PX: TOTAL KNEE ARTHROPLASTY: SHX125

## 2022-04-26 SURGERY — ARTHROPLASTY, KNEE, TOTAL
Anesthesia: Monitor Anesthesia Care | Site: Knee | Laterality: Right

## 2022-04-26 MED ORDER — ROSUVASTATIN CALCIUM 5 MG PO TABS: 10.0000 mg | ORAL_TABLET | Freq: Every day | ORAL | Status: DC

## 2022-04-26 MED ORDER — SODIUM CHLORIDE 0.9 % IV SOLN: INTRAVENOUS | Status: DC

## 2022-04-26 MED ORDER — OXYCODONE HCL 5 MG/5ML PO SOLN: 5.0000 mg | Freq: Once | ORAL | Status: DC | PRN

## 2022-04-26 MED ORDER — CHLORHEXIDINE GLUCONATE 0.12 % MT SOLN
15.0000 mL | Freq: Once | OROMUCOSAL | Status: AC
  Administered 2022-04-26: 15 mL via OROMUCOSAL

## 2022-04-26 MED ORDER — HYDROMORPHONE HCL 1 MG/ML IJ SOLN
0.5000 mg | INTRAMUSCULAR | Status: DC | PRN
  Administered 2022-04-26: 0.5 mg via INTRAVENOUS
  Administered 2022-04-26: 1 mg via INTRAVENOUS
  Filled 2022-04-26 (×2): qty 1

## 2022-04-26 MED ORDER — HYDROMORPHONE HCL 2 MG PO TABS
2.0000 mg | ORAL_TABLET | ORAL | Status: DC | PRN
  Administered 2022-04-27: 2 mg via ORAL
  Filled 2022-04-26: qty 1

## 2022-04-26 MED ORDER — LEVOTHYROXINE SODIUM 25 MCG PO TABS
125.0000 ug | ORAL_TABLET | Freq: Every day | ORAL | Status: DC
  Administered 2022-04-27: 125 ug via ORAL
  Filled 2022-04-26: qty 1

## 2022-04-26 MED ORDER — ONDANSETRON HCL 4 MG/2ML IJ SOLN: 4.0000 mg | Freq: Four times a day (QID) | INTRAMUSCULAR | Status: DC | PRN

## 2022-04-26 MED ORDER — MIDAZOLAM HCL 2 MG/2ML IJ SOLN
INTRAMUSCULAR | Status: DC | PRN
  Administered 2022-04-26: 1 mg via INTRAVENOUS
  Administered 2022-04-26 (×2): .5 mg via INTRAVENOUS

## 2022-04-26 MED ORDER — POVIDONE-IODINE 10 % EX SWAB
2.0000 | Freq: Once | CUTANEOUS | Status: AC
  Administered 2022-04-26: 2 via TOPICAL

## 2022-04-26 MED ORDER — CEFAZOLIN SODIUM-DEXTROSE 2-4 GM/100ML-% IV SOLN
INTRAVENOUS | Status: AC
  Filled 2022-04-26: qty 100

## 2022-04-26 MED ORDER — ALUM & MAG HYDROXIDE-SIMETH 200-200-20 MG/5ML PO SUSP: 30.0000 mL | ORAL | Status: DC | PRN

## 2022-04-26 MED ORDER — MIDAZOLAM HCL 2 MG/2ML IJ SOLN
INTRAMUSCULAR | Status: AC
  Filled 2022-04-26: qty 2

## 2022-04-26 MED ORDER — GABAPENTIN 800 MG PO TABS
800.0000 mg | ORAL_TABLET | Freq: Four times a day (QID) | ORAL | Status: DC
  Filled 2022-04-26 (×2): qty 1

## 2022-04-26 MED ORDER — PROPOFOL 10 MG/ML IV BOLUS
INTRAVENOUS | Status: DC | PRN
  Administered 2022-04-26 (×3): 10 mg via INTRAVENOUS

## 2022-04-26 MED ORDER — GABAPENTIN 400 MG PO CAPS
800.0000 mg | ORAL_CAPSULE | Freq: Four times a day (QID) | ORAL | Status: DC
  Administered 2022-04-26 – 2022-04-27 (×3): 800 mg via ORAL
  Filled 2022-04-26 (×3): qty 2

## 2022-04-26 MED ORDER — METHOCARBAMOL 500 MG PO TABS
500.0000 mg | ORAL_TABLET | Freq: Four times a day (QID) | ORAL | Status: DC | PRN
  Administered 2022-04-26 – 2022-04-27 (×2): 500 mg via ORAL
  Filled 2022-04-26 (×2): qty 1

## 2022-04-26 MED ORDER — HYDROMORPHONE HCL 1 MG/ML IJ SOLN: 0.2500 mg | INTRAMUSCULAR | Status: DC | PRN

## 2022-04-26 MED ORDER — LIDOCAINE 2% (20 MG/ML) 5 ML SYRINGE
INTRAMUSCULAR | Status: DC | PRN
  Administered 2022-04-26: 20 mg via INTRAVENOUS

## 2022-04-26 MED ORDER — AMISULPRIDE (ANTIEMETIC) 5 MG/2ML IV SOLN: 10.0000 mg | Freq: Once | INTRAVENOUS | Status: DC | PRN

## 2022-04-26 MED ORDER — METHOCARBAMOL 1000 MG/10ML IJ SOLN: 500.0000 mg | Freq: Four times a day (QID) | INTRAVENOUS | Status: DC | PRN

## 2022-04-26 MED ORDER — MIDAZOLAM HCL 2 MG/2ML IJ SOLN: 2.0000 mg | Freq: Once | INTRAMUSCULAR | Status: AC

## 2022-04-26 MED ORDER — ORAL CARE MOUTH RINSE: 15.0000 mL | Freq: Once | OROMUCOSAL | Status: AC

## 2022-04-26 MED ORDER — DIPHENHYDRAMINE HCL 12.5 MG/5ML PO ELIX: 12.5000 mg | ORAL_SOLUTION | ORAL | Status: DC | PRN

## 2022-04-26 MED ORDER — SODIUM CHLORIDE 0.9 % IR SOLN
Status: DC | PRN
  Administered 2022-04-26: 1000 mL

## 2022-04-26 MED ORDER — FENTANYL CITRATE (PF) 100 MCG/2ML IJ SOLN
INTRAMUSCULAR | Status: AC
  Administered 2022-04-26: 50 ug via INTRAVENOUS
  Filled 2022-04-26: qty 2

## 2022-04-26 MED ORDER — METOCLOPRAMIDE HCL 5 MG PO TABS: 5.0000 mg | ORAL_TABLET | Freq: Three times a day (TID) | ORAL | Status: DC | PRN

## 2022-04-26 MED ORDER — OXYCODONE HCL 5 MG PO TABS: 5.0000 mg | ORAL_TABLET | Freq: Once | ORAL | Status: DC | PRN

## 2022-04-26 MED ORDER — CHLORHEXIDINE GLUCONATE 0.12 % MT SOLN
OROMUCOSAL | Status: AC
  Filled 2022-04-26: qty 15

## 2022-04-26 MED ORDER — DOCUSATE SODIUM 100 MG PO CAPS
100.0000 mg | ORAL_CAPSULE | Freq: Two times a day (BID) | ORAL | Status: DC
  Administered 2022-04-26 – 2022-04-27 (×2): 100 mg via ORAL
  Filled 2022-04-26 (×2): qty 1

## 2022-04-26 MED ORDER — PHENOL 1.4 % MT LIQD: 1.0000 | OROMUCOSAL | Status: DC | PRN

## 2022-04-26 MED ORDER — POLYETHYLENE GLYCOL 3350 17 G PO PACK: 17.0000 g | PACK | Freq: Every day | ORAL | Status: DC | PRN

## 2022-04-26 MED ORDER — ONDANSETRON HCL 4 MG/2ML IJ SOLN
INTRAMUSCULAR | Status: DC | PRN
  Administered 2022-04-26: 4 mg via INTRAVENOUS

## 2022-04-26 MED ORDER — ONDANSETRON HCL 4 MG PO TABS: 4.0000 mg | ORAL_TABLET | Freq: Four times a day (QID) | ORAL | Status: DC | PRN

## 2022-04-26 MED ORDER — METOCLOPRAMIDE HCL 5 MG/ML IJ SOLN: 5.0000 mg | Freq: Three times a day (TID) | INTRAMUSCULAR | Status: DC | PRN

## 2022-04-26 MED ORDER — CEFAZOLIN SODIUM-DEXTROSE 2-4 GM/100ML-% IV SOLN
2.0000 g | INTRAVENOUS | Status: AC
  Administered 2022-04-26: 2 g via INTRAVENOUS

## 2022-04-26 MED ORDER — LIDOCAINE 2% (20 MG/ML) 5 ML SYRINGE
INTRAMUSCULAR | Status: AC
  Filled 2022-04-26: qty 5

## 2022-04-26 MED ORDER — CEFAZOLIN SODIUM-DEXTROSE 1-4 GM/50ML-% IV SOLN
1.0000 g | Freq: Four times a day (QID) | INTRAVENOUS | Status: AC
  Administered 2022-04-26 (×2): 1 g via INTRAVENOUS
  Filled 2022-04-26 (×2): qty 50

## 2022-04-26 MED ORDER — DEXAMETHASONE SODIUM PHOSPHATE 10 MG/ML IJ SOLN
INTRAMUSCULAR | Status: DC | PRN
  Administered 2022-04-26: 10 mg

## 2022-04-26 MED ORDER — AMLODIPINE BESYLATE 5 MG PO TABS
5.0000 mg | ORAL_TABLET | Freq: Every day | ORAL | Status: DC
  Administered 2022-04-27: 5 mg via ORAL
  Filled 2022-04-26: qty 1

## 2022-04-26 MED ORDER — FENTANYL CITRATE (PF) 100 MCG/2ML IJ SOLN: 50.0000 ug | Freq: Once | INTRAMUSCULAR | Status: AC

## 2022-04-26 MED ORDER — TRANEXAMIC ACID-NACL 1000-0.7 MG/100ML-% IV SOLN
INTRAVENOUS | Status: DC | PRN
  Administered 2022-04-26: 1000 mg via INTRAVENOUS

## 2022-04-26 MED ORDER — MORPHINE SULFATE 15 MG PO TABS
30.0000 mg | ORAL_TABLET | Freq: Two times a day (BID) | ORAL | Status: DC | PRN
  Administered 2022-04-26: 30 mg via ORAL
  Filled 2022-04-26: qty 2

## 2022-04-26 MED ORDER — ROPIVACAINE HCL 5 MG/ML IJ SOLN
INTRAMUSCULAR | Status: DC | PRN
  Administered 2022-04-26: 30 mL via PERINEURAL

## 2022-04-26 MED ORDER — PROPOFOL 10 MG/ML IV BOLUS
INTRAVENOUS | Status: AC
  Filled 2022-04-26: qty 20

## 2022-04-26 MED ORDER — LACTATED RINGERS IV SOLN: INTRAVENOUS | Status: DC

## 2022-04-26 MED ORDER — PROPOFOL 500 MG/50ML IV EMUL
INTRAVENOUS | Status: DC | PRN
  Administered 2022-04-26: 5 ug/kg/min via INTRAVENOUS

## 2022-04-26 MED ORDER — MIDAZOLAM HCL 2 MG/2ML IJ SOLN
INTRAMUSCULAR | Status: AC
  Administered 2022-04-26: 2 mg via INTRAVENOUS
  Filled 2022-04-26: qty 2

## 2022-04-26 MED ORDER — ACETAMINOPHEN 500 MG PO TABS
1000.0000 mg | ORAL_TABLET | Freq: Once | ORAL | Status: AC
  Administered 2022-04-26: 1000 mg via ORAL

## 2022-04-26 MED ORDER — OXYCODONE HCL 5 MG PO TABS
5.0000 mg | ORAL_TABLET | ORAL | Status: DC | PRN
  Administered 2022-04-26 – 2022-04-27 (×2): 10 mg via ORAL
  Filled 2022-04-26 (×2): qty 2

## 2022-04-26 MED ORDER — MENTHOL 3 MG MT LOZG: 1.0000 | LOZENGE | OROMUCOSAL | Status: DC | PRN

## 2022-04-26 MED ORDER — BUPIVACAINE IN DEXTROSE 0.75-8.25 % IT SOLN
INTRATHECAL | Status: DC | PRN
  Administered 2022-04-26: 1.6 mL via INTRATHECAL

## 2022-04-26 MED ORDER — 0.9 % SODIUM CHLORIDE (POUR BTL) OPTIME
TOPICAL | Status: DC | PRN
  Administered 2022-04-26: 1000 mL

## 2022-04-26 MED ORDER — ONDANSETRON HCL 4 MG/2ML IJ SOLN: 4.0000 mg | Freq: Once | INTRAMUSCULAR | Status: DC | PRN

## 2022-04-26 MED ORDER — ACETAMINOPHEN 500 MG PO TABS
ORAL_TABLET | ORAL | Status: AC
  Filled 2022-04-26: qty 2

## 2022-04-26 MED ORDER — PANTOPRAZOLE SODIUM 40 MG PO TBEC
40.0000 mg | DELAYED_RELEASE_TABLET | Freq: Every day | ORAL | Status: DC
  Administered 2022-04-26 – 2022-04-27 (×2): 40 mg via ORAL
  Filled 2022-04-26 (×2): qty 1

## 2022-04-26 MED ORDER — LAMOTRIGINE 150 MG PO TABS
150.0000 mg | ORAL_TABLET | Freq: Two times a day (BID) | ORAL | Status: DC
  Administered 2022-04-26 – 2022-04-27 (×2): 150 mg via ORAL
  Filled 2022-04-26 (×2): qty 1

## 2022-04-26 MED ORDER — TRANEXAMIC ACID-NACL 1000-0.7 MG/100ML-% IV SOLN
INTRAVENOUS | Status: AC
  Filled 2022-04-26: qty 100

## 2022-04-26 MED ORDER — ASPIRIN 81 MG PO CHEW
81.0000 mg | CHEWABLE_TABLET | Freq: Two times a day (BID) | ORAL | Status: DC
  Administered 2022-04-26 – 2022-04-27 (×2): 81 mg via ORAL
  Filled 2022-04-26 (×2): qty 1

## 2022-04-26 MED ORDER — AMITRIPTYLINE HCL 25 MG PO TABS
25.0000 mg | ORAL_TABLET | Freq: Every day | ORAL | Status: DC
  Administered 2022-04-26: 25 mg via ORAL
  Filled 2022-04-26: qty 1

## 2022-04-26 MED ORDER — ACETAMINOPHEN 325 MG PO TABS: 325.0000 mg | ORAL_TABLET | Freq: Four times a day (QID) | ORAL | Status: DC | PRN

## 2022-04-26 SURGICAL SUPPLY — 74 items
BAG COUNTER SPONGE SURGICOUNT (BAG) ×2 IMPLANT
BAG SPNG CNTER NS LX DISP (BAG) ×1
BANDAGE ESMARK 6X9 LF (GAUZE/BANDAGES/DRESSINGS) ×2 IMPLANT
BLADE SAG 18X100X1.27 (BLADE) ×2 IMPLANT
BNDG CMPR 9X6 STRL LF SNTH (GAUZE/BANDAGES/DRESSINGS) ×1
BNDG ELASTIC 6X5.8 VLCR STR LF (GAUZE/BANDAGES/DRESSINGS) ×4 IMPLANT
BNDG ESMARK 6X9 LF (GAUZE/BANDAGES/DRESSINGS) ×1
BOWL SMART MIX CTS (DISPOSABLE) ×2 IMPLANT
BSPLAT TIB 5D D CMNT STM RT (Knees) ×1 IMPLANT
CEMENT BONE R 1X40 (Cement) ×4 IMPLANT
COOLER ICEMAN CLASSIC (MISCELLANEOUS) IMPLANT
COVER SURGICAL LIGHT HANDLE (MISCELLANEOUS) ×2 IMPLANT
CUFF TOURN SGL QUICK 34 (TOURNIQUET CUFF) ×1
CUFF TOURN SGL QUICK 42 (TOURNIQUET CUFF) IMPLANT
CUFF TRNQT CYL 34X4.125X (TOURNIQUET CUFF) ×2 IMPLANT
DRAPE EXTREMITY T 121X128X90 (DISPOSABLE) ×2 IMPLANT
DRAPE HALF SHEET 40X57 (DRAPES) ×2 IMPLANT
DRAPE U-SHAPE 47X51 STRL (DRAPES) ×2 IMPLANT
DRSG XEROFORM 1X8 (GAUZE/BANDAGES/DRESSINGS) IMPLANT
DURAPREP 26ML APPLICATOR (WOUND CARE) ×2 IMPLANT
ELECT CAUTERY BLADE 6.4 (BLADE) ×2 IMPLANT
ELECT REM PT RETURN 9FT ADLT (ELECTROSURGICAL) ×1
ELECTRODE REM PT RTRN 9FT ADLT (ELECTROSURGICAL) ×2 IMPLANT
FACESHIELD WRAPAROUND (MASK) ×3 IMPLANT
FACESHIELD WRAPAROUND OR TEAM (MASK) ×4 IMPLANT
FEMUR CMT CR STD SZ 6 RT KNEE (Joint) ×1 IMPLANT
FEMUR CMTD CR STD SZ 6 RT KNEE (Joint) IMPLANT
GAUZE PAD ABD 8X10 STRL (GAUZE/BANDAGES/DRESSINGS) ×2 IMPLANT
GAUZE SPONGE 4X4 12PLY STRL (GAUZE/BANDAGES/DRESSINGS) ×2 IMPLANT
GAUZE XEROFORM 1X8 LF (GAUZE/BANDAGES/DRESSINGS) ×2 IMPLANT
GLOVE BIOGEL PI IND STRL 8 (GLOVE) ×4 IMPLANT
GLOVE ORTHO TXT STRL SZ7.5 (GLOVE) ×2 IMPLANT
GLOVE SURG ORTHO 8.0 STRL STRW (GLOVE) ×2 IMPLANT
GOWN STRL REUS W/ TWL LRG LVL3 (GOWN DISPOSABLE) IMPLANT
GOWN STRL REUS W/ TWL XL LVL3 (GOWN DISPOSABLE) ×4 IMPLANT
GOWN STRL REUS W/TWL LRG LVL3 (GOWN DISPOSABLE)
GOWN STRL REUS W/TWL XL LVL3 (GOWN DISPOSABLE) ×2
HANDPIECE INTERPULSE COAX TIP (DISPOSABLE) ×1
HDLS TROCR DRIL PIN KNEE 75 (PIN) ×1
IMMOBILIZER KNEE 22 UNIV (SOFTGOODS) ×2 IMPLANT
IV NS 1000ML (IV SOLUTION) ×1
IV NS 1000ML BAXH (IV SOLUTION) ×2 IMPLANT
KIT BASIN OR (CUSTOM PROCEDURE TRAY) ×2 IMPLANT
KIT TURNOVER KIT B (KITS) ×2 IMPLANT
LINER ASF PERS 10X6/7 CD RT (Liner) IMPLANT
MANIFOLD NEPTUNE II (INSTRUMENTS) ×2 IMPLANT
NDL 18GX1X1/2 (RX/OR ONLY) (NEEDLE) IMPLANT
NEEDLE 18GX1X1/2 (RX/OR ONLY) (NEEDLE) IMPLANT
NS IRRIG 1000ML POUR BTL (IV SOLUTION) ×2 IMPLANT
PACK TOTAL JOINT (CUSTOM PROCEDURE TRAY) ×2 IMPLANT
PAD ARMBOARD 7.5X6 YLW CONV (MISCELLANEOUS) ×2 IMPLANT
PAD COLD SHLDR WRAP-ON (PAD) IMPLANT
PADDING CAST COTTON 6X4 STRL (CAST SUPPLIES) ×2 IMPLANT
PIN DRILL HDLS TROCAR 75 4PK (PIN) IMPLANT
SCREW FEMALE HEX FIX 25X2.5 (ORTHOPEDIC DISPOSABLE SUPPLIES) IMPLANT
SET HNDPC FAN SPRY TIP SCT (DISPOSABLE) ×2 IMPLANT
SET PAD KNEE POSITIONER (MISCELLANEOUS) ×2 IMPLANT
STAPLER VISISTAT 35W (STAPLE) ×2 IMPLANT
STEM POLY PAT PLY 29M KNEE (Knees) IMPLANT
STEM TIBIA 5 DEG SZ D R KNEE (Knees) IMPLANT
SUCTION FRAZIER HANDLE 10FR (MISCELLANEOUS) ×1
SUCTION TUBE FRAZIER 10FR DISP (MISCELLANEOUS) ×2 IMPLANT
SUT VIC AB 0 CT1 27 (SUTURE) ×1
SUT VIC AB 0 CT1 27XBRD ANBCTR (SUTURE) ×2 IMPLANT
SUT VIC AB 1 CT1 27 (SUTURE) ×2
SUT VIC AB 1 CT1 27XBRD ANBCTR (SUTURE) ×4 IMPLANT
SUT VIC AB 2-0 CT1 27 (SUTURE) ×2
SUT VIC AB 2-0 CT1 TAPERPNT 27 (SUTURE) ×4 IMPLANT
SYR 50ML LL SCALE MARK (SYRINGE) IMPLANT
TIBIA STEM 5 DEG SZ D R KNEE (Knees) ×1 IMPLANT
TOWEL GREEN STERILE (TOWEL DISPOSABLE) ×2 IMPLANT
TOWEL GREEN STERILE FF (TOWEL DISPOSABLE) ×2 IMPLANT
TRAY CATH 16FR W/PLASTIC CATH (SET/KITS/TRAYS/PACK) IMPLANT
WRAP KNEE MAXI GEL POST OP (GAUZE/BANDAGES/DRESSINGS) ×2 IMPLANT

## 2022-04-26 NOTE — Evaluation (Signed)
Physical Therapy Evaluation Patient Details Name: Katie Gaines MRN: [id number] DOB: [birth year] Today's Date: 04/26/2022  History of Present Illness  Pt is 66 yo female s/p R TKA on 04/26/22.  Pt with hx including 10 spinal surgeries, L THA 2022, arthritis, chronic pain syndrome  Clinical Impression  Pt is s/p TKA resulting in the deficits listed below (see PT Problem List). At baseline, pt is independent and active.  Reports has all necessary DME and excellent support system at home.  Evaluation was limited by pain and still with numbness from spinal/nerve block.  Provided increased time for transfers with support for R LE.  R LE and buttock still with some numbness, minimal quad contraction, and unable to SLR so utilized KI for transfers and only stood at EOB briefly due to pain.  Pt with good strength in bil UE and L LE that helps her compensate with transfers.   Pt will benefit from skilled PT to increase their independence and safety with mobility to allow discharge to the venue listed below.          Recommendations for follow up therapy are one component of a multi-disciplinary discharge planning process, led by the attending physician.  Recommendations may be updated based on patient status, additional functional criteria and insurance authorization.  Follow Up Recommendations Follow physician's recommendations for discharge plan and follow up therapies      Assistance Recommended at Discharge Frequent or constant Supervision/Assistance  Patient can return home with the following  A little help with walking and/or transfers;A little help with bathing/dressing/bathroom;Assistance with cooking/housework;Help with stairs or ramp for entrance    Equipment Recommendations None recommended by PT  Recommendations for Other Services       Functional Status Assessment Patient has had a recent decline in their functional status and demonstrates the ability to make significant improvements  in function in a reasonable and predictable amount of time.     Precautions / Restrictions Precautions Precautions: Fall Required Braces or Orthoses: Knee Immobilizer - Right Knee Immobilizer - Right: Discontinue once straight leg raise with < 10 degree lag Restrictions Weight Bearing Restrictions: Yes RLE Weight Bearing: Weight bearing as tolerated      Mobility  Bed Mobility Overal bed mobility: Needs Assistance Bed Mobility: Supine to Sit, Sit to Supine     Supine to sit: Min assist Sit to supine: Min assist   General bed mobility comments: Increased time, max verbal cues, R leg fully supported, cues to scoot all the way forward so that can get R leg to floor without bending knee due to pain    Transfers Overall transfer level: Needs assistance Equipment used: Rolling walker (2 wheels) Transfers: Sit to/from Stand Sit to Stand: Min guard           General transfer comment: Min guard to stand with cues for R LE management; standing fully on L LE no weight on R due to pain; only standing briefly due to pain    Ambulation/Gait               General Gait Details: unable due to pain  Stairs            Wheelchair Mobility    Modified Rankin (Stroke Patients Only)       Balance Overall balance assessment: Needs assistance Sitting-balance support: No upper extremity supported Sitting balance-Leahy Scale: Good     Standing balance support: Bilateral upper extremity supported, Reliant on assistive device for balance Standing balance-Leahy Scale:  Poor Standing balance comment: Steady with RW but only stood briefly due to pain                             Pertinent Vitals/Pain Pain Assessment Pain Assessment: 0-10 Pain Score: 10-Worst pain ever (10/10 sitting ; 8/10 supine) Pain Location: R knee Pain Descriptors / Indicators: Discomfort, Sharp Pain Intervention(s): Limited activity within patient's tolerance, Monitored during session,  Patient requesting pain meds-RN notified, Ice applied    Home Living Family/patient expects to be discharged to:: Private residence Living Arrangements: Spouse/significant other Available Help at Discharge: Family;Available 24 hours/day Type of Home: House Home Access: Stairs to enter Entrance Stairs-Rails: None Entrance Stairs-Number of Steps: 1   Home Layout: One level Home Equipment: Adaptive equipment;Hand held shower head;Grab bars - tub/shower;Cane - single point;Shower seat - built Medical sales representative (2 wheels);BSC/3in1      Prior Function Prior Level of Function : Independent/Modified Independent             Mobility Comments: Reports very active and independent       Hand Dominance   Dominant Hand: Right    Extremity/Trunk Assessment   Upper Extremity Assessment Upper Extremity Assessment: Overall WFL for tasks assessed    Lower Extremity Assessment Lower Extremity Assessment: LLE deficits/detail;RLE deficits/detail RLE Deficits / Details: Limited by pain; ROM: ankle and hip WFL, knee declined flexion due to pain, extension lacking 5 ext; MMT: ankle 3/5 not further tested, knee 1/5 (minimal quad contraction), hip 2/5; attempted SLR with support but unable LLE Deficits / Details: ROM WFL; MMT 5/5    Cervical / Trunk Assessment Cervical / Trunk Assessment: Normal  Communication   Communication: No difficulties  Cognition Arousal/Alertness: Awake/alert Behavior During Therapy: Anxious Overall Cognitive Status: Within Functional Limits for tasks assessed                                          General Comments General comments (skin integrity, edema, etc.): Pt stating "Don't push me too far, I've been through therapy before. I just need to do my goals, not yours." Asked pt her goals and she stated just get better, walk , go home. Discussed that goals first couple weeks is just to start moving, not pushing, and would only do what she could  today. She stated "you already asked me to push my knee down and try to lift leg." Explained purpose of quad set and attempted SLR with assistance to assess nerve block/spinal effects to see if knee immobilizer still needed.  Provided support for R LE and increased time with all transfers.  Also offered opportunity to return to bed at any point to patient.    Exercises     Assessment/Plan    PT Assessment Patient needs continued PT services  PT Problem List Decreased strength;Decreased mobility;Decreased range of motion;Decreased activity tolerance;Decreased balance;Decreased knowledge of use of DME;Pain;Decreased safety awareness       PT Treatment Interventions DME instruction;Therapeutic activities;Modalities;Gait training;Therapeutic exercise;Patient/family education;Stair training;Balance training;Functional mobility training    PT Goals (Current goals can be found in the Care Plan section)  Acute Rehab PT Goals Patient Stated Goal: go home PT Goal Formulation: With patient Time For Goal Achievement: 05/10/22 Potential to Achieve Goals: Good    Frequency 7X/week     Co-evaluation  AM-PAC PT "6 Clicks" Mobility  Outcome Measure Help needed turning from your back to your side while in a flat bed without using bedrails?: A Little Help needed moving from lying on your back to sitting on the side of a flat bed without using bedrails?: A Little Help needed moving to and from a bed to a chair (including a wheelchair)?: A Little Help needed standing up from a chair using your arms (e.g., wheelchair or bedside chair)?: A Little Help needed to walk in hospital room?: Total Help needed climbing 3-5 steps with a railing? : Total 6 Click Score: 14    End of Session Equipment Utilized During Treatment: Gait belt Activity Tolerance: Patient limited by pain Patient left: in bed;with call bell/phone within reach;with family/visitor present;with bed alarm set Nurse  Communication: Mobility status PT Visit Diagnosis: Other abnormalities of gait and mobility (R26.89);Muscle weakness (generalized) (M62.81);Pain Pain - Right/Left: Right Pain - part of body: Knee    Time: 2633-3545 PT Time Calculation (min) (ACUTE ONLY): 38 min   Charges:   PT Evaluation $PT Eval Low Complexity: 1 Low PT Treatments $Therapeutic Activity: 23-37 mins        Abran Richard, PT Acute Rehab [facility] [phone]   Karlton Lemon 04/26/2022, 6:14 PM

## 2022-04-26 NOTE — Anesthesia Procedure Notes (Signed)
Anesthesia Regional Block: Adductor canal block   Pre-Anesthetic Checklist: , timeout performed,  Correct Patient, Correct Site, Correct Laterality,  Correct Procedure, Correct Position, site marked,  Risks and benefits discussed,  Surgical consent,  Pre-op evaluation,  At surgeon's request and post-op pain management  Laterality: Right  Prep: Maximum Sterile Barrier Precautions used, chloraprep       Needles:  Injection technique: Single-shot  Needle Type: Echogenic Stimulator Needle     Needle Length: 9cm  Needle Gauge: 22     Additional Needles:   Procedures:,,,, ultrasound used (permanent image in chart),,    Narrative:  Start time: 04/26/2022 10:10 AM End time: 04/26/2022 10:15 AM Injection made incrementally with aspirations every 5 mL.  Performed by: Personally  Anesthesiologist: Pervis Hocking, DO  Additional Notes: Monitors applied. No increased pain on injection. No increased resistance to injection. Injection made in 5cc increments. Good needle visualization. Patient tolerated procedure well.

## 2022-04-26 NOTE — Op Note (Signed)
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 Ninfa Linden, MD Assistant: Benita Stabile, PA-C  Anesthesia: #1 right lower extremity adductor canal block          #2 spinal Antibiotics: 2 g IV Ancef Tourniquet time under 1 hour EBL: Less than 409 cc Complications: None  Indications: The patient is a 66 year old female well-known to me.  She has had recurrent effusions for some time of the right knee and obvious osteoarthritis.  She has tried and failed conservative treatment for well over a year now.  I have replaced her left hip before.  At this point her right knee needs replacing per our recommendation and per the patient's wishes based on her clinical exam findings and x-ray findings combined with the failure of conservative treatment.  We did explain the risk of acute blood loss anemia, nerve or vessel injury, fracture, infection, DVT, implant failure and soft tissue healing issues.  We talked about the goals being decreased pain, improve mobility and hopefully overall improve quality of life.  Procedure description: After informed consent was obtained and the right knee was marked, anesthesia obtained a right lower extremity adductor canal block in the holding room and then she was brought to the operating room and set up on the operating table where spinal anesthesia was obtained.  She was then laid in supine position on the operating table and a Foley catheter was placed.  A nonsterile tourniquet is placed around her upper right thigh and her right thigh, knee, leg, ankle and foot were prepped and draped with DuraPrep and  sterile drapes.  A timeout was called and she identified as correct patient and the correct right knee.  An Esmarch was then used to wrap out the leg and tourniquet was plated to 3 mm of pressure.  I then made a direct midline incision over the patella and carried this proximally distally.  Dissection was carried down to the knee joint and a medial parapatellar arthrotomy was carried out and a large joint effusion was noted.  There was significant synovitis in the and areas of full-thickness cartilage loss in the medial compartment and the patellofemoral joint.  With the knee in a flexed position we removed osteophytes in all 3 compartments as well as the medial lateral meniscus and the ACL.  We then made our proximal tibia cut correction varus and valgus and a 3 degree slope.  We made this cut to take 2 mm off the high side and once we made this cut we backed it down 2 more millimeters.  We then went to the femur and used the intramedullary cutting guide for a right knee set at 5 degrees externally rotated for 10 mm distal femoral cut.  We made this cut without difficulty.  Then brought the knee back down full extension and his achieve full extension with a 10 mm extension block.  We then back to the femur and put a femoral sizing guide based off the epicondylar axis.  Based off of this we chose a size 6 femur.  We put a 4-in-1 cutting block for a size 6 femur and made her anterior and posterior cuts followed by our chamfer cuts.  We  then went back to the tibia and chose a size D right tibial tray for coverage over the tibial plateau so the rotation of the tibial tubercle and the femur.  We did our keel punch and drill hole off of this.  We then trialed our size D right tibia with our size 6 right CR femur.  We placed a 10 mm medial congruent fixed-bearing poly insert and put her through several cycles of motion and we are pleased with range of motion and stability.  We then made our patella cut and drilled 3 holes  for a size 29 patella button.  We then removed all transportation for the knee and irrigate the knee with normal saline solution using pulsatile lavage.  We dried the knee real well and the mixed our cement.  With the knee in a flexed position we then cemented our Biomet/Zimmer persona tibial tray for right knee size D followed by a size 6 right femur which was a CR standard femur.  We placed our 10 mm medial congruent polyethylene insert and cemented our size 29 patella button.  We then held the knee fully extended and compressed while the cement hardened.  Once it hardened with the tourniquet down and hemostasis was obtained electrocautery.  The arthrotomy was then closed with interrupted #1 Vicryl suture followed by 0 Vicryl close the deep tissue and 2-0 Vicryl close the subcutaneous tissue.  The skin was closed with staples.  Well-padded sterile dressings applied.  She was taken recovery in stable addition with all final counts being correct and no complications noted.  Benita Stabile, PA-C did assisted in entire case from beginning to end and his assistance was crucial and medically necessary for soft tissue management and retraction, helping guide implant placement and a layered closure of the wound.

## 2022-04-26 NOTE — H&P (Signed)
TOTAL KNEE ADMISSION H&P  Patient is being admitted for right total knee arthroplasty.  Subjective:  Chief Complaint:right knee pain.  HPI: Katie Gaines, 66 y.o. female, has a history of pain and functional disability in the right knee due to arthritis and has failed non-surgical conservative treatments for greater than 12 weeks to includeNSAID's and/or analgesics, corticosteriod injections, viscosupplementation injections, flexibility and strengthening excercises, and activity modification.  Onset of symptoms was gradual, starting 2 years ago with gradually worsening course since that time. The patient noted no past surgery on the right knee(s).  Patient currently rates pain in the right knee(s) at 10 out of 10 with activity. Patient has night pain, worsening of pain with activity and weight bearing, pain that interferes with activities of daily living, pain with passive range of motion, crepitus, and joint swelling.  Patient has evidence of subchondral sclerosis, periarticular osteophytes, and joint space narrowing by imaging studies. There is no active infection.  Patient Active Problem List   Diagnosis Date Noted   Arthritis of knee, right 04/26/2022   Status post left hip replacement 06/25/2021   Avascular necrosis of hip, left ([place]) 06/24/2021   Fusion of spine of lumbar region 03/17/2021   Common peroneal neuropathy, left 06/02/2020   Dysesthesia 03/16/2020   Numbness 03/16/2020   Visual changes 03/16/2020   History of fusion of cervical spine 03/16/2020   History of fusion of lumbar spine 03/16/2020   Fatigue 11/29/2019   DDD (degenerative disc disease), lumbar 08/26/2019   Gastroesophageal reflux disease 06/17/2019   Vitamin D deficiency 06/17/2019   Irritable bowel syndrome with diarrhea 03/06/2019   Closed fracture of distal end of radius 10/04/2018   Injury of right wrist 09/13/2018   Low back pain 07/15/2016   Pain syndrome, chronic 09/09/2012   Postlaminectomy  syndrome, not elsewhere classified 09/09/2012   Anemia 05/30/2012   Essential hypertension 02/13/2012   Chronic gastrointestinal bleeding 01/04/2012   Hypothyroidism 01/04/2012   Obstruction of bile duct 07/19/2011   Abnormal radiographic examination 07/19/2011   IRON DEFICIENCY 09/10/2009   Past Medical History:  Diagnosis Date   Anemia    Anxiety disorder    Cancer ([place])    Common bile duct stone    Graves disease    History of kidney stones    Hypertension    Hypothyroidism    Iron deficiency    Lymphedema of leg    bilateral    Past Surgical History:  Procedure Laterality Date   ANTERIOR LAT LUMBAR FUSION N/A 08/26/2019   Procedure: ANTERIOR LATERAL LUMBAR INTERBODY FUSION, LUMBAR ONE LUMBAR TWO;  Surgeon: Kary Kos, MD;  Location: [facility];  Service: Neurosurgery;  Laterality: N/A;  anterolateral   APPENDECTOMY     BACK SURGERY  1987-2007   X 10 Surgery   CHOLECYSTECTOMY     ENTEROSCOPY  11/10/2011   Procedure: ENTEROSCOPY;  Surgeon: Inda Castle, MD;  Location: [facility];  Service: Endoscopy;  Laterality: N/A;   ERCP  07/19/2011   Procedure: ENDOSCOPIC RETROGRADE CHOLANGIOPANCREATOGRAPHY (ERCP);  Surgeon: Inda Castle, MD;  Location: [facility];  Service: Endoscopy;  Laterality: N/A;   ESOPHAGOGASTRODUODENOSCOPY  09/2018   FRACTURE SURGERY Left 2018   left arm fracture   HOT HEMOSTASIS  11/10/2011   Procedure: HOT HEMOSTASIS (ARGON PLASMA COAGULATION/BICAP);  Surgeon: Inda Castle, MD;  Location: [facility];  Service: Endoscopy;  Laterality: N/A;   TOTAL HIP ARTHROPLASTY Left 06/25/2021   Procedure: LEFT TOTAL HIP ARTHROPLASTY ANTERIOR APPROACH;  Surgeon:  Mcarthur Rossetti, MD;  Location: [facility];  Service: Orthopedics;  Laterality: Left;    Current Facility-Administered Medications  Medication Dose Route Frequency Provider Last Rate Last Admin   acetaminophen (TYLENOL) 500 MG tablet            ceFAZolin (ANCEF) 2-4 GM/100ML-% IVPB             ceFAZolin (ANCEF) IVPB 2g/100 mL premix  2 g Intravenous On Call to OR Mcarthur Rossetti, MD       chlorhexidine (PERIDEX) 0.12 % solution            lactated ringers infusion   Intravenous Continuous Effie Berkshire, MD 10 mL/hr at 04/26/22 1013 New Bag at 04/26/22 1013   Facility-Administered Medications Ordered in Other Encounters  Medication Dose Route Frequency Provider Last Rate Last Admin   dexamethasone (DECADRON) injection   Infiltration Anesthesia Intra-op Pervis Hocking, DO   10 mg at 04/26/22 1015   ropivacaine (PF) 5 mg/mL (0.5%) (NAROPIN) injection   Peri-NEURAL Anesthesia Intra-op Pervis Hocking, DO   30 mL at 04/26/22 1015   No Known Allergies  Social History   Tobacco Use   Smoking status: Former    Packs/day: 0.50    Years: 45.00    Total pack years: 22.50    Types: Cigarettes    Quit date: 09/08/2020    Years since quitting: 1.6   Smokeless tobacco: Never  Substance Use Topics   Alcohol use: Yes    Alcohol/week: 0.0 standard drinks of alcohol    Comment: 1 drink per month     Family History  Problem Relation Age of Onset   Colon polyps Father    Diabetes Father    Brain cancer Mother    Colon cancer Neg Hx    Malignant hyperthermia Neg Hx      Review of Systems  Objective:  Physical Exam Vitals reviewed.  Constitutional:      Appearance: Normal appearance.  HENT:     Head: Normocephalic and atraumatic.  Eyes:     Extraocular Movements: Extraocular movements intact.     Pupils: Pupils are equal, round, and reactive to light.  Cardiovascular:     Rate and Rhythm: Normal rate and regular rhythm.  Pulmonary:     Effort: Pulmonary effort is normal.     Breath sounds: Normal breath sounds.  Abdominal:     Palpations: Abdomen is soft.  Musculoskeletal:     Cervical back: Normal range of motion and neck supple.     Right knee: Effusion, bony tenderness and crepitus present. Decreased range of motion. Tenderness present over the  medial joint line and lateral joint line. Abnormal alignment and abnormal meniscus.  Neurological:     Mental Status: She is alert and oriented to person, place, and time.  Psychiatric:        Behavior: Behavior normal.     Vital signs in last 24 hours: Temp:  [98.3 F (36.8 C)] 98.3 F (36.8 C) (09/19 0948) Pulse Rate:  [53-68] 55 (09/19 1105) Resp:  [9-18] 18 (09/19 1105) BP: (93-136)/(59-77) 102/64 (09/19 1100) SpO2:  [95 %-100 %] 98 % (09/19 1105) Weight:  [68.6 kg] 68.6 kg (09/19 0948)  Labs:   Estimated body mass index is 24.42 kg/m as calculated from the following:   Height as of this encounter: '5\' 6"'$  (1.676 m).   Weight as of this encounter: 68.6 kg.   Imaging Review Plain radiographs demonstrate severe degenerative joint disease  of the right knee(s). The overall alignment ismild varus. The bone quality appears to be good for age and reported activity level.      Assessment/Plan:  End stage arthritis, right knee   The patient history, physical examination, clinical judgment of the provider and imaging studies are consistent with end stage degenerative joint disease of the right knee(s) and total knee arthroplasty is deemed medically necessary. The treatment options including medical management, injection therapy arthroscopy and arthroplasty were discussed at length. The risks and benefits of total knee arthroplasty were presented and reviewed. The risks due to aseptic loosening, infection, stiffness, patella tracking problems, thromboembolic complications and other imponderables were discussed. The patient acknowledged the explanation, agreed to proceed with the plan and consent was signed. Patient is being admitted for inpatient treatment for surgery, pain control, PT, OT, prophylactic antibiotics, VTE prophylaxis, progressive ambulation and ADL's and discharge planning. The patient is planning to be discharged home with home health services

## 2022-04-26 NOTE — Progress Notes (Signed)
No need to repeat CBC today per Dr. Doroteo Glassman.

## 2022-04-26 NOTE — Anesthesia Procedure Notes (Signed)
Spinal  Patient location during procedure: OR Start time: 04/26/2022 12:27 PM End time: 04/26/2022 12:33 PM Reason for block: surgical anesthesia Staffing Performed: anesthesiologist  Anesthesiologist: Pervis Hocking, DO Performed by: Pervis Hocking, DO Authorized by: Pervis Hocking, DO   Preanesthetic Checklist Completed: patient identified, IV checked, risks and benefits discussed, surgical consent, monitors and equipment checked, pre-op evaluation and timeout performed Spinal Block Patient position: sitting Prep: DuraPrep and site prepped and draped Patient monitoring: cardiac monitor, continuous pulse ox and blood pressure Approach: midline Location: L3-4 Injection technique: single-shot Needle Needle type: Pencan  Needle gauge: 24 G Needle length: 9 cm Assessment Sensory level: T6 Events: CSF return Additional Notes Functioning IV was confirmed and monitors were applied. Sterile prep and drape, including hand hygiene and sterile gloves were used. The patient was positioned and the spine was prepped. The skin was anesthetized with lidocaine.  Free flow of clear CSF was obtained prior to injecting local anesthetic into the CSF.  The spinal needle aspirated freely following injection.  The needle was carefully withdrawn.  The patient tolerated the procedure well.

## 2022-04-26 NOTE — Transfer of Care (Signed)
Immediate Anesthesia Transfer of Care Note  Patient: Katie Gaines  Procedure(s) Performed: RIGHT TOTAL KNEE ARTHROPLASTY (Right: Knee)  Patient Location: PACU  Anesthesia Type:MAC and Spinal  Level of Consciousness: awake, alert , and oriented  Airway & Oxygen Therapy: Patient Spontanous Breathing  Post-op Assessment: Report given to RN and Post -op Vital signs reviewed and stable  Post vital signs: Reviewed and stable  Last Vitals:  Vitals Value Taken Time  BP 128/69 04/26/22 1430  Temp 36.3 C 04/26/22 1420  Pulse 59 04/26/22 1434  Resp 11 04/26/22 1434  SpO2 95 % 04/26/22 1434  Vitals shown include unvalidated device data.  Last Pain:  Vitals:   04/26/22 1420  TempSrc:   PainSc: 0-No pain      Patients Stated Pain Goal: 0 (54/27/06 2376)  Complications: No notable events documented.

## 2022-04-26 NOTE — Anesthesia Postprocedure Evaluation (Signed)
Anesthesia Post Note  Patient: Katie Gaines  Procedure(s) Performed: RIGHT TOTAL KNEE ARTHROPLASTY (Right: Knee)     Patient location during evaluation: PACU Anesthesia Type: Regional, MAC and Spinal Level of consciousness: awake and alert and oriented Pain management: pain level controlled Vital Signs Assessment: post-procedure vital signs reviewed and stable Respiratory status: spontaneous breathing, nonlabored ventilation and respiratory function stable Cardiovascular status: blood pressure returned to baseline and stable Postop Assessment: no headache, no backache, spinal receding and patient able to bend at knees Anesthetic complications: no   No notable events documented.  Last Vitals:  Vitals:   04/26/22 1435 04/26/22 1450  BP: 128/69 110/68  Pulse: 63 61  Resp: 12 16  Temp:    SpO2: 93% 94%    Last Pain:  Vitals:   04/26/22 1450  TempSrc:   PainSc: 0-No pain                 Pervis Hocking

## 2022-04-26 NOTE — Anesthesia Preprocedure Evaluation (Addendum)
Anesthesia Evaluation  Patient identified by MRN, date of birth, ID band Patient awake    Reviewed: Allergy & Precautions, NPO status , Patient's Chart, lab work & pertinent test results  Airway Mallampati: III  TM Distance: >3 FB Neck ROM: Full    Dental  (+) Poor Dentition, Chipped, Dental Advisory Given   Pulmonary former smoker Quit smoking 2022, 23 pack year history, no inhalers   Pulmonary exam normal breath sounds clear to auscultation       Cardiovascular hypertension, Pt. on medications Normal cardiovascular exam Rhythm:Regular Rate:Normal     Neuro/Psych  PSYCHIATRIC DISORDERS Anxiety     negative neurological ROS     GI/Hepatic Neg liver ROS,GERD  Controlled and Medicated,,  Endo/Other  Hypothyroidism    Renal/GU negative Renal ROS  negative genitourinary   Musculoskeletal  (+) Arthritis , Osteoarthritis,  Per pt has had >10 back surgeries, most recent 2022- but successfully had THR last year under spinal   Abdominal   Peds  Hematology Hb 11, plt 251 on labs at OSH 04/15/22   Anesthesia Other Findings   Reproductive/Obstetrics negative OB ROS                             Anesthesia Physical Anesthesia Plan  ASA: 3  Anesthesia Plan: Spinal, Regional and MAC   Post-op Pain Management: Regional block* and Tylenol PO (pre-op)*   Induction:   PONV Risk Score and Plan: 2 and Propofol infusion and TIVA  Airway Management Planned: Natural Airway and Nasal Cannula  Additional Equipment: None  Intra-op Plan:   Post-operative Plan:   Informed Consent: I have reviewed the patients History and Physical, chart, labs and discussed the procedure including the risks, benefits and alternatives for the proposed anesthesia with the patient or authorized representative who has indicated his/her understanding and acceptance.       Plan Discussed with: CRNA  Anesthesia Plan Comments:          Anesthesia Quick Evaluation

## 2022-04-27 ENCOUNTER — Encounter: Payer: Self-pay | Admitting: Orthopaedic Surgery

## 2022-04-27 ENCOUNTER — Encounter (HOSPITAL_COMMUNITY): Payer: Self-pay | Admitting: Orthopaedic Surgery

## 2022-04-27 DIAGNOSIS — M1711 Unilateral primary osteoarthritis, right knee: Secondary | ICD-10-CM | POA: Diagnosis not present

## 2022-04-27 LAB — CBC
HCT: 29.3 % — ABNORMAL LOW (ref 36.0–46.0)
Hemoglobin: 8.5 g/dL — ABNORMAL LOW (ref 12.0–15.0)
MCH: 22.2 pg — ABNORMAL LOW (ref 26.0–34.0)
MCHC: 29 g/dL — ABNORMAL LOW (ref 30.0–36.0)
MCV: 76.5 fL — ABNORMAL LOW (ref 80.0–100.0)
Platelets: 263 10*3/uL (ref 150–400)
RBC: 3.83 MIL/uL — ABNORMAL LOW (ref 3.87–5.11)
RDW: 20.8 % — ABNORMAL HIGH (ref 11.5–15.5)
WBC: 11.2 10*3/uL — ABNORMAL HIGH (ref 4.0–10.5)
nRBC: 0 % (ref 0.0–0.2)

## 2022-04-27 LAB — BASIC METABOLIC PANEL
Anion gap: 9 (ref 5–15)
BUN: 13 mg/dL (ref 8–23)
CO2: 23 mmol/L (ref 22–32)
Calcium: 8.6 mg/dL — ABNORMAL LOW (ref 8.9–10.3)
Chloride: 108 mmol/L (ref 98–111)
Creatinine, Ser: 0.81 mg/dL (ref 0.44–1.00)
GFR, Estimated: >60 mL/min (ref >60)
Glucose, Bld: 147 mg/dL — ABNORMAL HIGH (ref 70–99)
Potassium: 3.6 mmol/L (ref 3.5–5.1)
Sodium: 140 mmol/L (ref 135–145)

## 2022-04-27 MED ORDER — METHOCARBAMOL 500 MG PO TABS: 500.0000 mg | ORAL_TABLET | Freq: Four times a day (QID) | ORAL | 1 refills | Status: DC | PRN

## 2022-04-27 MED ORDER — ASPIRIN 81 MG PO CHEW: 81.0000 mg | CHEWABLE_TABLET | Freq: Two times a day (BID) | ORAL | 0 refills | Status: DC

## 2022-04-27 MED ORDER — OXYCODONE HCL 5 MG PO TABS: 5.0000 mg | ORAL_TABLET | ORAL | 0 refills | Status: DC | PRN

## 2022-04-27 NOTE — Plan of Care (Signed)
  Problem: Health Behavior/Discharge Planning: Goal: Ability to manage health-related needs will improve Outcome: Completed/Met   Problem: Education: Goal: Knowledge of the prescribed therapeutic regimen will improve Outcome: Completed/Met Goal: Individualized Educational Video(s) Outcome: Completed/Met   Problem: Activity: Goal: Ability to avoid complications of mobility impairment will improve Outcome: Completed/Met Goal: Range of joint motion will improve Outcome: Completed/Met   Problem: Clinical Measurements: Goal: Postoperative complications will be avoided or minimized Outcome: Completed/Met   Problem: Pain Management: Goal: Pain level will decrease with appropriate interventions Outcome: Completed/Met   Problem: Skin Integrity: Goal: Will show signs of wound healing Outcome: Completed/Met

## 2022-04-27 NOTE — Discharge Summary (Signed)
Patient ID: Katie Gaines MRN: [id number] DOB/AGE: [birth year] 66 y.o.  Admit date: 04/26/2022 Discharge date: 04/27/2022  Admission Diagnoses:  Principal Problem:   Arthritis of knee, right Active Problems:   Status post total right knee replacement   Discharge Diagnoses:  Same  Past Medical History:  Diagnosis Date   Anemia    Anxiety disorder    Cancer ([place])    Common bile duct stone    Graves disease    History of kidney stones    Hypertension    Hypothyroidism    Iron deficiency    Lymphedema of leg    bilateral    Surgeries: Procedure(s): RIGHT TOTAL KNEE ARTHROPLASTY on 04/26/2022   Consultants:   Discharged Condition: Improved  Hospital Course: Katie Gaines is an 66 y.o. female who was admitted 04/26/2022 for operative treatment ofArthritis of knee, right. Patient has severe unremitting pain that affects sleep, daily activities, and work/hobbies. After pre-op clearance the patient was taken to the operating room on 04/26/2022 and underwent  Procedure(s): RIGHT TOTAL KNEE ARTHROPLASTY.    Patient was given perioperative antibiotics:  Anti-infectives (From admission, onward)    Start     Dose/Rate Route Frequency Ordered Stop   04/26/22 1730  ceFAZolin (ANCEF) IVPB 1 g/50 mL premix        1 g 100 mL/hr over 30 Minutes Intravenous Every 6 hours 04/26/22 1636 04/26/22 2353   04/26/22 1017  ceFAZolin (ANCEF) 2-4 GM/100ML-% IVPB       Note to Pharmacy: Wendall Mola M: cabinet override      04/26/22 1017 04/26/22 1259   04/26/22 1015  ceFAZolin (ANCEF) IVPB 2g/100 mL premix        2 g 200 mL/hr over 30 Minutes Intravenous On call to O.R. 04/26/22 1007 04/26/22 1305        Patient was given sequential compression devices, early ambulation, and chemoprophylaxis to prevent DVT.  Patient benefited maximally from hospital stay and there were no complications.    Recent vital signs: Patient Vitals for the past 24 hrs:  BP Temp Temp src Pulse Resp SpO2  Height Weight  04/27/22 0343 (!) 141/77 98.4 F (36.9 C) Oral 74 18 96 % -- --  04/26/22 2259 139/75 98.1 F (36.7 C) Oral 71 18 93 % -- --  04/26/22 1946 128/68 98.5 F (36.9 C) Oral 73 18 97 % -- --  04/26/22 1629 129/76 -- -- 66 18 100 % -- --  04/26/22 1555 118/67 -- -- 60 10 98 % -- --  04/26/22 1535 121/75 -- -- 61 10 95 % -- --  04/26/22 1520 136/71 (!) 97.4 F (36.3 C) -- 61 17 93 % -- --  04/26/22 1505 122/69 -- -- 71 20 99 % -- --  04/26/22 1450 110/68 -- -- 61 16 94 % -- --  04/26/22 1435 128/69 -- -- 63 12 93 % -- --  04/26/22 1420 105/79 (!) 97.4 F (36.3 C) -- 60 (!) 8 97 % -- --  04/26/22 1105 -- -- -- (!) 55 18 98 % -- --  04/26/22 1100 102/64 -- -- (!) 53 11 100 % -- --  04/26/22 1055 106/62 -- -- (!) 59 16 99 % -- --  04/26/22 1050 93/62 -- -- 61 16 99 % -- --  04/26/22 1045 (!) 98/59 -- -- 61 12 98 % -- --  04/26/22 1040 108/69 -- -- 60 (!) 9 98 % -- --  04/26/22 1035 114/72 -- -- 66  17 99 % -- --  04/26/22 0948 136/77 98.3 F (36.8 C) Oral 68 17 95 % '5\' 6"'$  (1.676 m) 68.6 kg     Recent laboratory studies: No results for input(s): "WBC", "HGB", "HCT", "PLT", "NA", "K", "CL", "CO2", "BUN", "CREATININE", "GLUCOSE", "INR", "CALCIUM" in the last 72 hours.  Invalid input(s): "PT", "2"   Discharge Medications:   Allergies as of 04/27/2022   No Known Allergies      Medication List     TAKE these medications    amitriptyline 25 MG tablet Commonly known as: ELAVIL Take 25 mg by mouth at bedtime.   amLODipine 5 MG tablet Commonly known as: NORVASC Take 5 mg by mouth daily.   aspirin 81 MG chewable tablet Chew 1 tablet (81 mg total) by mouth 2 (two) times daily.   aspirin-acetaminophen-caffeine 863-817-71 MG tablet Commonly known as: EXCEDRIN MIGRAINE Take 1-2 tablets by mouth every 6 (six) hours as needed for headache or migraine.   ergocalciferol 1.25 MG (50000 UT) capsule Commonly known as: VITAMIN D2 Take 50,000 Units by mouth every 'Sunday.    gabapentin 800 MG tablet Commonly known as: NEURONTIN Take 800 mg by mouth 4 (four) times daily.   lamoTRIgine 150 MG tablet Commonly known as: LAMICTAL Take 1 tablet (150 mg total) by mouth 2 (two) times daily. Please call us at [phone] to make follow up visit for further refills.   methocarbamol 500 MG tablet Commonly known as: ROBAXIN Take 1 tablet (500 mg total) by mouth every 6 (six) hours as needed for muscle spasms.   morphine 30 MG tablet Commonly known as: MSIR Take 30 mg by mouth 2 (two) times daily as needed.   nitroGLYCERIN 0.4 MG SL tablet Commonly known as: NITROSTAT Place 0.4 mg under the tongue every 5 (five) minutes as needed (esophageal spasms).   omeprazole 40 MG capsule Commonly known as: PRILOSEC Take 80 mg by mouth daily.   oxyCODONE 5 MG immediate release tablet Commonly known as: Oxy IR/ROXICODONE Take 1-2 tablets (5-10 mg total) by mouth every 4 (four) hours as needed for moderate pain (pain score 4-6).   rosuvastatin 10 MG tablet Commonly known as: CRESTOR Take 10 mg by mouth at bedtime.   Synthroid 125 MCG tablet Generic drug: levothyroxine Take 125 mcg by mouth daily before breakfast.               Durable Medical Equipment  (From admission, onward)           Start     Ordered   04/26/22 1637  DME 3 n 1  Once        04/26/22 1636   04/26/22 1637  DME Walker rolling  Once       Question Answer Comment  Walker: With 5 Inch Wheels   Patient needs a walker to treat with the following condition Status post total right knee replacement      09'$ /19/23 1636            Diagnostic Studies: DG Knee Right Port  Result Date: 04/26/2022 CLINICAL DATA:  Status post total right knee arthroplasty. EXAM: PORTABLE RIGHT KNEE - 1-2 VIEW COMPARISON:  Right knee radiographs 12/06/2021 FINDINGS: Interval total right knee arthroplasty. No perihardware lucency is seen to indicate hardware failure or loosening. Postoperative intra-articular  air and mild fluid and anterior subcutaneous air and soft tissue swelling. Anterior surgical skin staples. No acute fracture or dislocation. IMPRESSION: Status post total right knee arthroplasty without evidence of hardware failure.  Electronically Signed   By: Yvonne Kendall M.D.   On: 04/26/2022 15:29    Disposition: Discharge disposition: 01-Home or [facility]     Mcarthur Rossetti, MD Follow up in 2 week(s).   Specialty: Orthopedic Surgery Contact information: [place] [address] [address], [place] Follow up.   Specialty: [place] Why: The home health agency will contact you for the first home visit Contact information: [place] [address] [phone]                  Signed: Mcarthur Rossetti 04/27/2022, 8:08 AM

## 2022-04-27 NOTE — Progress Notes (Signed)
Physical Therapy Treatment Patient Details Name: Katie Gaines MRN: [id number] DOB: 11-25-1955 Today's Date: 04/27/2022   History of Present Illness Pt is 66 yo female s/p R TKA on 04/26/22. PMH includes 10 spinal surgeries, L THA (2022), arthritis, chronic pain syndrome.   PT Comments    Pt progressing with mobility. Pt able to transfer and ambulate with RW at supervision-level. Pt focused on wanting to d/c and self-limiting at times with her desire for education from this therapist. Pt agreeable to trial ambulation with KI on and off, noted improvement in quad activation but still with lag, therefore KI re-donned; pt also performing some therex and agreeable to HEP, but ultimately did not want to perform all exercises. Pt reports no further questions or concerns, preparing for d/c home this morning. If to remain admitted, will continue to follow acutely to address established goals.    Recommendations for follow up therapy are one component of a multi-disciplinary discharge planning process, led by the attending physician.  Recommendations may be updated based on patient status, additional functional criteria and insurance authorization.  Follow Up Recommendations  Follow physician's recommendations for discharge plan and follow up therapies     Assistance Recommended at Discharge Intermittent Supervision/Assistance  Patient can return home with the following A little help with walking and/or transfers;A little help with bathing/dressing/bathroom;Assistance with cooking/housework;Help with stairs or ramp for entrance   Equipment Recommendations  None recommended by PT    Recommendations for Other Services       Precautions / Restrictions Precautions Precautions: Fall;Knee Required Braces or Orthoses: Knee Immobilizer - Right Knee Immobilizer - Right: Discontinue once straight leg raise with < 10 degree lag Restrictions Weight Bearing Restrictions: Yes RLE Weight Bearing: Weight  bearing as tolerated     Mobility  Bed Mobility Overal bed mobility: Modified Independent             General bed mobility comments: use of BUEs to manage RLE in/out of bed multiple times during session    Transfers Overall transfer level: Modified independent Equipment used: Rolling walker (2 wheels) Transfers: Sit to/from Stand             General transfer comment: preference to pull up on RW to stand; >5x sit<>stands from EOB during session mod indep    Ambulation/Gait Ambulation/Gait assistance: Supervision Gait Distance (Feet): 100 Feet Assistive device: Rolling walker (2 wheels) Gait Pattern/deviations: Step-through pattern, Decreased stride length Gait velocity: Decreased     General Gait Details: regarding gait training, pt reports, "I'm not going to walk as far as you want me to... I'm going to do what I feel is best..." - pt moving well with RW without assist, initially hopping on LLE requirnig cues for increased RLE WBAT and heel-to-toe gait pattern while KI donned. pt reports not feeling R calf muscle activating, additional gait trial without KI with improving knee instability; R KI donned at end of session   Stairs Stairs:  (pt declined stair training; educ on technique to reduce R knee pain)           Wheelchair Mobility    Modified Rankin (Stroke Patients Only)       Balance Overall balance assessment: Needs assistance Sitting-balance support: No upper extremity supported Sitting balance-Leahy Scale: Good Sitting balance - Comments: able to prop RLE on bed to doff KI without assist   Standing balance support: Bilateral upper extremity supported, Single extremity supported Standing balance-Leahy Scale: Fair Standing balance comment: can static stand without  UE support; preference for RW                            Cognition Arousal/Alertness: Awake/alert Behavior During Therapy: Impulsive Overall Cognitive Status: Within  Functional Limits for tasks assessed                                 General Comments: WFL for simple tasks; pt argumentative at times with instructions but ultimately cooperative during session, focused on being ready to d/c requiring some redirection in conversation and task. at end of session, pt states, "I don't hate you"        Exercises General Exercises - Lower Extremity Quad Sets: AROM, Right, Seated Long Arc Quad: AROM, Right, Seated (partial range) Heel Slides: AAROM, Right, Seated (use of LLE to increase R knee flexion) Straight Leg Raises: AROM, Right (partial range, still unable to complete full range though noting improving quad activation)    General Comments General comments (skin integrity, edema, etc.): pt requesting therex, practicing a few exercises with instruction, HEP provided with further exercises but pt declined to practice after reading through and reports understanding - pt wanting to go home asap. reviewed educ as pt allowed, pt reports no further questions or concerns      Pertinent Vitals/Pain Pain Assessment Pain Assessment: Faces Faces Pain Scale: Hurts even more Pain Location: R knee Pain Descriptors / Indicators: Discomfort, Sharp Pain Intervention(s): Monitored during session, Limited activity within patient's tolerance, Patient requesting pain meds-RN notified    Home Living Family/patient expects to be discharged to:: Private residence Living Arrangements: Spouse/significant other Available Help at Discharge: Family;Available 24 hours/day Type of Home: House Home Access: Stairs to enter Entrance Stairs-Rails: None Entrance Stairs-Number of Steps: 1   Home Layout: One level Home Equipment: Adaptive equipment;Hand held shower head;Grab bars - tub/shower;Cane - single point;Shower seat - built Medical sales representative (2 wheels);BSC/3in1 Additional Comments: husband 24/7    Prior Function            PT Goals (current goals can now  be found in the care plan section) Progress towards PT goals: Progressing toward goals    Frequency    7X/week      PT Plan Current plan remains appropriate    Co-evaluation              AM-PAC PT "6 Clicks" Mobility   Outcome Measure  Help needed turning from your back to your side while in a flat bed without using bedrails?: None Help needed moving from lying on your back to sitting on the side of a flat bed without using bedrails?: None Help needed moving to and from a bed to a chair (including a wheelchair)?: None Help needed standing up from a chair using your arms (e.g., wheelchair or bedside chair)?: None Help needed to walk in hospital room?: A Little Help needed climbing 3-5 steps with a railing? : A Little 6 Click Score: 22    End of Session   Activity Tolerance: Patient tolerated treatment well;Other (comment) (limited by decreased attention, focusing on wanting to leave) Patient left: in bed;with call bell/phone within reach;with nursing/sitter in room Nurse Communication: Mobility status PT Visit Diagnosis: Other abnormalities of gait and mobility (R26.89);Muscle weakness (generalized) (M62.81);Pain Pain - Right/Left: Right Pain - part of body: Knee     Time: 9622-2979 PT Time Calculation (min) (ACUTE ONLY): 22 min  Charges:  $Therapeutic Activity: 8-22 mins                     Mabeline Caras, PT, DPT Acute Rehabilitation Services  Personal: [facility] Office: [place] 04/27/2022, 11:09 AM

## 2022-04-27 NOTE — Discharge Instructions (Signed)

## 2022-04-27 NOTE — Progress Notes (Signed)
Patient alert and oriented, dressing dry and intact, all question answer d/c instruction given.

## 2022-04-27 NOTE — Progress Notes (Signed)
Subjective: 1 Day Post-Op Procedure(s) (LRB): RIGHT TOTAL KNEE ARTHROPLASTY (Right) Patient reports pain as moderate.    Objective: Vital signs in last 24 hours: Temp:  [97.4 F (36.3 C)-98.5 F (36.9 C)] 98.4 F (36.9 C) (09/20 0343) Pulse Rate:  [53-74] 74 (09/20 0343) Resp:  [8-20] 18 (09/20 0343) BP: (93-141)/(59-79) 141/77 (09/20 0343) SpO2:  [93 %-100 %] 96 % (09/20 0343) Weight:  [68.6 kg] 68.6 kg (09/19 0948)  Intake/Output from previous day: 09/19 0701 - 09/20 0700 In: 1300 [I.V.:1200; IV Piggyback:100] Out: 1235 [Urine:1210; Blood:25] Intake/Output this shift: No intake/output data recorded.  No results for input(s): "HGB" in the last 72 hours. No results for input(s): "WBC", "RBC", "HCT", "PLT" in the last 72 hours. No results for input(s): "NA", "K", "CL", "CO2", "BUN", "CREATININE", "GLUCOSE", "CALCIUM" in the last 72 hours. No results for input(s): "LABPT", "INR" in the last 72 hours.  Sensation intact distally Intact pulses distally Dorsiflexion/Plantar flexion intact Incision: dressing C/D/I No cellulitis present Compartment soft   Assessment/Plan: 1 Day Post-Op Procedure(s) (LRB): RIGHT TOTAL KNEE ARTHROPLASTY (Right) Up with therapy Discharge home with home health      Mcarthur Rossetti 04/27/2022, 8:05 AM

## 2022-04-27 NOTE — Evaluation (Signed)
Occupational Therapy Evaluation Patient Details Name: Katie Gaines MRN: [id number] DOB: 1955-10-01 Today's Date: 04/27/2022   History of Present Illness Pt is 66 yo female s/p R TKA on 04/26/22.  Pt with hx including 10 spinal surgeries, L THA 2022, arthritis, chronic pain syndrome   Clinical Impression   Katie Gaines was evaluated s/p the above admission list, she is indep at baseline. Upon evaluation pt had functional limitations due to expected post-op pain, decreased ROM, strength and balance and impulsivity. She needed min A for bed mobility, close min G for transfers and mobility along with cues fro safety. She needed mod A for LB dressing and total A to don KI. Pt is confident that her husband will able to assist with mobility and ADLs at d/c. OT to continue to follow should her LOS continue. Recommend pt follow surgeons plan for follow up therapies.      Recommendations for follow up therapy are one component of a multi-disciplinary discharge planning process, led by the attending physician.  Recommendations may be updated based on patient status, additional functional criteria and insurance authorization.   Follow Up Recommendations  Home health OT    Assistance Recommended at Discharge Frequent or constant Supervision/Assistance  Patient can return home with the following A little help with walking and/or transfers;A lot of help with bathing/dressing/bathroom;Assistance with cooking/housework;Assist for transportation;Help with stairs or ramp for entrance    Functional Status Assessment  Patient has had a recent decline in their functional status and demonstrates the ability to make significant improvements in function in a reasonable and predictable amount of time.  Equipment Recommendations  None recommended by OT       Precautions / Restrictions Precautions Precautions: Fall Required Braces or Orthoses: Knee Immobilizer - Right Knee Immobilizer - Right: Discontinue once  straight leg raise with < 10 degree lag Restrictions Weight Bearing Restrictions: Yes RLE Weight Bearing: Weight bearing as tolerated      Mobility Bed Mobility Overal bed mobility: Needs Assistance Bed Mobility: Supine to Sit, Sit to Supine     Supine to sit: Min assist Sit to supine: Min assist   General bed mobility comments: to manage RLE    Transfers Overall transfer level: Needs assistance Equipment used: Rolling walker (2 wheels) Transfers: Sit to/from Stand Sit to Stand: Min guard           General transfer comment: increased time, cue for hand placement      Balance Overall balance assessment: Needs assistance Sitting-balance support: No upper extremity supported Sitting balance-Leahy Scale: Good     Standing balance support: Bilateral upper extremity supported, Reliant on assistive device for balance Standing balance-Leahy Scale: Poor                             ADL either performed or assessed with clinical judgement   ADL Overall ADL's : Needs assistance/impaired Eating/Feeding: Independent;Sitting   Grooming: Min guard;Standing   Upper Body Bathing: Set up;Sitting   Lower Body Bathing: Moderate assistance;Sit to/from stand   Upper Body Dressing : Set up;Sitting   Lower Body Dressing: Moderate assistance;Sit to/from stand Lower Body Dressing Details (indicate cue type and reason): total A for KI Toilet Transfer: Min guard;Ambulation;Rolling walker (2 wheels) Toilet Transfer Details (indicate cue type and reason): impulsive Toileting- Clothing Manipulation and Hygiene: Supervision/safety;Sitting/lateral lean       Functional mobility during ADLs: Min guard;Rolling walker (2 wheels) General ADL Comments: several mild LOBs, able  to self correct. close min G for safety. cues for safeyt.,     Vision Baseline Vision/History: 0 No visual deficits Vision Assessment?: No apparent visual deficits     Perception     Praxis       Pertinent Vitals/Pain Pain Assessment Pain Assessment: Faces Faces Pain Scale: Hurts even more Pain Location: R knee Pain Descriptors / Indicators: Discomfort, Sharp Pain Intervention(s): Limited activity within patient's tolerance, Monitored during session     Hand Dominance Right   Extremity/Trunk Assessment Upper Extremity Assessment Upper Extremity Assessment: Overall WFL for tasks assessed   Lower Extremity Assessment Lower Extremity Assessment: Defer to PT evaluation   Cervical / Trunk Assessment Cervical / Trunk Assessment: Normal   Communication Communication Communication: No difficulties   Cognition Arousal/Alertness: Awake/alert Behavior During Therapy: Impulsive Overall Cognitive Status: Within Functional Limits for tasks assessed                                 General Comments: impulsive, self distracting needs cues for safety     General Comments  VSS on RA            Home Living Family/patient expects to be discharged to:: Private residence Living Arrangements: Spouse/significant other Available Help at Discharge: Family;Available 24 hours/day Type of Home: House Home Access: Stairs to enter [employer] of Steps: 1 Entrance Stairs-Rails: None Home Layout: One level     Bathroom Shower/Tub: Occupational psychologist: Handicapped height Bathroom Accessibility: Yes   Home Equipment: Adaptive equipment;Hand held shower head;Grab bars - tub/shower;Cane - single point;Shower seat - built Medical sales representative (2 wheels);BSC/3in1 [employer] Equipment: Financial trader Comments: husband 24/7      Prior Functioning/Environment Prior Level of Function : Independent/Modified Independent             Mobility Comments: no AD, active indep at baseline ADLs Comments: indep        OT Problem List: Decreased strength;Decreased range of motion;Decreased activity tolerance;Impaired balance (sitting and/or  standing);Decreased safety awareness;Decreased knowledge of precautions;Pain      OT Treatment/Interventions: Self-care/ADL training;Therapeutic exercise;DME and/or AE instruction;Therapeutic activities;Patient/family education;Balance training    OT Goals(Current goals can be found in the care plan section) Acute Rehab OT Goals Patient Stated Goal: home today OT Goal Formulation: With patient Time For Goal Achievement: 05/11/22 Potential to Achieve Goals: Good ADL Goals Additional ADL Goal #1: Pt will complete all BADLs with mod I  OT Frequency: Min 2X/week       AM-PAC OT "6 Clicks" Daily Activity     Outcome Measure Help from another person eating meals?: None Help from another person taking care of personal grooming?: A Little Help from another person toileting, which includes using toliet, bedpan, or urinal?: A Little Help from another person bathing (including washing, rinsing, drying)?: A Lot Help from another person to put on and taking off regular upper body clothing?: A Little Help from another person to put on and taking off regular lower body clothing?: A Lot 6 Click Score: 17   End of Session Equipment Utilized During Treatment: Rolling walker (2 wheels);Right knee immobilizer Nurse Communication: Mobility status  Activity Tolerance: Patient tolerated treatment well Patient left: in bed;with call bell/phone within reach;with bed alarm set  OT Visit Diagnosis: Unsteadiness on feet (R26.81);Other abnormalities of gait and mobility (R26.89);Pain                Time: 4098-1191 OT  Time Calculation (min): 28 min Charges:  OT General Charges $OT Visit: 1 Visit OT Evaluation $OT Eval Moderate Complexity: 1 Mod OT Treatments $Self Care/Home Management : 8-22 mins   Elliot Cousin 04/27/2022, 8:28 AM

## 2022-04-28 ENCOUNTER — Other Ambulatory Visit: Payer: Self-pay | Admitting: Orthopaedic Surgery

## 2022-04-28 ENCOUNTER — Telehealth: Payer: Self-pay

## 2022-04-28 MED ORDER — HYDROMORPHONE HCL 4 MG PO TABS: 4.0000 mg | ORAL_TABLET | ORAL | 0 refills | Status: DC | PRN

## 2022-04-28 NOTE — Telephone Encounter (Signed)
Tried to call and  advise to fill the dilaudid but was on hold for a long time. I will try again later

## 2022-04-28 NOTE — Telephone Encounter (Signed)
Lvm advising  

## 2022-04-28 NOTE — Telephone Encounter (Signed)
Katie Gaines, at [facility] would like clarification on which Rx to fill for patient?  Stated that Rx for hydromorphone was sent in and Rx for oxycodone sent 04/27/2022.  Cb# [phone].  Please advise.  Thank you.

## 2022-05-02 ENCOUNTER — Encounter: Payer: Self-pay | Admitting: Orthopaedic Surgery

## 2022-05-02 DIAGNOSIS — Z96641 Presence of right artificial hip joint: Secondary | ICD-10-CM

## 2022-05-03 NOTE — Telephone Encounter (Signed)
U/s was cancelled

## 2022-05-03 NOTE — Telephone Encounter (Signed)
I called and talked to the pt. She stated swelling is going down and doesn't feel she needs the ultrasound at this time. She will ice and elevate and call us if she continues to have problems

## 2022-05-03 NOTE — Telephone Encounter (Signed)
Us ordered

## 2022-05-06 ENCOUNTER — Other Ambulatory Visit: Payer: Self-pay | Admitting: Orthopaedic Surgery

## 2022-05-06 ENCOUNTER — Telehealth: Payer: Self-pay | Admitting: Orthopaedic Surgery

## 2022-05-06 MED ORDER — OXYCODONE HCL 5 MG PO TABS: 5.0000 mg | ORAL_TABLET | ORAL | 0 refills | Status: DC | PRN

## 2022-05-06 MED ORDER — HYDROMORPHONE HCL 4 MG PO TABS: 4.0000 mg | ORAL_TABLET | ORAL | 0 refills | Status: DC | PRN

## 2022-05-06 NOTE — Telephone Encounter (Signed)
Patient called asked for the Hydromorphone 4 mg tabs to be filled instead of the Oxycodone. The number to contact patient is [phone]

## 2022-05-06 NOTE — Telephone Encounter (Signed)
Pt called requesting a refill of oxycodone. Please send to pharmacy on file. Pt phone number is [phone]

## 2022-05-06 NOTE — Telephone Encounter (Signed)
Please advise 

## 2022-05-09 ENCOUNTER — Other Ambulatory Visit: Payer: Self-pay | Admitting: Orthopaedic Surgery

## 2022-05-09 ENCOUNTER — Encounter: Payer: Self-pay | Admitting: Orthopaedic Surgery

## 2022-05-09 ENCOUNTER — Ambulatory Visit (INDEPENDENT_AMBULATORY_CARE_PROVIDER_SITE_OTHER): Payer: Medicare Other | Admitting: Orthopaedic Surgery

## 2022-05-09 ENCOUNTER — Telehealth: Payer: Self-pay | Admitting: Orthopaedic Surgery

## 2022-05-09 DIAGNOSIS — Z96651 Presence of right artificial knee joint: Secondary | ICD-10-CM

## 2022-05-09 MED ORDER — HYDROMORPHONE HCL 4 MG PO TABS: 4.0000 mg | ORAL_TABLET | ORAL | 0 refills | Status: DC | PRN

## 2022-05-09 NOTE — Telephone Encounter (Signed)
Please advise 

## 2022-05-09 NOTE — Telephone Encounter (Signed)
Pt called and stated she found a [facility] that is in stock of the hydromorphone. Please send to [facility] at [place]. Pt phone  number is [phone].

## 2022-05-09 NOTE — Progress Notes (Signed)
This is the first follow-up visit for the patient.  She is 67 years old and is 2 weeks out from a right total knee arthroplasty.  She is doing well.  Notes from physical therapy stated they have been able to flex her to 90 degrees.  My exam today I can flex her to almost 90 degrees and extension barely lacks full extension.  Her incision looks good.  The staples have been removed and Steri-Strips applied.  There is swelling in the foot and ankle and her calf is soft.  We will have her transition to outpatient physical therapy within the next week or so.  This will be through [facility] is where she is requesting.  The next time you need to see her back in 4 weeks to assess her motion but no x-rays are needed.

## 2022-05-16 ENCOUNTER — Encounter: Payer: Self-pay | Admitting: Orthopaedic Surgery

## 2022-05-17 ENCOUNTER — Other Ambulatory Visit: Payer: Self-pay | Admitting: Orthopaedic Surgery

## 2022-05-17 MED ORDER — OXYCODONE HCL 5 MG PO TABS: 5.0000 mg | ORAL_TABLET | ORAL | 0 refills | Status: DC | PRN

## 2022-05-19 ENCOUNTER — Encounter: Payer: Self-pay | Admitting: Orthopaedic Surgery

## 2022-05-19 ENCOUNTER — Other Ambulatory Visit: Payer: Self-pay | Admitting: Orthopaedic Surgery

## 2022-05-19 MED ORDER — HYDROMORPHONE HCL 4 MG PO TABS: 4.0000 mg | ORAL_TABLET | ORAL | 0 refills | Status: DC | PRN

## 2022-06-09 ENCOUNTER — Ambulatory Visit (INDEPENDENT_AMBULATORY_CARE_PROVIDER_SITE_OTHER): Payer: Medicare Other | Admitting: Orthopaedic Surgery

## 2022-06-09 ENCOUNTER — Encounter: Payer: Self-pay | Admitting: Orthopaedic Surgery

## 2022-06-09 DIAGNOSIS — Z96651 Presence of right artificial knee joint: Secondary | ICD-10-CM

## 2022-06-09 NOTE — Progress Notes (Signed)
The patient is now 6 weeks status post a right total knee arthroplasty.  She is an active 66 year old female.  She says that she is, along and reports better range of motion and strength.  She has been going to [facility].  On exam she has almost full extension of her right knee and almost full flexion.  Feels limply stable.  She has swelling to be expected.  She is ahead of most people at this point.  She will continue to increase her activities as comfort allows.  The next time I need to see her back is not for 3 months unless there are issues.  At that visit we will have a standing AP and lateral of her right operative knee.

## 2022-06-20 ENCOUNTER — Other Ambulatory Visit: Payer: Medicare Other

## 2022-06-22 ENCOUNTER — Telehealth: Payer: Self-pay

## 2022-06-22 NOTE — Telephone Encounter (Signed)
Patient called stating that something has changed with her right knee.  Stated that her right leg is weak and that it hurts down to her right foot.  Would like a call to discuss.  Cb# [phone].  Please advise.  Thank you.

## 2022-06-23 NOTE — Telephone Encounter (Signed)
I called and talked to the pt. She stated she worked with PT today and thinks she is going to be ok now. Said she will call back if anything changes

## 2022-06-23 NOTE — Telephone Encounter (Signed)
Tried calling pt back, no answer and unable to leave voice mail

## 2022-08-11 ENCOUNTER — Ambulatory Visit (INDEPENDENT_AMBULATORY_CARE_PROVIDER_SITE_OTHER): Payer: Medicare Other

## 2022-08-11 ENCOUNTER — Ambulatory Visit (INDEPENDENT_AMBULATORY_CARE_PROVIDER_SITE_OTHER): Payer: Medicare Other | Admitting: Orthopaedic Surgery

## 2022-08-11 DIAGNOSIS — Z96651 Presence of right artificial knee joint: Secondary | ICD-10-CM | POA: Diagnosis not present

## 2022-08-11 MED ORDER — METHOCARBAMOL 500 MG PO TABS: 500.0000 mg | ORAL_TABLET | Freq: Four times a day (QID) | ORAL | 1 refills | Status: DC | PRN

## 2022-08-11 MED ORDER — HYDROCODONE-ACETAMINOPHEN 5-325 MG PO TABS: 1.0000 | ORAL_TABLET | Freq: Four times a day (QID) | ORAL | 0 refills | Status: DC | PRN

## 2022-08-11 NOTE — Progress Notes (Signed)
The patient is a 67 year old female well-known to me.  She is just over 3 months status post a right total knee arthroplasty.  She had a fall recently and she will make sure she did not injure her total knee arthroplasty.  She is having some low back pain as well.  This was about 3 weeks ago when she fell.  On exam she still has swelling in that knee to be expected.  Her extension is almost full and her flexion is almost full.  The knee feels limply stable.  Her extensor mechanism is also intact.  3 views of the right knee show well-maintained total knee arthroplasty in no acute findings and no evidence of fracture.  I gave her reassurance that she is making good progress.  I will send in a pain medication or muscle relaxant.  She can get back into water aerobics in the pool and using the exercise bike at the gym.  The next on Monday to see her back is not for 3 months but no x-rays are needed.  Of note we have also replaced her hip before.

## 2022-09-12 ENCOUNTER — Ambulatory Visit: Payer: Medicare Other | Admitting: Orthopaedic Surgery

## 2022-10-13 ENCOUNTER — Encounter: Payer: Self-pay | Admitting: Radiology

## 2022-11-10 ENCOUNTER — Encounter: Payer: Self-pay | Admitting: Orthopaedic Surgery

## 2022-11-10 ENCOUNTER — Ambulatory Visit (INDEPENDENT_AMBULATORY_CARE_PROVIDER_SITE_OTHER): Payer: Medicare Other | Admitting: Orthopaedic Surgery

## 2022-11-10 DIAGNOSIS — Z96651 Presence of right artificial knee joint: Secondary | ICD-10-CM | POA: Diagnosis not present

## 2022-11-10 NOTE — Progress Notes (Signed)
The patient is now 6 months status post a right total knee arthroplasty.  She has some reservations about the knee in terms of she does not feel like she is had a great recovery as of yet.  However she has not been in any extensive therapy.  She has had multiple back surgery in the past and says she is always used to bending at the waist and has not bent at the knees much.  She is interested in outpatient physical therapy.  On exam her knee lacks full extension by just a few degrees and I can flex her to just past 90 degrees.  I do feel that she would benefit from outpatient physical therapy for her mobility and strengthening.  She is a thin and active individual.  We will set her up for outpatient physical therapy for any modalities that therapist can do to strengthen her right knee and to work on range of motion as well.  Will see her back in 6 weeks at that visit I would like an AP and lateral of her right knee.

## 2022-11-11 ENCOUNTER — Other Ambulatory Visit: Payer: Self-pay

## 2022-11-11 DIAGNOSIS — Z96651 Presence of right artificial knee joint: Secondary | ICD-10-CM

## 2022-11-14 NOTE — Therapy (Signed)
OUTPATIENT PHYSICAL THERAPY EVALUATION   Patient Name: Katie Gaines MRN: [id number] DOB:[birth year], 67 y.o., female Today's Date: 11/15/2022  END OF SESSION:  PT End of Session - 11/15/22 1258     Visit Number 1    Number of Visits 20    Date for PT Re-Evaluation 01/24/23    Authorization Type Medicare    Progress Note Due on Visit 10    PT Start Time 1301    PT Stop Time 1340    PT Time Calculation (min) 39 min    Activity Tolerance Patient tolerated treatment well    Behavior During Therapy WFL for tasks assessed/performed             Past Medical History:  Diagnosis Date   Anemia    Anxiety disorder    Cancer    Common bile duct stone    Graves disease    History of kidney stones    Hypertension    Hypothyroidism    Iron deficiency    Lymphedema of leg    bilateral   Past Surgical History:  Procedure Laterality Date   ANTERIOR LAT LUMBAR FUSION N/A 08/26/2019   Procedure: ANTERIOR LATERAL LUMBAR INTERBODY FUSION, LUMBAR ONE LUMBAR TWO;  Surgeon: Donalee Citrin, MD;  Location: [facility] OR;  Service: Neurosurgery;  Laterality: N/A;  anterolateral   APPENDECTOMY     BACK SURGERY  1987-2007   X 10 Surgery   CHOLECYSTECTOMY     ENTEROSCOPY  11/10/2011   Procedure: ENTEROSCOPY;  Surgeon: Louis Meckel, MD;  Location: [facility];  Service: Endoscopy;  Laterality: N/A;   ERCP  07/19/2011   Procedure: ENDOSCOPIC RETROGRADE CHOLANGIOPANCREATOGRAPHY (ERCP);  Surgeon: Louis Meckel, MD;  Location: [facility];  Service: Endoscopy;  Laterality: N/A;   ESOPHAGOGASTRODUODENOSCOPY  09/2018   FRACTURE SURGERY Left 2018   left arm fracture   HOT HEMOSTASIS  11/10/2011   Procedure: HOT HEMOSTASIS (ARGON PLASMA COAGULATION/BICAP);  Surgeon: Louis Meckel, MD;  Location: [facility];  Service: Endoscopy;  Laterality: N/A;   TOTAL HIP ARTHROPLASTY Left 06/25/2021   Procedure: LEFT TOTAL HIP ARTHROPLASTY ANTERIOR APPROACH;  Surgeon: Kathryne Hitch, MD;  Location: [facility];   Service: Orthopedics;  Laterality: Left;   TOTAL KNEE ARTHROPLASTY Right 04/26/2022   Procedure: RIGHT TOTAL KNEE ARTHROPLASTY;  Surgeon: Kathryne Hitch, MD;  Location: [facility] OR;  Service: Orthopedics;  Laterality: Right;   Patient Active Problem List   Diagnosis Date Noted   Arthritis of knee, right 04/26/2022   Status post total right knee replacement 04/26/2022   Status post left hip replacement 06/25/2021   Avascular necrosis of hip, left 06/24/2021   Fusion of spine of lumbar region 03/17/2021   Common peroneal neuropathy, left 06/02/2020   Dysesthesia 03/16/2020   Numbness 03/16/2020   Visual changes 03/16/2020   History of fusion of cervical spine 03/16/2020   History of fusion of lumbar spine 03/16/2020   Fatigue 11/29/2019   DDD (degenerative disc disease), lumbar 08/26/2019   Gastroesophageal reflux disease 06/17/2019   Vitamin D deficiency 06/17/2019   Irritable bowel syndrome with diarrhea 03/06/2019   Closed fracture of distal end of radius 10/04/2018   Injury of right wrist 09/13/2018   Low back pain 07/15/2016   Pain syndrome, chronic 09/09/2012   Postlaminectomy syndrome, not elsewhere classified 09/09/2012   Anemia 05/30/2012   Essential hypertension 02/13/2012   Chronic gastrointestinal bleeding 01/04/2012   Hypothyroidism 01/04/2012   Obstruction of bile duct 07/19/2011   Abnormal  radiographic examination 07/19/2011   IRON DEFICIENCY 09/10/2009    PCP: Eartha InchBadger, Bryli Mantey C. MD  REFERRING PROVIDER: Kathryne HitchBlackman, Christopher Y, MD  REFERRING DIAG: [phone] (ICD-10-CM) - History of total right knee replacement  THERAPY DIAG:  Chronic pain of right knee  Muscle weakness (generalized)  Difficulty in walking, not elsewhere classified  Stiffness of right knee, not elsewhere classified  Rationale for Evaluation and Treatment: Rehabilitation  ONSET DATE: 04/26/2022 Rt TKA  SUBJECTIVE:   SUBJECTIVE STATEMENT: She reported continued knee pain following  process of Rt TKA 04/26/2022.  She indicated that about a month ago she said her knee "got caught/stuck." With bending.  She indicated she continued to do walking, gardening and reported limitation due to knee symptoms.   She reported she hasn't driven much since the surgery.  She reported history of extensive back surgeries with residual pains in Lt leg related to those symptoms.    PERTINENT HISTORY: Lt THA anterior 06/2021, L2-3 lumbar laminectomy/facetectomies/foraminotomies, PLIF L2-3, pedicle screw fixation L2-3, posterior lateral arthrodesis L2-3 on 8/10. PMH includes anemia, anxiety, hx of kidney stones, graves disease, HTN, hypothyroidism, iron deficiency, lymphedema,  PAIN:  NPRS scale: at worst in last few weeks 6/10, at current 0/10 Pain location: Rt knee - reported complaints in ankle/shin as well Pain description: "caught feeling" achy, sharp pain at times Aggravating factors: bending knee, car transfers, stairs Relieving factors: rest  PRECAUTIONS: None  WEIGHT BEARING RESTRICTIONS: No  FALLS:  Has patient fallen in last 6 months? 1 fall without injury No fear of falling reported.    LIVING ENVIRONMENT: Lives in: House/apartment Stairs: two stairs with one rail on Rt going up  OCCUPATION: no work.   PLOF: Independent, garden/yard work, Rt hand dominant, walking, travel.  Has membership [employer]  PATIENT GOALS: Reduce pain, get knee better  OBJECTIVE:   PATIENT SURVEYS:  11/15/2022 FOTO intake:  59  predicted:  65  COGNITION: 11/15/2022 Overall cognitive status: WFL    SENSATION: 11/15/2022 Not tested  MUSCLE LENGTH: 11/15/2022 No specific testing today.  POSTURE:  11/15/2022 No Significant postural limitations  PALPATION: 11/15/2022 No specific tenderness reported today.   LOWER EXTREMITY ROM:   ROM Right 11/15/2022 Left 11/15/2022  Hip flexion    Hip extension    Hip abduction    Hip adduction    Hip internal rotation    Hip external rotation    Knee  flexion 102 AROM in supine heel slide    Knee extension -4 AROM in LAQ   Ankle dorsiflexion    Ankle plantarflexion    Ankle inversion    Ankle eversion     (Blank rows = not tested)  LOWER EXTREMITY MMT:  MMT Right 11/15/2022 Left 11/15/2022  Hip flexion 5/5 5/5  Hip extension    Hip abduction    Hip adduction    Hip internal rotation    Hip external rotation    Knee flexion 5/5 5/5  Knee extension 5/5  34.8, 35.2 lbs 5/5  47.3, 46  Ankle dorsiflexion 5/5 5/5  Ankle plantarflexion    Ankle inversion    Ankle eversion     (Blank rows = not tested)  LOWER EXTREMITY SPECIAL TESTS:  11/15/2022 No specific testing today  FUNCTIONAL TESTS:  11/15/2022 18 inch chair transfer: Lt SLS: 3 seconds  Rt SLS: 7 seconds  GAIT: 11/15/2022 Independent ambulation in clinic on level surfaces.    TODAY'S TREATMENT  DATE:11/15/2022 Therex:    HEP instruction/performance c cues for techniques, handout provided.  Trial set performed of each for comprehension and symptom assessment.  See below for exercise list  PATIENT EDUCATION:  11/15/2022 Education details: HEP, POC Person educated: Patient Education method: Explanation, Demonstration, Verbal cues, and Handouts Education comprehension: verbalized understanding, returned demonstration, and verbal cues required  HOME EXERCISE PROGRAM: Access Code: [email or web address] URL: [email or web address] Date: 11/15/2022 Prepared by: Chyrel Masson  Exercises - Seated Long Arc Quad  - 3-5 x daily - 7 x weekly - 1-2 sets - 10 reps - 2 hold - Seated Knee Flexion Extension AAROM with Overpressure  - 2-3 x daily - 7 x weekly - 1 sets - 5 reps - 10 hold - Seated Quad Set  - 3-5 x daily - 7 x weekly - 1 sets - 10 reps - 5 hold - Seated Straight Leg Heel Taps  - 1-2 x daily - 7 x weekly - 2-3 sets - 10 reps - Sit to Stand  - 3 x daily - 7 x weekly - 1 sets - 10  reps  ASSESSMENT:  CLINICAL IMPRESSION: Patient is a 67 y.o. who comes to clinic with complaints of Rt knee pain s/p Rt TKA 04/26/2022 with mobility, strength and movement coordination deficits that impair their ability to perform usual daily and recreational functional activities without increase difficulty/symptoms at this time.  Patient to benefit from skilled PT services to address impairments and limitations to improve to previous level of function without restriction secondary to condition.   OBJECTIVE IMPAIRMENTS: decreased activity tolerance, decreased balance, decreased coordination, decreased endurance, decreased mobility, difficulty walking, decreased ROM, decreased strength, impaired perceived functional ability, impaired flexibility, improper body mechanics, and pain.   ACTIVITY LIMITATIONS: carrying, lifting, bending, standing, stairs, transfers, and locomotion level  PARTICIPATION LIMITATIONS: interpersonal relationship, driving, community activity, and yard work  PERSONAL FACTORS:  Lt THA anterior 06/2021, multiple back surgeries. PMH includes anemia, anxiety, hx of kidney stones, graves disease, HTN, hypothyroidism, iron deficiency, lymphedema,   are also affecting patient's functional outcome.   REHAB POTENTIAL: Good  CLINICAL DECISION MAKING: Stable/uncomplicated  EVALUATION COMPLEXITY: Low   GOALS: Goals reviewed with patient? Yes  SHORT TERM GOALS: (target date for Short term goals are 3 weeks 12/06/2022)   1.  Patient will demonstrate independent use of home exercise program to maintain progress from in clinic treatments.  Goal status: New  LONG TERM GOALS: (target dates for all long term goals are 10 weeks  01/24/2023 )   1. Patient will demonstrate/report pain at worst less than or equal to 2/10 to facilitate minimal limitation in daily activity secondary to pain symptoms.  Goal status: New   2. Patient will demonstrate independent use of home exercise  program to facilitate ability to maintain/progress functional gains from skilled physical therapy services.  Goal status: New   3. Patient will demonstrate FOTO outcome > or = 65 % to indicate reduced disability due to condition.  Goal status: New   4.  Patient will demonstrate Rt LE MMT 5/5 throughout to faciltiate usual transfers, stairs, squatting at [facility] for daily life.   Goal status: New   5.  Patient will demonstrate Rt knee AROM 0-110 deg to facilitate usual mobility for daily activity , transfers.  Goal status: New   6.  Patient will demonstrate ascending/descending stairs c reciprocal gait pattern with one rail for house navigation.  Goal status: New    PLAN:  PT FREQUENCY: 1-2x/week  PT DURATION:  10 weeks  PLANNED INTERVENTIONS: Therapeutic exercises, Therapeutic activity, Neuro Muscular re-education, Balance training, Gait training, Patient/Family education, Joint mobilization, Stair training, DME instructions, Dry Needling, Electrical stimulation, Traction, Cryotherapy, vasopneumatic deviceMoist heat, Taping, Ultrasound, Ionotophoresis 4mg /ml Dexamethasone, and aquatic therapy, Manual therapy.  All included unless contraindicated  PLAN FOR NEXT SESSION: Review HEP knowledge/results.   Progressive strengthening.     Chyrel Masson, PT, DPT, OCS, ATC 11/15/22  1:52 PM

## 2022-11-15 ENCOUNTER — Ambulatory Visit (INDEPENDENT_AMBULATORY_CARE_PROVIDER_SITE_OTHER): Payer: Medicare Other | Admitting: Rehabilitative and Restorative Service Providers"

## 2022-11-15 ENCOUNTER — Encounter: Payer: Self-pay | Admitting: Rehabilitative and Restorative Service Providers"

## 2022-11-15 ENCOUNTER — Other Ambulatory Visit: Payer: Self-pay

## 2022-11-15 DIAGNOSIS — M25661 Stiffness of right knee, not elsewhere classified: Secondary | ICD-10-CM | POA: Diagnosis not present

## 2022-11-15 DIAGNOSIS — M25561 Pain in right knee: Secondary | ICD-10-CM

## 2022-11-15 DIAGNOSIS — M6281 Muscle weakness (generalized): Secondary | ICD-10-CM

## 2022-11-15 DIAGNOSIS — R262 Difficulty in walking, not elsewhere classified: Secondary | ICD-10-CM | POA: Diagnosis not present

## 2022-11-15 DIAGNOSIS — G8929 Other chronic pain: Secondary | ICD-10-CM

## 2022-11-22 ENCOUNTER — Encounter: Payer: Self-pay | Admitting: Rehabilitative and Restorative Service Providers"

## 2022-11-22 ENCOUNTER — Ambulatory Visit (INDEPENDENT_AMBULATORY_CARE_PROVIDER_SITE_OTHER): Payer: Medicare Other | Admitting: Rehabilitative and Restorative Service Providers"

## 2022-11-22 DIAGNOSIS — M25561 Pain in right knee: Secondary | ICD-10-CM

## 2022-11-22 DIAGNOSIS — M25661 Stiffness of right knee, not elsewhere classified: Secondary | ICD-10-CM

## 2022-11-22 DIAGNOSIS — M6281 Muscle weakness (generalized): Secondary | ICD-10-CM | POA: Diagnosis not present

## 2022-11-22 DIAGNOSIS — R262 Difficulty in walking, not elsewhere classified: Secondary | ICD-10-CM

## 2022-11-22 DIAGNOSIS — G8929 Other chronic pain: Secondary | ICD-10-CM

## 2022-11-22 NOTE — Therapy (Signed)
OUTPATIENT PHYSICAL THERAPY TREATMENT   Patient Name: Katie Gaines MRN: [id number] DOB:08/21/1955, 67 y.o., female Today's Date: 11/22/2022  END OF SESSION:  PT End of Session - 11/22/22 1329     Visit Number 2    Number of Visits 20    Date for PT Re-Evaluation 01/24/23    Authorization Type Medicare    Progress Note Due on Visit 10    PT Start Time 1325    PT Stop Time 1405    PT Time Calculation (min) 40 min    Activity Tolerance Patient tolerated treatment well    Behavior During Therapy WFL for tasks assessed/performed              Past Medical History:  Diagnosis Date   Anemia    Anxiety disorder    Cancer    Common bile duct stone    Graves disease    History of kidney stones    Hypertension    Hypothyroidism    Iron deficiency    Lymphedema of leg    bilateral   Past Surgical History:  Procedure Laterality Date   ANTERIOR LAT LUMBAR FUSION N/A 08/26/2019   Procedure: ANTERIOR LATERAL LUMBAR INTERBODY FUSION, LUMBAR ONE LUMBAR TWO;  Surgeon: Donalee Citrin, MD;  Location: [facility] OR;  Service: Neurosurgery;  Laterality: N/A;  anterolateral   APPENDECTOMY     BACK SURGERY  1987-2007   X 10 Surgery   CHOLECYSTECTOMY     ENTEROSCOPY  11/10/2011   Procedure: ENTEROSCOPY;  Surgeon: Louis Meckel, MD;  Location: [facility];  Service: Endoscopy;  Laterality: N/A;   ERCP  07/19/2011   Procedure: ENDOSCOPIC RETROGRADE CHOLANGIOPANCREATOGRAPHY (ERCP);  Surgeon: Louis Meckel, MD;  Location: [facility];  Service: Endoscopy;  Laterality: N/A;   ESOPHAGOGASTRODUODENOSCOPY  09/2018   FRACTURE SURGERY Left 2018   left arm fracture   HOT HEMOSTASIS  11/10/2011   Procedure: HOT HEMOSTASIS (ARGON PLASMA COAGULATION/BICAP);  Surgeon: Louis Meckel, MD;  Location: [facility];  Service: Endoscopy;  Laterality: N/A;   TOTAL HIP ARTHROPLASTY Left 06/25/2021   Procedure: LEFT TOTAL HIP ARTHROPLASTY ANTERIOR APPROACH;  Surgeon: Kathryne Hitch, MD;  Location: [facility]  ORS;  Service: Orthopedics;  Laterality: Left;   TOTAL KNEE ARTHROPLASTY Right 04/26/2022   Procedure: RIGHT TOTAL KNEE ARTHROPLASTY;  Surgeon: Kathryne Hitch, MD;  Location: [facility] OR;  Service: Orthopedics;  Laterality: Right;   Patient Active Problem List   Diagnosis Date Noted   Arthritis of knee, right 04/26/2022   Status post total right knee replacement 04/26/2022   Status post left hip replacement 06/25/2021   Avascular necrosis of hip, left 06/24/2021   Fusion of spine of lumbar region 03/17/2021   Common peroneal neuropathy, left 06/02/2020   Dysesthesia 03/16/2020   Numbness 03/16/2020   Visual changes 03/16/2020   History of fusion of cervical spine 03/16/2020   History of fusion of lumbar spine 03/16/2020   Fatigue 11/29/2019   DDD (degenerative disc disease), lumbar 08/26/2019   Gastroesophageal reflux disease 06/17/2019   Vitamin D deficiency 06/17/2019   Irritable bowel syndrome with diarrhea 03/06/2019   Closed fracture of distal end of radius 10/04/2018   Injury of right wrist 09/13/2018   Low back pain 07/15/2016   Pain syndrome, chronic 09/09/2012   Postlaminectomy syndrome, not elsewhere classified 09/09/2012   Anemia 05/30/2012   Essential hypertension 02/13/2012   Chronic gastrointestinal bleeding 01/04/2012   Hypothyroidism 01/04/2012   Obstruction of bile duct 07/19/2011  Abnormal radiographic examination 07/19/2011   IRON DEFICIENCY 09/10/2009    PCP: Eartha Inch MD  REFERRING PROVIDER: Kathryne Hitch, MD  REFERRING DIAG: [phone] (ICD-10-CM) - History of total right knee replacement  THERAPY DIAG:  Chronic pain of right knee  Muscle weakness (generalized)  Difficulty in walking, not elsewhere classified  Stiffness of right knee, not elsewhere classified  Rationale for Evaluation and Treatment: Rehabilitation  ONSET DATE: 04/26/2022 Rt TKA  SUBJECTIVE:   SUBJECTIVE STATEMENT: She indicated "very light" symptoms  upon arrival today.  She indicated feeling good overall about new exercises at home.  She reported some complaints in medial knee and Rt ankle in time since last visit.    PERTINENT HISTORY: Lt THA anterior 06/2021, L2-3 lumbar laminectomy/facetectomies/foraminotomies, PLIF L2-3, pedicle screw fixation L2-3, posterior lateral arthrodesis L2-3 on 8/10. PMH includes anemia, anxiety, hx of kidney stones, graves disease, HTN, hypothyroidism, iron deficiency, lymphedema,  PAIN:  NPRS scale: "very light" but not rated on number scale.  Pain location: Rt knee - reported complaints in ankle/shin as well Pain description: "caught feeling" achy, sharp pain at times Aggravating factors: bending knee, car transfers, stairs Relieving factors: rest  PRECAUTIONS: None  WEIGHT BEARING RESTRICTIONS: No  FALLS:  Has patient fallen in last 6 months? 1 fall without injury No fear of falling reported.    LIVING ENVIRONMENT: Lives in: House/apartment Stairs: two stairs with one rail on Rt going up  OCCUPATION: no work.   PLOF: Independent, garden/yard work, Rt hand dominant, walking, travel.  Has membership [employer]  PATIENT GOALS: Reduce pain, get knee better  OBJECTIVE:   PATIENT SURVEYS:  11/15/2022 FOTO intake:  59  predicted:  65  COGNITION: 11/15/2022 Overall cognitive status: WFL    SENSATION: 11/15/2022 Not tested  MUSCLE LENGTH: 11/15/2022 No specific testing today.  POSTURE:  11/15/2022 No Significant postural limitations  PALPATION: 11/15/2022 No specific tenderness reported today.   LOWER EXTREMITY ROM:   ROM Right 11/15/2022 Left 11/15/2022  Hip flexion    Hip extension    Hip abduction    Hip adduction    Hip internal rotation    Hip external rotation    Knee flexion 102 AROM in supine heel slide    Knee extension -4 AROM in LAQ   Ankle dorsiflexion    Ankle plantarflexion    Ankle inversion    Ankle eversion     (Blank rows = not tested)  LOWER EXTREMITY MMT:  MMT  Right 11/15/2022 Left 11/15/2022  Hip flexion 5/5 5/5  Hip extension    Hip abduction    Hip adduction    Hip internal rotation    Hip external rotation    Knee flexion 5/5 5/5  Knee extension 5/5  34.8, 35.2 lbs 5/5  47.3, 46  Ankle dorsiflexion 5/5 5/5  Ankle plantarflexion    Ankle inversion    Ankle eversion     (Blank rows = not tested)  LOWER EXTREMITY SPECIAL TESTS:  11/15/2022 No specific testing today  FUNCTIONAL TESTS:  11/15/2022 18 inch chair transfer: Lt SLS: 3 seconds  Rt SLS: 7 seconds  GAIT: 11/15/2022 Independent ambulation in clinic on level surfaces.    TODAY'S TREATMENT  DATE:11/22/2022 Therex: Nustep Lvl 5 10 mins UE/LE Leg press double leg 100 lbs x 15, Rt leg only 50 lbs x 15 Step on over and down WB on Rt leg 4 inch step x 15 with light single hand rail assist Seated driving pedal movement simulation with blazepod reactive red/green light pressure 30 sec x 4 with foot pressure on bosu ball  Verbal review of HEP from 1st visit.  Good recall was noted.   Neuro Re-ed Tandem stance 1 min x 1 bilateral occasional HHA (very limited) Tandem ambulation fwd/back in // bars occasional HHA 10 ft x 5 each way  TODAY'S TREATMENT                                                                          DATE:11/15/2022 Therex:    HEP instruction/performance c cues for techniques, handout provided.  Trial set performed of each for comprehension and symptom assessment.  See below for exercise list  PATIENT EDUCATION:  11/15/2022 Education details: HEP, POC Person educated: Patient Education method: Explanation, Demonstration, Verbal cues, and Handouts Education comprehension: verbalized understanding, returned demonstration, and verbal cues required  HOME EXERCISE PROGRAM: Access Code: [email or web address] URL: [email or web address] Date: 11/15/2022 Prepared by: Chyrel Masson  Exercises - Seated Long Arc Quad  - 3-5 x daily - 7 x weekly - 1-2 sets - 10 reps - 2 hold - Seated Knee Flexion Extension AAROM with Overpressure  - 2-3 x daily - 7 x weekly - 1 sets - 5 reps - 10 hold - Seated Quad Set  - 3-5 x daily - 7 x weekly - 1 sets - 10 reps - 5 hold - Seated Straight Leg Heel Taps  - 1-2 x daily - 7 x weekly - 2-3 sets - 10 reps - Sit to Stand  - 3 x daily - 7 x weekly - 1 sets - 10 reps  ASSESSMENT:  CLINICAL IMPRESSION: Good recall of HEP.  Continued skilled PT services indicated at this time to improve WB strength control for daily activity including stairs.    OBJECTIVE IMPAIRMENTS: decreased activity tolerance, decreased balance, decreased coordination, decreased endurance, decreased mobility, difficulty walking, decreased ROM, decreased strength, impaired perceived functional ability, impaired flexibility, improper body mechanics, and pain.   ACTIVITY LIMITATIONS: carrying, lifting, bending, standing, stairs, transfers, and locomotion level  PARTICIPATION LIMITATIONS: interpersonal relationship, driving, community activity, and yard work  PERSONAL FACTORS:  Lt THA anterior 06/2021, multiple back surgeries. PMH includes anemia, anxiety, hx of kidney stones, graves disease, HTN, hypothyroidism, iron deficiency, lymphedema,   are also affecting patient's functional outcome.   REHAB POTENTIAL: Good  CLINICAL DECISION MAKING: Stable/uncomplicated  EVALUATION COMPLEXITY: Low   GOALS: Goals reviewed with patient? Yes  SHORT TERM GOALS: (target date for Short term goals are 3 weeks 12/06/2022)   1.  Patient will demonstrate independent use of home exercise program to maintain progress from in clinic treatments.  Goal status: Met  LONG TERM GOALS: (target dates for all long term goals are 10 weeks  01/24/2023 )   1. Patient will demonstrate/report pain at worst less than or equal to 2/10 to facilitate minimal limitation in daily activity secondary  to pain symptoms.  Goal status: New  2. Patient will demonstrate independent use of home exercise program to facilitate ability to maintain/progress functional gains from skilled physical therapy services.  Goal status: New   3. Patient will demonstrate FOTO outcome > or = 65 % to indicate reduced disability due to condition.  Goal status: New   4.  Patient will demonstrate Rt LE MMT 5/5 throughout to faciltiate usual transfers, stairs, squatting at [facility] for daily life.   Goal status: New   5.  Patient will demonstrate Rt knee AROM 0-110 deg to facilitate usual mobility for daily activity , transfers.  Goal status: New   6.  Patient will demonstrate ascending/descending stairs c reciprocal gait pattern with one rail for house navigation.  Goal status: New    PLAN:  PT FREQUENCY: 1-2x/week  PT DURATION: 10 weeks  PLANNED INTERVENTIONS: Therapeutic exercises, Therapeutic activity, Neuro Muscular re-education, Balance training, Gait training, Patient/Family education, Joint mobilization, Stair training, DME instructions, Dry Needling, Electrical stimulation, Traction, Cryotherapy, vasopneumatic deviceMoist heat, Taping, Ultrasound, Ionotophoresis /ml Dexamethasone, and aquatic therapy, Manual therapy.  All included unless contraindicated  PLAN FOR NEXT SESSION: Progressive strengthening, compliant surface balance.    Chyrel Masson, PT, DPT, OCS, ATC 11/22/22  2:12 PM

## 2022-11-24 ENCOUNTER — Encounter: Payer: Self-pay | Admitting: Rehabilitative and Restorative Service Providers"

## 2022-11-24 ENCOUNTER — Ambulatory Visit (INDEPENDENT_AMBULATORY_CARE_PROVIDER_SITE_OTHER): Payer: Medicare Other | Admitting: Rehabilitative and Restorative Service Providers"

## 2022-11-24 DIAGNOSIS — M25661 Stiffness of right knee, not elsewhere classified: Secondary | ICD-10-CM | POA: Diagnosis not present

## 2022-11-24 DIAGNOSIS — M6281 Muscle weakness (generalized): Secondary | ICD-10-CM | POA: Diagnosis not present

## 2022-11-24 DIAGNOSIS — R262 Difficulty in walking, not elsewhere classified: Secondary | ICD-10-CM

## 2022-11-24 DIAGNOSIS — M25561 Pain in right knee: Secondary | ICD-10-CM | POA: Diagnosis not present

## 2022-11-24 DIAGNOSIS — G8929 Other chronic pain: Secondary | ICD-10-CM

## 2022-11-24 NOTE — Therapy (Signed)
OUTPATIENT PHYSICAL THERAPY TREATMENT   Patient Name: Katie Gaines MRN: [id number] DOB:February 25, 1956, 67 y.o., female Today's Date: 11/24/2022  END OF SESSION:  PT End of Session - 11/24/22 1503     Visit Number 3    Number of Visits 20    Date for PT Re-Evaluation 01/24/23    Authorization Type Medicare    Progress Note Due on Visit 10    PT Start Time 1506    PT Stop Time 1545    PT Time Calculation (min) 39 min    Activity Tolerance Patient tolerated treatment well    Behavior During Therapy WFL for tasks assessed/performed               Past Medical History:  Diagnosis Date   Anemia    Anxiety disorder    Cancer    Common bile duct stone    Graves disease    History of kidney stones    Hypertension    Hypothyroidism    Iron deficiency    Lymphedema of leg    bilateral   Past Surgical History:  Procedure Laterality Date   ANTERIOR LAT LUMBAR FUSION N/A 08/26/2019   Procedure: ANTERIOR LATERAL LUMBAR INTERBODY FUSION, LUMBAR ONE LUMBAR TWO;  Surgeon: Donalee Citrin, MD;  Location: [facility] OR;  Service: Neurosurgery;  Laterality: N/A;  anterolateral   APPENDECTOMY     BACK SURGERY  1987-2007   X 10 Surgery   CHOLECYSTECTOMY     ENTEROSCOPY  11/10/2011   Procedure: ENTEROSCOPY;  Surgeon: Louis Meckel, MD;  Location: [facility];  Service: Endoscopy;  Laterality: N/A;   ERCP  07/19/2011   Procedure: ENDOSCOPIC RETROGRADE CHOLANGIOPANCREATOGRAPHY (ERCP);  Surgeon: Louis Meckel, MD;  Location: [facility];  Service: Endoscopy;  Laterality: N/A;   ESOPHAGOGASTRODUODENOSCOPY  09/2018   FRACTURE SURGERY Left 2018   left arm fracture   HOT HEMOSTASIS  11/10/2011   Procedure: HOT HEMOSTASIS (ARGON PLASMA COAGULATION/BICAP);  Surgeon: Louis Meckel, MD;  Location: [facility];  Service: Endoscopy;  Laterality: N/A;   TOTAL HIP ARTHROPLASTY Left 06/25/2021   Procedure: LEFT TOTAL HIP ARTHROPLASTY ANTERIOR APPROACH;  Surgeon: Kathryne Hitch, MD;  Location: [facility]  ORS;  Service: Orthopedics;  Laterality: Left;   TOTAL KNEE ARTHROPLASTY Right 04/26/2022   Procedure: RIGHT TOTAL KNEE ARTHROPLASTY;  Surgeon: Kathryne Hitch, MD;  Location: [facility] OR;  Service: Orthopedics;  Laterality: Right;   Patient Active Problem List   Diagnosis Date Noted   Arthritis of knee, right 04/26/2022   Status post total right knee replacement 04/26/2022   Status post left hip replacement 06/25/2021   Avascular necrosis of hip, left 06/24/2021   Fusion of spine of lumbar region 03/17/2021   Common peroneal neuropathy, left 06/02/2020   Dysesthesia 03/16/2020   Numbness 03/16/2020   Visual changes 03/16/2020   History of fusion of cervical spine 03/16/2020   History of fusion of lumbar spine 03/16/2020   Fatigue 11/29/2019   DDD (degenerative disc disease), lumbar 08/26/2019   Gastroesophageal reflux disease 06/17/2019   Vitamin D deficiency 06/17/2019   Irritable bowel syndrome with diarrhea 03/06/2019   Closed fracture of distal end of radius 10/04/2018   Injury of right wrist 09/13/2018   Low back pain 07/15/2016   Pain syndrome, chronic 09/09/2012   Postlaminectomy syndrome, not elsewhere classified 09/09/2012   Anemia 05/30/2012   Essential hypertension 02/13/2012   Chronic gastrointestinal bleeding 01/04/2012   Hypothyroidism 01/04/2012   Obstruction of bile duct 07/19/2011  Abnormal radiographic examination 07/19/2011   IRON DEFICIENCY 09/10/2009    PCP: Eartha Inch MD  REFERRING PROVIDER: Kathryne Hitch, MD  REFERRING DIAG: [phone] (ICD-10-CM) - History of total right knee replacement  THERAPY DIAG:  Chronic pain of right knee  Muscle weakness (generalized)  Difficulty in walking, not elsewhere classified  Stiffness of right knee, not elsewhere classified  Rationale for Evaluation and Treatment: Rehabilitation  ONSET DATE: 04/26/2022 Rt TKA  SUBJECTIVE:   SUBJECTIVE STATEMENT: She indicated no real trouble after  last visit with minimal soreness.  She reported feeling happy.  She stated she wasn't "there yet" but getting better.    PERTINENT HISTORY: Lt THA anterior 06/2021, L2-3 lumbar laminectomy/facetectomies/foraminotomies, PLIF L2-3, pedicle screw fixation L2-3, posterior lateral arthrodesis L2-3 on 8/10. PMH includes anemia, anxiety, hx of kidney stones, graves disease, HTN, hypothyroidism, iron deficiency, lymphedema,  PAIN:  NPRS scale: "very light" but not rated on number scale.  Pain location: Rt knee - reported complaints in ankle/shin as well Pain description: "caught feeling" achy, sharp pain at times Aggravating factors: bending knee, car transfers, stairs Relieving factors: rest  PRECAUTIONS: None  WEIGHT BEARING RESTRICTIONS: No  FALLS:  Has patient fallen in last 6 months? 1 fall without injury No fear of falling reported.    LIVING ENVIRONMENT: Lives in: House/apartment Stairs: two stairs with one rail on Rt going up  OCCUPATION: no work.   PLOF: Independent, garden/yard work, Rt hand dominant, walking, travel.  Has membership [employer]  PATIENT GOALS: Reduce pain, get knee better  OBJECTIVE:   PATIENT SURVEYS:  11/15/2022 FOTO intake:  59  predicted:  65  COGNITION: 11/15/2022 Overall cognitive status: WFL    SENSATION: 11/15/2022 Not tested  MUSCLE LENGTH: 11/15/2022 No specific testing today.  POSTURE:  11/15/2022 No Significant postural limitations  PALPATION: 11/15/2022 No specific tenderness reported today.   LOWER EXTREMITY ROM:   ROM Right 11/15/2022 Rt 11/24/2022  Hip flexion    Hip extension    Hip abduction    Hip adduction    Hip internal rotation    Hip external rotation    Knee flexion 102 AROM in supine heel slide  105 AROM in supine heel slide  Knee extension -4 AROM in LAQ   Ankle dorsiflexion    Ankle plantarflexion    Ankle inversion    Ankle eversion     (Blank rows = not tested)  LOWER EXTREMITY MMT:  MMT Right 11/15/2022  Left 11/15/2022  Hip flexion 5/5 5/5  Hip extension    Hip abduction    Hip adduction    Hip internal rotation    Hip external rotation    Knee flexion 5/5 5/5  Knee extension 5/5  34.8, 35.2 lbs 5/5  47.3, 46  Ankle dorsiflexion 5/5 5/5  Ankle plantarflexion    Ankle inversion    Ankle eversion     (Blank rows = not tested)  LOWER EXTREMITY SPECIAL TESTS:  11/15/2022 No specific testing today  FUNCTIONAL TESTS:  11/15/2022 18 inch chair transfer: Lt SLS: 3 seconds  Rt SLS: 7 seconds  GAIT: 11/15/2022 Independent ambulation in clinic on level surfaces.    TODAY'S TREATMENT  DATE:11/24/2022 Therex: Nustep Lvl 6 12 mins UE/LE Leg press double leg 100 lbs x 15, single leg only 50 lbs 2 x 15 Forward step up 6 inch x 15 , performed bilaterally Lateral step down 4 inch x 15 bilateral Seated Rt knee flexion c Lt leg overpressure 30 sec x 2 Seated Rt knee flexion stretch c foot on ground long duration stretch     TODAY'S TREATMENT                                                                          DATE:11/22/2022 Therex: Nustep Lvl 5 10 mins UE/LE Leg press double leg 100 lbs x 15, Rt leg only 50 lbs x 15 Step on over and down WB on Rt leg 4 inch step x 15 with light single hand rail assist Seated driving pedal movement simulation with blazepod reactive red/green light pressure 30 sec x 4 with foot pressure on bosu ball  Verbal review of HEP from 1st visit.  Good recall was noted.   Neuro Re-ed Tandem stance 1 min x 1 bilateral occasional HHA (very limited) Tandem ambulation fwd/back in // bars occasional HHA 10 ft x 5 each way  TODAY'S TREATMENT                                                                          DATE:11/15/2022 Therex:    HEP instruction/performance c cues for techniques, handout provided.  Trial set performed of each for comprehension and symptom assessment.  See below for  exercise list  PATIENT EDUCATION:  11/15/2022 Education details: HEP, POC Person educated: Patient Education method: Explanation, Demonstration, Verbal cues, and Handouts Education comprehension: verbalized understanding, returned demonstration, and verbal cues required  HOME EXERCISE PROGRAM: Access Code: [email or web address] URL: [email or web address] Date: 11/15/2022 Prepared by: Chyrel Masson  Exercises - Seated Long Arc Quad  - 3-5 x daily - 7 x weekly - 1-2 sets - 10 reps - 2 hold - Seated Knee Flexion Extension AAROM with Overpressure  - 2-3 x daily - 7 x weekly - 1 sets - 5 reps - 10 hold - Seated Quad Set  - 3-5 x daily - 7 x weekly - 1 sets - 10 reps - 5 hold - Seated Straight Leg Heel Taps  - 1-2 x daily - 7 x weekly - 2-3 sets - 10 reps - Sit to Stand  - 3 x daily - 7 x weekly - 1 sets - 10 reps  ASSESSMENT:  CLINICAL IMPRESSION: Good progression in strengthening in WB.  May benefit from start of HEP at gym with conjunction with in clinic care to continue to promote gains towards goals.   OBJECTIVE IMPAIRMENTS: decreased activity tolerance, decreased balance, decreased coordination, decreased endurance, decreased mobility, difficulty walking, decreased ROM, decreased strength, impaired perceived functional ability, impaired flexibility, improper body mechanics, and pain.   ACTIVITY LIMITATIONS: carrying, lifting, bending, standing, stairs, transfers, and locomotion level  PARTICIPATION LIMITATIONS: interpersonal relationship, driving,  community activity, and yard work  PERSONAL FACTORS:  Lt THA anterior 06/2021, multiple back surgeries. PMH includes anemia, anxiety, hx of kidney stones, graves disease, HTN, hypothyroidism, iron deficiency, lymphedema,   are also affecting patient's functional outcome.   REHAB POTENTIAL: Good  CLINICAL DECISION MAKING: Stable/uncomplicated  EVALUATION COMPLEXITY: Low   GOALS: Goals reviewed with patient? Yes  SHORT TERM GOALS:  (target date for Short term goals are 3 weeks 12/06/2022)   1.  Patient will demonstrate independent use of home exercise program to maintain progress from in clinic treatments.  Goal status: Met  LONG TERM GOALS: (target dates for all long term goals are 10 weeks  01/24/2023 )   1. Patient will demonstrate/report pain at worst less than or equal to 2/10 to facilitate minimal limitation in daily activity secondary to pain symptoms.  Goal status: New   2. Patient will demonstrate independent use of home exercise program to facilitate ability to maintain/progress functional gains from skilled physical therapy services.  Goal status: New   3. Patient will demonstrate FOTO outcome > or = 65 % to indicate reduced disability due to condition.  Goal status: New   4.  Patient will demonstrate Rt LE MMT 5/5 throughout to faciltiate usual transfers, stairs, squatting at [facility] for daily life.   Goal status: New   5.  Patient will demonstrate Rt knee AROM 0-110 deg to facilitate usual mobility for daily activity , transfers.  Goal status: New   6.  Patient will demonstrate ascending/descending stairs c reciprocal gait pattern with one rail for house navigation.  Goal status: New    PLAN:  PT FREQUENCY: 1-2x/week  PT DURATION: 10 weeks  PLANNED INTERVENTIONS: Therapeutic exercises, Therapeutic activity, Neuro Muscular re-education, Balance training, Gait training, Patient/Family education, Joint mobilization, Stair training, DME instructions, Dry Needling, Electrical stimulation, Traction, Cryotherapy, vasopneumatic deviceMoist heat, Taping, Ultrasound, Ionotophoresis 4mg /ml Dexamethasone, and aquatic therapy, Manual therapy.  All included unless contraindicated  PLAN FOR NEXT SESSION: how was gym activity?  Any flexion mobility gains, strengthening.    Chyrel Masson, PT, DPT, OCS, ATC 11/24/22  3:53 PM

## 2022-11-29 ENCOUNTER — Ambulatory Visit (INDEPENDENT_AMBULATORY_CARE_PROVIDER_SITE_OTHER): Payer: Medicare Other | Admitting: Physical Therapy

## 2022-11-29 DIAGNOSIS — R262 Difficulty in walking, not elsewhere classified: Secondary | ICD-10-CM | POA: Diagnosis not present

## 2022-11-29 DIAGNOSIS — M25561 Pain in right knee: Secondary | ICD-10-CM | POA: Diagnosis not present

## 2022-11-29 DIAGNOSIS — G8929 Other chronic pain: Secondary | ICD-10-CM

## 2022-11-29 DIAGNOSIS — M6281 Muscle weakness (generalized): Secondary | ICD-10-CM | POA: Diagnosis not present

## 2022-11-29 DIAGNOSIS — M25661 Stiffness of right knee, not elsewhere classified: Secondary | ICD-10-CM

## 2022-11-29 NOTE — Therapy (Signed)
OUTPATIENT PHYSICAL THERAPY TREATMENT   Patient Name: Katie Gaines MRN: [id number] DOB:Sep 21, 1955, 67 y.o., female Today's Date: 11/29/2022  END OF SESSION:  PT End of Session - 11/29/22 1155     Visit Number 4    Number of Visits 20    Date for PT Re-Evaluation 01/24/23    Authorization Type Medicare    Progress Note Due on Visit 10    PT Start Time 1142    PT Stop Time 1222    PT Time Calculation (min) 40 min    Activity Tolerance Patient tolerated treatment well    Behavior During Therapy WFL for tasks assessed/performed                Past Medical History:  Diagnosis Date   Anemia    Anxiety disorder    Cancer    Common bile duct stone    Graves disease    History of kidney stones    Hypertension    Hypothyroidism    Iron deficiency    Lymphedema of leg    bilateral   Past Surgical History:  Procedure Laterality Date   ANTERIOR LAT LUMBAR FUSION N/A 08/26/2019   Procedure: ANTERIOR LATERAL LUMBAR INTERBODY FUSION, LUMBAR ONE LUMBAR TWO;  Surgeon: Donalee Citrin, MD;  Location: [facility] OR;  Service: Neurosurgery;  Laterality: N/A;  anterolateral   APPENDECTOMY     BACK SURGERY  1987-2007   X 10 Surgery   CHOLECYSTECTOMY     ENTEROSCOPY  11/10/2011   Procedure: ENTEROSCOPY;  Surgeon: Louis Meckel, MD;  Location: [facility] ENDOSCOPY;  Service: Endoscopy;  Laterality: N/A;   ERCP  07/19/2011   Procedure: ENDOSCOPIC RETROGRADE CHOLANGIOPANCREATOGRAPHY (ERCP);  Surgeon: Louis Meckel, MD;  Location: [facility];  Service: Endoscopy;  Laterality: N/A;   ESOPHAGOGASTRODUODENOSCOPY  09/2018   FRACTURE SURGERY Left 2018   left arm fracture   HOT HEMOSTASIS  11/10/2011   Procedure: HOT HEMOSTASIS (ARGON PLASMA COAGULATION/BICAP);  Surgeon: Louis Meckel, MD;  Location: [facility];  Service: Endoscopy;  Laterality: N/A;   TOTAL HIP ARTHROPLASTY Left 06/25/2021   Procedure: LEFT TOTAL HIP ARTHROPLASTY ANTERIOR APPROACH;  Surgeon: Kathryne Hitch, MD;  Location: [facility]  ORS;  Service: Orthopedics;  Laterality: Left;   TOTAL KNEE ARTHROPLASTY Right 04/26/2022   Procedure: RIGHT TOTAL KNEE ARTHROPLASTY;  Surgeon: Kathryne Hitch, MD;  Location: [facility] OR;  Service: Orthopedics;  Laterality: Right;   Patient Active Problem List   Diagnosis Date Noted   Arthritis of knee, right 04/26/2022   Status post total right knee replacement 04/26/2022   Status post left hip replacement 06/25/2021   Avascular necrosis of hip, left 06/24/2021   Fusion of spine of lumbar region 03/17/2021   Common peroneal neuropathy, left 06/02/2020   Dysesthesia 03/16/2020   Numbness 03/16/2020   Visual changes 03/16/2020   History of fusion of cervical spine 03/16/2020   History of fusion of lumbar spine 03/16/2020   Fatigue 11/29/2019   DDD (degenerative disc disease), lumbar 08/26/2019   Gastroesophageal reflux disease 06/17/2019   Vitamin D deficiency 06/17/2019   Irritable bowel syndrome with diarrhea 03/06/2019   Closed fracture of distal end of radius 10/04/2018   Injury of right wrist 09/13/2018   Low back pain 07/15/2016   Pain syndrome, chronic 09/09/2012   Postlaminectomy syndrome, not elsewhere classified 09/09/2012   Anemia 05/30/2012   Essential hypertension 02/13/2012   Chronic gastrointestinal bleeding 01/04/2012   Hypothyroidism 01/04/2012   Obstruction of bile duct 07/19/2011  Abnormal radiographic examination 07/19/2011   IRON DEFICIENCY 09/10/2009    PCP: Eartha Inch MD  REFERRING PROVIDER: Kathryne Hitch, MD  REFERRING DIAG: [phone] (ICD-10-CM) - History of total right knee replacement  THERAPY DIAG:  Chronic pain of right knee  Muscle weakness (generalized)  Difficulty in walking, not elsewhere classified  Stiffness of right knee, not elsewhere classified  Rationale for Evaluation and Treatment: Rehabilitation  ONSET DATE: 04/26/2022 Rt TKA  SUBJECTIVE:   SUBJECTIVE STATEMENT: She indicated she may have strained her  groin area gardening. She has been to the gym and this went okay.   PERTINENT HISTORY: Lt THA anterior 06/2021, L2-3 lumbar laminectomy/facetectomies/foraminotomies, PLIF L2-3, pedicle screw fixation L2-3, posterior lateral arthrodesis L2-3 on 8/10. PMH includes anemia, anxiety, hx of kidney stones, graves disease, HTN, hypothyroidism, iron deficiency, lymphedema,  PAIN:  NPRS scale: 5/10 pain  Pain location: Rt knee - reported complaints in ankle/shin as well Pain description: "caught feeling" achy, sharp pain at times Aggravating factors: bending knee, car transfers, stairs Relieving factors: rest  PRECAUTIONS: None  WEIGHT BEARING RESTRICTIONS: No  FALLS:  Has patient fallen in last 6 months? 1 fall without injury No fear of falling reported.    LIVING ENVIRONMENT: Lives in: House/apartment Stairs: two stairs with one rail on Rt going up  OCCUPATION: no work.   PLOF: Independent, garden/yard work, Rt hand dominant, walking, travel.  Has membership [employer]  PATIENT GOALS: Reduce pain, get knee better  OBJECTIVE:   PATIENT SURVEYS:  11/15/2022 FOTO intake:  59  predicted:  65  COGNITION: 11/15/2022 Overall cognitive status: WFL    SENSATION: 11/15/2022 Not tested  MUSCLE LENGTH: 11/15/2022 No specific testing today.  POSTURE:  11/15/2022 No Significant postural limitations  PALPATION: 11/15/2022 No specific tenderness reported today.   LOWER EXTREMITY ROM:   ROM Right 11/15/2022 Rt 11/24/2022  Hip flexion    Hip extension    Hip abduction    Hip adduction    Hip internal rotation    Hip external rotation    Knee flexion 102 AROM in supine heel slide  105 AROM in supine heel slide  Knee extension -4 AROM in LAQ   Ankle dorsiflexion    Ankle plantarflexion    Ankle inversion    Ankle eversion     (Blank rows = not tested)  LOWER EXTREMITY MMT:  MMT Right 11/15/2022 Left 11/15/2022  Hip flexion 5/5 5/5  Hip extension    Hip abduction    Hip adduction     Hip internal rotation    Hip external rotation    Knee flexion 5/5 5/5  Knee extension 5/5  34.8, 35.2 lbs 5/5  47.3, 46  Ankle dorsiflexion 5/5 5/5  Ankle plantarflexion    Ankle inversion    Ankle eversion     (Blank rows = not tested)  LOWER EXTREMITY SPECIAL TESTS:  11/15/2022 No specific testing today  FUNCTIONAL TESTS:  11/15/2022 18 inch chair transfer: Lt SLS: 3 seconds  Rt SLS: 7 seconds  GAIT: 11/15/2022 Independent ambulation in clinic on level surfaces.   TODAY'S TREATMENT  DATE:4/232024 Therex: Recumbent bike L2 X 5 min (full revolutions) Nustep Lvl 6 10 mins UE/LE Seated knee extension machine 5# 2X10 Seated knee flexion machine 20# 2X10 Leg press double leg 75 lbs x 15, single leg only 25 lbs 2 x 15 (we had to drop weight with this today as she did this last and was fatigued) Seated Rt knee flexion c Lt leg overpressure 10 sec x 5    TODAY'S TREATMENT                                                                          DATE:11/24/2022 Therex: Nustep Lvl 6 12 mins UE/LE Leg press double leg 100 lbs x 15, single leg only 50 lbs 2 x 15 Forward step up 6 inch x 15 , performed bilaterally Lateral step down 4 inch x 15 bilateral Seated Rt knee flexion c Lt leg overpressure 30 sec x 2 Seated Rt knee flexion stretch c foot on ground long duration stretch     TODAY'S TREATMENT                                                                          DATE:11/22/2022 Therex: Nustep Lvl 5 10 mins UE/LE Leg press double leg 100 lbs x 15, Rt leg only 50 lbs x 15 Step on over and down WB on Rt leg 4 inch step x 15 with light single hand rail assist Seated driving pedal movement simulation with blazepod reactive red/green light pressure 30 sec x 4 with foot pressure on bosu ball  Verbal review of HEP from 1st visit.  Good recall was noted.   Neuro Re-ed Tandem stance 1 min x 1 bilateral  occasional HHA (very limited) Tandem ambulation fwd/back in // bars occasional HHA 10 ft x 5 each way  TODAY'S TREATMENT                                                                          DATE:11/15/2022 Therex:    HEP instruction/performance c cues for techniques, handout provided.  Trial set performed of each for comprehension and symptom assessment.  See below for exercise list  PATIENT EDUCATION:  11/15/2022 Education details: HEP, POC Person educated: Patient Education method: Explanation, Demonstration, Verbal cues, and Handouts Education comprehension: verbalized understanding, returned demonstration, and verbal cues required  HOME EXERCISE PROGRAM: Access Code: [email or web address] URL: [email or web address] Date: 11/29/2022 Prepared by: Ivery Quale  Exercises - Seated Long Arc Quad  - 3-5 x daily - 7 x weekly - 1-2 sets - 10 reps - 2 hold - Seated Knee Flexion Extension AAROM with Overpressure  - 2-3 x daily - 7 x weekly - 1 sets - 5  reps - 10 hold - Seated Quad Set  - 3-5 x daily - 7 x weekly - 1 sets - 10 reps - 5 hold - Seated Straight Leg Heel Taps  - 1-2 x daily - 7 x weekly - 2-3 sets - 10 reps - Sit to Stand  - 3 x daily - 7 x weekly - 1 sets - 10 reps - Knee Extension with Weight Machine  - 1 x daily - 3 x weekly - 2-3 sets - 10-15 reps - Hamstring Curl with Weight Machine  - 1 x daily - 3 x weekly - 2-3 sets - 10-15 reps - Full Leg Press  - 1 x daily - 3 x weekly - 2-3 sets - 10-15 reps - Single Leg Press  - 1 x daily - 3 x weekly - 2-3 sets - 10-15 reps  ASSESSMENT:  CLINICAL IMPRESSION: I showed her additional gym equipment that would be good for her to use at the [facility] where she is a member. I printed out these with instructions, reps, sets and weight to start with however cautioned her that machines  different from our machines  may require a different weight. PT recommending to continue with current plan.   OBJECTIVE IMPAIRMENTS: decreased activity  tolerance, decreased balance, decreased coordination, decreased endurance, decreased mobility, difficulty walking, decreased ROM, decreased strength, impaired perceived functional ability, impaired flexibility, improper body mechanics, and pain.   ACTIVITY LIMITATIONS: carrying, lifting, bending, standing, stairs, transfers, and locomotion level  PARTICIPATION LIMITATIONS: interpersonal relationship, driving, community activity, and yard work  PERSONAL FACTORS:  Lt THA anterior 06/2021, multiple back surgeries. PMH includes anemia, anxiety, hx of kidney stones, graves disease, HTN, hypothyroidism, iron deficiency, lymphedema,   are also affecting patient's functional outcome.   REHAB POTENTIAL: Good  CLINICAL DECISION MAKING: Stable/uncomplicated  EVALUATION COMPLEXITY: Low   GOALS: Goals reviewed with patient? Yes  SHORT TERM GOALS: (target date for Short term goals are 3 weeks 12/06/2022)   1.  Patient will demonstrate independent use of home exercise program to maintain progress from in clinic treatments.  Goal status: Met  LONG TERM GOALS: (target dates for all long term goals are 10 weeks  01/24/2023 )   1. Patient will demonstrate/report pain at worst less than or equal to 2/10 to facilitate minimal limitation in daily activity secondary to pain symptoms.  Goal status: New   2. Patient will demonstrate independent use of home exercise program to facilitate ability to maintain/progress functional gains from skilled physical therapy services.  Goal status: New   3. Patient will demonstrate FOTO outcome > or = 65 % to indicate reduced disability due to condition.  Goal status: New   4.  Patient will demonstrate Rt LE MMT 5/5 throughout to faciltiate usual transfers, stairs, squatting at [facility] for daily life.   Goal status: New   5.  Patient will demonstrate Rt knee AROM 0-110 deg to facilitate usual mobility for daily activity , transfers.  Goal status: New   6.  Patient  will demonstrate ascending/descending stairs c reciprocal gait pattern with one rail for house navigation.  Goal status: New    PLAN:  PT FREQUENCY: 1-2x/week  PT DURATION: 10 weeks  PLANNED INTERVENTIONS: Therapeutic exercises, Therapeutic activity, Neuro Muscular re-education, Balance training, Gait training, Patient/Family education, Joint mobilization, Stair training, DME instructions, Dry Needling, Electrical stimulation, Traction, Cryotherapy, vasopneumatic deviceMoist heat, Taping, Ultrasound, Ionotophoresis /ml Dexamethasone, and aquatic therapy, Manual therapy.  All included unless contraindicated  PLAN FOR NEXT SESSION:  work toward [employer] and Any flexion mobility gains, strengthening.   Ivery Quale, PT, DPT 11/29/22 11:59 AM

## 2022-12-05 ENCOUNTER — Encounter: Payer: Self-pay | Admitting: Rehabilitative and Restorative Service Providers"

## 2022-12-05 ENCOUNTER — Ambulatory Visit (INDEPENDENT_AMBULATORY_CARE_PROVIDER_SITE_OTHER): Payer: Medicare Other | Admitting: Rehabilitative and Restorative Service Providers"

## 2022-12-05 DIAGNOSIS — R262 Difficulty in walking, not elsewhere classified: Secondary | ICD-10-CM | POA: Diagnosis not present

## 2022-12-05 DIAGNOSIS — M25661 Stiffness of right knee, not elsewhere classified: Secondary | ICD-10-CM | POA: Diagnosis not present

## 2022-12-05 DIAGNOSIS — M25561 Pain in right knee: Secondary | ICD-10-CM

## 2022-12-05 DIAGNOSIS — G8929 Other chronic pain: Secondary | ICD-10-CM

## 2022-12-05 DIAGNOSIS — M6281 Muscle weakness (generalized): Secondary | ICD-10-CM | POA: Diagnosis not present

## 2022-12-05 NOTE — Therapy (Signed)
OUTPATIENT PHYSICAL THERAPY TREATMENT   Patient Name: Katie Gaines MRN: [id number] DOB:[birth year], 67 y.o., female Today's Date: 12/05/2022  END OF SESSION:  PT End of Session - 12/05/22 1419     Visit Number 5    Number of Visits 20    Date for PT Re-Evaluation 01/24/23    Authorization Type Medicare    Progress Note Due on Visit 10    PT Start Time 1417    PT Stop Time 1456    PT Time Calculation (min) 39 min    Activity Tolerance Patient tolerated treatment well    Behavior During Therapy WFL for tasks assessed/performed                 Past Medical History:  Diagnosis Date   Anemia    Anxiety disorder    Cancer (HCC)    Common bile duct stone    Graves disease    History of kidney stones    Hypertension    Hypothyroidism    Iron deficiency    Lymphedema of leg    bilateral   Past Surgical History:  Procedure Laterality Date   ANTERIOR LAT LUMBAR FUSION N/A 08/26/2019   Procedure: ANTERIOR LATERAL LUMBAR INTERBODY FUSION, LUMBAR ONE LUMBAR TWO;  Surgeon: Donalee Citrin, MD;  Location: [facility] OR;  Service: Neurosurgery;  Laterality: N/A;  anterolateral   APPENDECTOMY     BACK SURGERY  1987-2007   X 10 Surgery   CHOLECYSTECTOMY     ENTEROSCOPY  11/10/2011   Procedure: ENTEROSCOPY;  Surgeon: Louis Meckel, MD;  Location: [facility];  Service: Endoscopy;  Laterality: N/A;   ERCP  07/19/2011   Procedure: ENDOSCOPIC RETROGRADE CHOLANGIOPANCREATOGRAPHY (ERCP);  Surgeon: Louis Meckel, MD;  Location: [facility];  Service: Endoscopy;  Laterality: N/A;   ESOPHAGOGASTRODUODENOSCOPY  09/2018   FRACTURE SURGERY Left 2018   left arm fracture   HOT HEMOSTASIS  11/10/2011   Procedure: HOT HEMOSTASIS (ARGON PLASMA COAGULATION/BICAP);  Surgeon: Louis Meckel, MD;  Location: [facility];  Service: Endoscopy;  Laterality: N/A;   TOTAL HIP ARTHROPLASTY Left 06/25/2021   Procedure: LEFT TOTAL HIP ARTHROPLASTY ANTERIOR APPROACH;  Surgeon: Kathryne Hitch, MD;   Location: [facility] ORS;  Service: Orthopedics;  Laterality: Left;   TOTAL KNEE ARTHROPLASTY Right 04/26/2022   Procedure: RIGHT TOTAL KNEE ARTHROPLASTY;  Surgeon: Kathryne Hitch, MD;  Location: [facility] OR;  Service: Orthopedics;  Laterality: Right;   Patient Active Problem List   Diagnosis Date Noted   Arthritis of knee, right 04/26/2022   Status post total right knee replacement 04/26/2022   Status post left hip replacement 06/25/2021   Avascular necrosis of hip, left (HCC) 06/24/2021   Fusion of spine of lumbar region 03/17/2021   Common peroneal neuropathy, left 06/02/2020   Dysesthesia 03/16/2020   Numbness 03/16/2020   Visual changes 03/16/2020   History of fusion of cervical spine 03/16/2020   History of fusion of lumbar spine 03/16/2020   Fatigue 11/29/2019   DDD (degenerative disc disease), lumbar 08/26/2019   Gastroesophageal reflux disease 06/17/2019   Vitamin D deficiency 06/17/2019   Irritable bowel syndrome with diarrhea 03/06/2019   Closed fracture of distal end of radius 10/04/2018   Injury of right wrist 09/13/2018   Low back pain 07/15/2016   Pain syndrome, chronic 09/09/2012   Postlaminectomy syndrome, not elsewhere classified 09/09/2012   Anemia 05/30/2012   Essential hypertension 02/13/2012   Chronic gastrointestinal bleeding 01/04/2012   Hypothyroidism 01/04/2012   Obstruction of  bile duct 07/19/2011   Abnormal radiographic examination 07/19/2011   IRON DEFICIENCY 09/10/2009    PCP: Eartha Inch MD  REFERRING PROVIDER: Kathryne Hitch, MD  REFERRING DIAG: [phone] (ICD-10-CM) - History of total right knee replacement  THERAPY DIAG:  Chronic pain of right knee  Muscle weakness (generalized)  Difficulty in walking, not elsewhere classified  Stiffness of right knee, not elsewhere classified  Rationale for Evaluation and Treatment: Rehabilitation  ONSET DATE: 04/26/2022 Rt TKA  SUBJECTIVE:   SUBJECTIVE STATEMENT: She arrived stating  feeling "bent over" from back today.  She mentioned feeling some complaints in legs after lots of steps at family members place. She reported going to the gym 2 x since last visit.    PERTINENT HISTORY: Lt THA anterior 06/2021, L2-3 lumbar laminectomy/facetectomies/foraminotomies, PLIF L2-3, pedicle screw fixation L2-3, posterior lateral arthrodesis L2-3 on 8/10. PMH includes anemia, anxiety, hx of kidney stones, graves disease, HTN, hypothyroidism, iron deficiency, lymphedema,  PAIN:  NPRS scale: 4/10 at worst today Pain location: Rt knee - reported complaints in ankle/shin as well Pain description: "caught feeling" achy, sharp pain at times Aggravating factors: bending knee, car transfers, stairs Relieving factors: rest  PRECAUTIONS: None  WEIGHT BEARING RESTRICTIONS: No  FALLS:  Has patient fallen in last 6 months? 1 fall without injury No fear of falling reported.    LIVING ENVIRONMENT: Lives in: House/apartment Stairs: two stairs with one rail on Rt going up  OCCUPATION: no work.   PLOF: Independent, garden/yard work, Rt hand dominant, walking, travel.  Has membership [employer]  PATIENT GOALS: Reduce pain, get knee better  OBJECTIVE:   PATIENT SURVEYS:  11/15/2022 FOTO intake:  59  predicted:  65  COGNITION: 11/15/2022 Overall cognitive status: WFL    SENSATION: 11/15/2022 Not tested  MUSCLE LENGTH: 11/15/2022 No specific testing today.  POSTURE:  11/15/2022 No Significant postural limitations  PALPATION: 11/15/2022 No specific tenderness reported today.   LUMBAR ROM: 12/05/2022  : 25 % WFL c end range pain  LOWER EXTREMITY ROM:   ROM Right 11/15/2022 Rt 11/24/2022   Hip flexion     Hip extension     Hip abduction     Hip adduction     Hip internal rotation     Hip external rotation     Knee flexion 102 AROM in supine heel slide  105 AROM in supine heel slide   Knee extension -4 AROM in LAQ    Ankle dorsiflexion     Ankle plantarflexion     Ankle inversion      Ankle eversion      (Blank rows = not tested)  LOWER EXTREMITY MMT:  MMT Right 11/15/2022 Left 11/15/2022 Right 12/05/2022  Hip flexion 5/5 5/5   Hip extension     Hip abduction     Hip adduction     Hip internal rotation     Hip external rotation     Knee flexion 5/5 5/5   Knee extension 5/5  34.8, 35.2 lbs 5/5  47.3, 46 5/5 36.3, 35 lbs  Ankle dorsiflexion 5/5 5/5   Ankle plantarflexion     Ankle inversion     Ankle eversion      (Blank rows = not tested)  LOWER EXTREMITY SPECIAL TESTS:  11/15/2022 No specific testing today  FUNCTIONAL TESTS:  11/15/2022 18 inch chair transfer: Lt SLS: 3 seconds  Rt SLS: 7 seconds  GAIT: 11/15/2022 Independent ambulation in clinic on level surfaces.   TODAY'S TREATMENT  DATE:4/292024 Therex: Nustep Lvl 6 10 mins UE/LE Seated knee extension machine 5# 2X10 Leg press double leg 75 lbs x 20, single leg 2 x 15 31 lbs  Step on over and down 4 inch step with light hand rail assist single UE x 15 bilateral  Neuro Re-ed SLS on black mat with contralateral leg touching corner lightly x 8 bilateral Tandem stance 1 min x 1 bilateral on foam c occasional HHA  TODAY'S TREATMENT                                                                          DATE:4/232024 Therex: Recumbent bike L2 X 5 min (full revolutions) Nustep Lvl 6 10 mins UE/LE Seated knee extension machine 5# 2X10 Seated knee flexion machine 20# 2X10 Leg press double leg 75 lbs x 15, single leg only 25 lbs 2 x 15 (we had to drop weight with this today as she did this last and was fatigued) Seated Rt knee flexion c Lt leg overpressure 10 sec x 5    TODAY'S TREATMENT                                                                          DATE:11/24/2022 Therex: Nustep Lvl 6 12 mins UE/LE Leg press double leg 100 lbs x 15, single leg only 50 lbs 2 x 15 Forward step up 6 inch x 15 , performed  bilaterally Lateral step down 4 inch x 15 bilateral Seated Rt knee flexion c Lt leg overpressure 30 sec x 2 Seated Rt knee flexion stretch c foot on ground long duration stretch     PATIENT EDUCATION:  11/15/2022 Education details: HEP, POC Person educated: Patient Education method: Programmer, multimedia, Facilities manager, Verbal cues, and Handouts Education comprehension: verbalized understanding, returned demonstration, and verbal cues required  HOME EXERCISE PROGRAM: Access Code: [email or web address] URL: [email or web address] Date: 11/29/2022 Prepared by: Ivery Quale  Exercises - Seated Long Arc Quad  - 3-5 x daily - 7 x weekly - 1-2 sets - 10 reps - 2 hold - Seated Knee Flexion Extension AAROM with Overpressure  - 2-3 x daily - 7 x weekly - 1 sets - 5 reps - 10 hold - Seated Quad Set  - 3-5 x daily - 7 x weekly - 1 sets - 10 reps - 5 hold - Seated Straight Leg Heel Taps  - 1-2 x daily - 7 x weekly - 2-3 sets - 10 reps - Sit to Stand  - 3 x daily - 7 x weekly - 1 sets - 10 reps - Knee Extension with Weight Machine  - 1 x daily - 3 x weekly - 2-3 sets - 10-15 reps - Hamstring Curl with Weight Machine  - 1 x daily - 3 x weekly - 2-3 sets - 10-15 reps - Full Leg Press  - 1 x daily - 3 x weekly - 2-3 sets - 10-15 reps - Single Leg Press  - 1 x daily -  3 x weekly - 2-3 sets - 10-15 reps  ASSESSMENT:  CLINICAL IMPRESSION: Eccentric step down control lacking on both legs but Rt > Lt with discomfort noted in movement.  Fair control on compliant surface balance. Continued strengthening and balance control to improve stability in activity and functional movements such as stairs.   Focus on quad strengthening.  .   OBJECTIVE IMPAIRMENTS: decreased activity tolerance, decreased balance, decreased coordination, decreased endurance, decreased mobility, difficulty walking, decreased ROM, decreased strength, impaired perceived functional ability, impaired flexibility, improper body mechanics, and pain.    ACTIVITY LIMITATIONS: carrying, lifting, bending, standing, stairs, transfers, and locomotion level  PARTICIPATION LIMITATIONS: interpersonal relationship, driving, community activity, and yard work  PERSONAL FACTORS:  Lt THA anterior 06/2021, multiple back surgeries. PMH includes anemia, anxiety, hx of kidney stones, graves disease, HTN, hypothyroidism, iron deficiency, lymphedema,   are also affecting patient's functional outcome.   REHAB POTENTIAL: Good  CLINICAL DECISION MAKING: Stable/uncomplicated  EVALUATION COMPLEXITY: Low   GOALS: Goals reviewed with patient? Yes  SHORT TERM GOALS: (target date for Short term goals are 3 weeks 12/06/2022)   1.  Patient will demonstrate independent use of home exercise program to maintain progress from in clinic treatments.  Goal status: Met  LONG TERM GOALS: (target dates for all long term goals are 10 weeks  01/24/2023 )   1. Patient will demonstrate/report pain at worst less than or equal to 2/10 to facilitate minimal limitation in daily activity secondary to pain symptoms.  Goal status: New   2. Patient will demonstrate independent use of home exercise program to facilitate ability to maintain/progress functional gains from skilled physical therapy services.  Goal status: New   3. Patient will demonstrate FOTO outcome > or = 65 % to indicate reduced disability due to condition.  Goal status: New   4.  Patient will demonstrate Rt LE MMT 5/5 throughout to faciltiate usual transfers, stairs, squatting at [facility] for daily life.   Goal status: New   5.  Patient will demonstrate Rt knee AROM 0-110 deg to facilitate usual mobility for daily activity , transfers.  Goal status: New   6.  Patient will demonstrate ascending/descending stairs c reciprocal gait pattern with one rail for house navigation.  Goal status: New    PLAN:  PT FREQUENCY: 1-2x/week  PT DURATION: 10 weeks  PLANNED INTERVENTIONS: Therapeutic exercises,  Therapeutic activity, Neuro Muscular re-education, Balance training, Gait training, Patient/Family education, Joint mobilization, Stair training, DME instructions, Dry Needling, Electrical stimulation, Traction, Cryotherapy, vasopneumatic deviceMoist heat, Taping, Ultrasound, Ionotophoresis 4mg /ml Dexamethasone, and aquatic therapy, Manual therapy.  All included unless contraindicated  PLAN FOR NEXT SESSION: Quad strengthening.   Chyrel Masson, PT, DPT, OCS, ATC 12/05/22  2:56 PM

## 2022-12-07 ENCOUNTER — Ambulatory Visit (INDEPENDENT_AMBULATORY_CARE_PROVIDER_SITE_OTHER): Payer: Medicare Other | Admitting: Rehabilitative and Restorative Service Providers"

## 2022-12-07 ENCOUNTER — Encounter: Payer: Self-pay | Admitting: Rehabilitative and Restorative Service Providers"

## 2022-12-07 DIAGNOSIS — M6281 Muscle weakness (generalized): Secondary | ICD-10-CM | POA: Diagnosis not present

## 2022-12-07 DIAGNOSIS — G8929 Other chronic pain: Secondary | ICD-10-CM

## 2022-12-07 DIAGNOSIS — M25561 Pain in right knee: Secondary | ICD-10-CM | POA: Diagnosis not present

## 2022-12-07 DIAGNOSIS — R262 Difficulty in walking, not elsewhere classified: Secondary | ICD-10-CM

## 2022-12-07 DIAGNOSIS — M25661 Stiffness of right knee, not elsewhere classified: Secondary | ICD-10-CM | POA: Diagnosis not present

## 2022-12-07 NOTE — Therapy (Signed)
OUTPATIENT PHYSICAL THERAPY TREATMENT   Patient Name: Katie Gaines MRN: [id number] DOB:[birth year], 67 y.o., female Today's Date: 12/07/2022  END OF SESSION:  PT End of Session - 12/07/22 1258     Visit Number 6    Number of Visits 20    Date for PT Re-Evaluation 01/24/23    Authorization Type Medicare    Progress Note Due on Visit 10    PT Start Time 1259    PT Stop Time 1339    PT Time Calculation (min) 40 min    Activity Tolerance Patient tolerated treatment well    Behavior During Therapy WFL for tasks assessed/performed                  Past Medical History:  Diagnosis Date   Anemia    Anxiety disorder    Cancer (HCC)    Common bile duct stone    Graves disease    History of kidney stones    Hypertension    Hypothyroidism    Iron deficiency    Lymphedema of leg    bilateral   Past Surgical History:  Procedure Laterality Date   ANTERIOR LAT LUMBAR FUSION N/A 08/26/2019   Procedure: ANTERIOR LATERAL LUMBAR INTERBODY FUSION, LUMBAR ONE LUMBAR TWO;  Surgeon: Donalee Citrin, MD;  Location: [facility] OR;  Service: Neurosurgery;  Laterality: N/A;  anterolateral   APPENDECTOMY     BACK SURGERY  1987-2007   X 10 Surgery   CHOLECYSTECTOMY     ENTEROSCOPY  11/10/2011   Procedure: ENTEROSCOPY;  Surgeon: Louis Meckel, MD;  Location: [facility]COPY;  Service: Endoscopy;  Laterality: N/A;   ERCP  07/19/2011   Procedure: ENDOSCOPIC RETROGRADE CHOLANGIOPANCREATOGRAPHY (ERCP);  Surgeon: Louis Meckel, MD;  Location: [facility];  Service: Endoscopy;  Laterality: N/A;   ESOPHAGOGASTRODUODENOSCOPY  09/2018   FRACTURE SURGERY Left 2018   left arm fracture   HOT HEMOSTASIS  11/10/2011   Procedure: HOT HEMOSTASIS (ARGON PLASMA COAGULATION/BICAP);  Surgeon: Louis Meckel, MD;  Location: [facility];  Service: Endoscopy;  Laterality: N/A;   TOTAL HIP ARTHROPLASTY Left 06/25/2021   Procedure: LEFT TOTAL HIP ARTHROPLASTY ANTERIOR APPROACH;  Surgeon: Kathryne Hitch, MD;   Location: [facility] ORS;  Service: Orthopedics;  Laterality: Left;   TOTAL KNEE ARTHROPLASTY Right 04/26/2022   Procedure: RIGHT TOTAL KNEE ARTHROPLASTY;  Surgeon: Kathryne Hitch, MD;  Location: [facility] OR;  Service: Orthopedics;  Laterality: Right;   Patient Active Problem List   Diagnosis Date Noted   Arthritis of knee, right 04/26/2022   Status post total right knee replacement 04/26/2022   Status post left hip replacement 06/25/2021   Avascular necrosis of hip, left (HCC) 06/24/2021   Fusion of spine of lumbar region 03/17/2021   Common peroneal neuropathy, left 06/02/2020   Dysesthesia 03/16/2020   Numbness 03/16/2020   Visual changes 03/16/2020   History of fusion of cervical spine 03/16/2020   History of fusion of lumbar spine 03/16/2020   Fatigue 11/29/2019   DDD (degenerative disc disease), lumbar 08/26/2019   Gastroesophageal reflux disease 06/17/2019   Vitamin D deficiency 06/17/2019   Irritable bowel syndrome with diarrhea 03/06/2019   Closed fracture of distal end of radius 10/04/2018   Injury of right wrist 09/13/2018   Low back pain 07/15/2016   Pain syndrome, chronic 09/09/2012   Postlaminectomy syndrome, not elsewhere classified 09/09/2012   Anemia 05/30/2012   Essential hypertension 02/13/2012   Chronic gastrointestinal bleeding 01/04/2012   Hypothyroidism 01/04/2012   Obstruction  of bile duct 07/19/2011   Abnormal radiographic examination 07/19/2011   IRON DEFICIENCY 09/10/2009    PCP: Eartha Inch MD  REFERRING PROVIDER: Kathryne Hitch, MD  REFERRING DIAG: [phone] (ICD-10-CM) - History of total right knee replacement  THERAPY DIAG:  Chronic pain of right knee  Muscle weakness (generalized)  Difficulty in walking, not elsewhere classified  Stiffness of right knee, not elsewhere classified  Rationale for Evaluation and Treatment: Rehabilitation  ONSET DATE: 04/26/2022 Rt TKA  SUBJECTIVE:   SUBJECTIVE STATEMENT: She indicated  feeling knee and shin to ankle hurting more in last few days.  She stated it wasn't as bad as it was in the past but more noted than the last few weeks.  She indicated this symptom has been off and on at times.     PERTINENT HISTORY: Lt THA anterior 06/2021, L2-3 lumbar laminectomy/facetectomies/foraminotomies, PLIF L2-3, pedicle screw fixation L2-3, posterior lateral arthrodesis L2-3 on 8/10. PMH includes anemia, anxiety, hx of kidney stones, graves disease, HTN, hypothyroidism, iron deficiency, lymphedema,  PAIN:  NPRS scale: 5-6/10 at worst today Pain location: Rt knee - reported complaints in ankle/shin as well Pain description: "caught feeling" achy, sharp pain at times Aggravating factors: bending knee, car transfers, stairs Relieving factors: rest  PRECAUTIONS: None  WEIGHT BEARING RESTRICTIONS: No  FALLS:  Has patient fallen in last 6 months? 1 fall without injury No fear of falling reported.    LIVING ENVIRONMENT: Lives in: House/apartment Stairs: two stairs with one rail on Rt going up  OCCUPATION: no work.   PLOF: Independent, garden/yard work, Rt hand dominant, walking, travel.  Has membership [employer]  PATIENT GOALS: Reduce pain, get knee better  OBJECTIVE:   PATIENT SURVEYS:  11/15/2022 FOTO intake:  59  predicted:  65  COGNITION: 11/15/2022 Overall cognitive status: WFL    SENSATION: 11/15/2022 Not tested  MUSCLE LENGTH: 11/15/2022 No specific testing today.  POSTURE:  11/15/2022 No Significant postural limitations  PALPATION: 11/15/2022 No specific tenderness reported today.   LUMBAR ROM: 12/07/2022:  Ranges assessed for Rt LE symptoms response to lumbar movements: Lumbar extension : 25 % WFL, repeated x 5 in standing with no change in distal LE symptoms   Lumbar flexion : to mid ankles without pain complaint changes for LE  12/05/2022  : 25 % WFL c end range pain  LOWER EXTREMITY ROM:   ROM Right 11/15/2022 Rt 11/24/2022   Hip flexion     Hip extension      Hip abduction     Hip adduction     Hip internal rotation     Hip external rotation     Knee flexion 102 AROM in supine heel slide  105 AROM in supine heel slide   Knee extension -4 AROM in LAQ    Ankle dorsiflexion     Ankle plantarflexion     Ankle inversion     Ankle eversion      (Blank rows = not tested)  LOWER EXTREMITY MMT:  MMT Right 11/15/2022 Left 11/15/2022 Right 12/05/2022  Hip flexion 5/5 5/5   Hip extension     Hip abduction     Hip adduction     Hip internal rotation     Hip external rotation     Knee flexion 5/5 5/5   Knee extension 5/5  34.8, 35.2 lbs 5/5  47.3, 46 5/5 36.3, 35 lbs  Ankle dorsiflexion 5/5 5/5   Ankle plantarflexion     Ankle inversion  Ankle eversion      (Blank rows = not tested)  LOWER EXTREMITY SPECIAL TESTS:  11/15/2022 No specific testing today  FUNCTIONAL TESTS:  11/15/2022 18 inch chair transfer: Lt SLS: 3 seconds  Rt SLS: 7 seconds  GAIT: 11/15/2022 Independent ambulation in clinic on level surfaces.   TODAY'S TREATMENT                                                                          DATE:12/07/2022 Therex: Nustep Lvl 6 12 mins UE/LE Standing lumbar extension AROM x 5 Standing lumbar flexion x 2 Incline gastroc stretch 30 sec x 3 bilateral (cues for home use on step) Seated SLR 2 x 15 bilateral  Neuro Re-ed Fitter rocker board fwd/back light touching double leg control x 20 c SBA  Alternating heel /toe lifts x 20 bilateral   TODAY'S TREATMENT                                                                          DATE:4/292024 Therex: Nustep Lvl 6 10 mins UE/LE Seated knee extension machine 5# 2X10 Leg press double leg 75 lbs x 20, single leg 2 x 15 31 lbs  Step on over and down 4 inch step with light hand rail assist single UE x 15 bilateral  Neuro Re-ed SLS on black mat with contralateral leg touching corner lightly x 8 bilateral Tandem stance 1 min x 1 bilateral on foam c occasional  HHA  TODAY'S TREATMENT                                                                          DATE:4/232024 Therex: Recumbent bike L2 X 5 min (full revolutions) Nustep Lvl 6 10 mins UE/LE Seated knee extension machine 5# 2X10 Seated knee flexion machine 20# 2X10 Leg press double leg 75 lbs x 15, single leg only 25 lbs 2 x 15 (we had to drop weight with this today as she did this last and was fatigued) Seated Rt knee flexion c Lt leg overpressure 10 sec x 5    PATIENT EDUCATION:  11/15/2022 Education details: HEP, POC Person educated: Patient Education method: Programmer, multimedia, Facilities manager, Verbal cues, and Handouts Education comprehension: verbalized understanding, returned demonstration, and verbal cues required  HOME EXERCISE PROGRAM: Access Code: [email or web address] URL: [email or web address] Date: 11/29/2022 Prepared by: Ivery Quale  Exercises - Seated Long Arc Quad  - 3-5 x daily - 7 x weekly - 1-2 sets - 10 reps - 2 hold - Seated Knee Flexion Extension AAROM with Overpressure  - 2-3 x daily - 7 x weekly - 1 sets - 5 reps - 10 hold - Seated Quad Set  - 3-5 x daily -  7 x weekly - 1 sets - 10 reps - 5 hold - Seated Straight Leg Heel Taps  - 1-2 x daily - 7 x weekly - 2-3 sets - 10 reps - Sit to Stand  - 3 x daily - 7 x weekly - 1 sets - 10 reps - Knee Extension with Weight Machine  - 1 x daily - 3 x weekly - 2-3 sets - 10-15 reps - Hamstring Curl with Weight Machine  - 1 x daily - 3 x weekly - 2-3 sets - 10-15 reps - Full Leg Press  - 1 x daily - 3 x weekly - 2-3 sets - 10-15 reps - Single Leg Press  - 1 x daily - 3 x weekly - 2-3 sets - 10-15 reps  ASSESSMENT:  CLINICAL IMPRESSION: Reassessment of movement from back did not centralize or peripheralize symptoms for Rt distal LE.   Difficulty in DF control on Rt leg vs. Lt leg.  Encouraged use of heel/toe lifts at home for balance and strength improvements.  Adjusted WB activity today to avoid aggravation of symptoms.  .    OBJECTIVE IMPAIRMENTS: decreased activity tolerance, decreased balance, decreased coordination, decreased endurance, decreased mobility, difficulty walking, decreased ROM, decreased strength, impaired perceived functional ability, impaired flexibility, improper body mechanics, and pain.   ACTIVITY LIMITATIONS: carrying, lifting, bending, standing, stairs, transfers, and locomotion level  PARTICIPATION LIMITATIONS: interpersonal relationship, driving, community activity, and yard work  PERSONAL FACTORS:  Lt THA anterior 06/2021, multiple back surgeries. PMH includes anemia, anxiety, hx of kidney stones, graves disease, HTN, hypothyroidism, iron deficiency, lymphedema,   are also affecting patient's functional outcome.   REHAB POTENTIAL: Good  CLINICAL DECISION MAKING: Stable/uncomplicated  EVALUATION COMPLEXITY: Low   GOALS: Goals reviewed with patient? Yes  SHORT TERM GOALS: (target date for Short term goals are 3 weeks 12/06/2022)   1.  Patient will demonstrate independent use of home exercise program to maintain progress from in clinic treatments.  Goal status: Met  LONG TERM GOALS: (target dates for all long term goals are 10 weeks  01/24/2023 )   1. Patient will demonstrate/report pain at worst less than or equal to 2/10 to facilitate minimal limitation in daily activity secondary to pain symptoms.  Goal status: New   2. Patient will demonstrate independent use of home exercise program to facilitate ability to maintain/progress functional gains from skilled physical therapy services.  Goal status: New   3. Patient will demonstrate FOTO outcome > or = 65 % to indicate reduced disability due to condition.  Goal status: New   4.  Patient will demonstrate Rt LE MMT 5/5 throughout to faciltiate usual transfers, stairs, squatting at [facility] for daily life.   Goal status: New   5.  Patient will demonstrate Rt knee AROM 0-110 deg to facilitate usual mobility for daily activity ,  transfers.  Goal status: New   6.  Patient will demonstrate ascending/descending stairs c reciprocal gait pattern with one rail for house navigation.  Goal status: New    PLAN:  PT FREQUENCY: 1-2x/week  PT DURATION: 10 weeks  PLANNED INTERVENTIONS: Therapeutic exercises, Therapeutic activity, Neuro Muscular re-education, Balance training, Gait training, Patient/Family education, Joint mobilization, Stair training, DME instructions, Dry Needling, Electrical stimulation, Traction, Cryotherapy, vasopneumatic deviceMoist heat, Taping, Ultrasound, Ionotophoresis 4mg /ml Dexamethasone, and aquatic therapy, Manual therapy.  All included unless contraindicated  PLAN FOR NEXT SESSION: Recheck symptom response to adjustments today.  Recheck leg strength knee, ankle.   Chyrel Masson, PT, DPT, OCS, ATC  12/07/22  1:36 PM

## 2022-12-12 ENCOUNTER — Encounter: Payer: Medicare Other | Admitting: Rehabilitative and Restorative Service Providers"

## 2022-12-14 ENCOUNTER — Encounter: Payer: Self-pay | Admitting: Rehabilitative and Restorative Service Providers"

## 2022-12-14 ENCOUNTER — Ambulatory Visit (INDEPENDENT_AMBULATORY_CARE_PROVIDER_SITE_OTHER): Payer: Medicare Other | Admitting: Rehabilitative and Restorative Service Providers"

## 2022-12-14 DIAGNOSIS — M25661 Stiffness of right knee, not elsewhere classified: Secondary | ICD-10-CM | POA: Diagnosis not present

## 2022-12-14 DIAGNOSIS — R262 Difficulty in walking, not elsewhere classified: Secondary | ICD-10-CM | POA: Diagnosis not present

## 2022-12-14 DIAGNOSIS — M25561 Pain in right knee: Secondary | ICD-10-CM

## 2022-12-14 DIAGNOSIS — G8929 Other chronic pain: Secondary | ICD-10-CM

## 2022-12-14 DIAGNOSIS — M6281 Muscle weakness (generalized): Secondary | ICD-10-CM | POA: Diagnosis not present

## 2022-12-14 NOTE — Therapy (Signed)
OUTPATIENT PHYSICAL THERAPY TREATMENT   Patient Name: Katie Gaines MRN: [id number] DOB:[birth year], 67 y.o., female Today's Date: 12/14/2022  END OF SESSION:  PT End of Session - 12/14/22 1300     Visit Number 7    Number of Visits 20    Date for PT Re-Evaluation 01/24/23    Authorization Type Medicare    Progress Note Due on Visit 10    PT Start Time 1257    PT Stop Time 1336    PT Time Calculation (min) 39 min    Activity Tolerance Patient tolerated treatment well    Behavior During Therapy WFL for tasks assessed/performed                   Past Medical History:  Diagnosis Date   Anemia    Anxiety disorder    Cancer (HCC)    Common bile duct stone    Graves disease    History of kidney stones    Hypertension    Hypothyroidism    Iron deficiency    Lymphedema of leg    bilateral   Past Surgical History:  Procedure Laterality Date   ANTERIOR LAT LUMBAR FUSION N/A 08/26/2019   Procedure: ANTERIOR LATERAL LUMBAR INTERBODY FUSION, LUMBAR ONE LUMBAR TWO;  Surgeon: Donalee Citrin, MD;  Location[facility];  Service: Neurosurgery;  Laterality: N/A;  anterolateral   APPENDECTOMY     BACK SURGERY  1987-2007   X 10 Surgery   CHOLECYSTECTOMY     ENTEROSCOPY  11/10/2011   Procedure: ENTEROSCOPY;  Surgeon: Louis Meckel, MD;  Location: [facility];  Service: Endoscopy;  Laterality: N/A;   ERCP  07/19/2011   Procedure: ENDOSCOPIC RETROGRADE CHOLANGIOPANCREATOGRAPHY (ERCP);  Surgeon: Louis Meckel, MD;  Location: [facility];  Service: Endoscopy;  Laterality: N/A;   ESOPHAGOGASTRODUODENOSCOPY  09/2018   FRACTURE SURGERY Left 2018   left arm fracture   HOT HEMOSTASIS  11/10/2011   Procedure: HOT HEMOSTASIS (ARGON PLASMA COAGULATION/BICAP);  Surgeon: Louis Meckel, MD;  Location: [facility];  Service: Endoscopy;  Laterality: N/A;   TOTAL HIP ARTHROPLASTY Left 06/25/2021   Procedure: LEFT TOTAL HIP ARTHROPLASTY ANTERIOR APPROACH;  Surgeon: Kathryne Hitch, MD;   Location: [facility];  Service: Orthopedics;  Laterality: Left;   TOTAL KNEE ARTHROPLASTY Right 04/26/2022   Procedure: RIGHT TOTAL KNEE ARTHROPLASTY;  Surgeon: Kathryne Hitch, MD;  Location: [facility] OR;  Service: Orthopedics;  Laterality: Right;   Patient Active Problem List   Diagnosis Date Noted   Arthritis of knee, right 04/26/2022   Status post total right knee replacement 04/26/2022   Status post left hip replacement 06/25/2021   Avascular necrosis of hip, left (HCC) 06/24/2021   Fusion of spine of lumbar region 03/17/2021   Common peroneal neuropathy, left 06/02/2020   Dysesthesia 03/16/2020   Numbness 03/16/2020   Visual changes 03/16/2020   History of fusion of cervical spine 03/16/2020   History of fusion of lumbar spine 03/16/2020   Fatigue 11/29/2019   DDD (degenerative disc disease), lumbar 08/26/2019   Gastroesophageal reflux disease 06/17/2019   Vitamin D deficiency 06/17/2019   Irritable bowel syndrome with diarrhea 03/06/2019   Closed fracture of distal end of radius 10/04/2018   Injury of right wrist 09/13/2018   Low back pain 07/15/2016   Pain syndrome, chronic 09/09/2012   Postlaminectomy syndrome, not elsewhere classified 09/09/2012   Anemia 05/30/2012   Essential hypertension 02/13/2012   Chronic gastrointestinal bleeding 01/04/2012   Hypothyroidism 01/04/2012  Obstruction of bile duct 07/19/2011   Abnormal radiographic examination 07/19/2011   IRON DEFICIENCY 09/10/2009    PCP: Eartha Inch MD  REFERRING PROVIDER: Kathryne Hitch, MD  REFERRING DIAG: [phone] (ICD-10-CM) - History of total right knee replacement  THERAPY DIAG:  Chronic pain of right knee  Muscle weakness (generalized)  Difficulty in walking, not elsewhere classified  Stiffness of right knee, not elsewhere classified  Rationale for Evaluation and Treatment: Rehabilitation  ONSET DATE: 04/26/2022 Rt TKA  SUBJECTIVE:   SUBJECTIVE STATEMENT: Pt indicated feeling  better in last few days.  She mentioned having the session at the gym with good feelings during and good recovery afterward.     PERTINENT HISTORY: Lt THA anterior 06/2021, L2-3 lumbar laminectomy/facetectomies/foraminotomies, PLIF L2-3, pedicle screw fixation L2-3, posterior lateral arthrodesis L2-3 on 8/10. PMH includes anemia, anxiety, hx of kidney stones, graves disease, HTN, hypothyroidism, iron deficiency, lymphedema,  PAIN:  NPRS scale: no specific symptoms indicated upon arrival.  Pain location: Rt knee - reported complaints in ankle/shin as well Pain description: "caught feeling" achy, sharp pain at times Aggravating factors: bending knee, car transfers, stairs Relieving factors: rest  PRECAUTIONS: None  WEIGHT BEARING RESTRICTIONS: No  FALLS:  Has patient fallen in last 6 months? 1 fall without injury No fear of falling reported.    LIVING ENVIRONMENT: Lives in: House/apartment Stairs: two stairs with one rail on Rt going up  OCCUPATION: no work.   PLOF: Independent, garden/yard work, Rt hand dominant, walking, travel.  Has membership [employer]  PATIENT GOALS: Reduce pain, get knee better  OBJECTIVE:   PATIENT SURVEYS:  12/14/2022:  FOTO update:  69  11/15/2022 FOTO intake:  59  predicted:  65  COGNITION: 11/15/2022 Overall cognitive status: WFL    SENSATION: 11/15/2022 Not tested  MUSCLE LENGTH: 11/15/2022 No specific testing today.  POSTURE:  11/15/2022 No Significant postural limitations  PALPATION: 11/15/2022 No specific tenderness reported today.   LUMBAR ROM: 12/07/2022:  Ranges assessed for Rt LE symptoms response to lumbar movements: Lumbar extension : 25 % WFL, repeated x 5 in standing with no change in distal LE symptoms   Lumbar flexion : to mid ankles without pain complaint changes for LE  12/05/2022  : 25 % WFL c end range pain  LOWER EXTREMITY ROM:   ROM Right 11/15/2022 Rt 11/24/2022   Hip flexion     Hip extension     Hip abduction     Hip  adduction     Hip internal rotation     Hip external rotation     Knee flexion 102 AROM in supine heel slide  105 AROM in supine heel slide   Knee extension -4 AROM in LAQ    Ankle dorsiflexion     Ankle plantarflexion     Ankle inversion     Ankle eversion      (Blank rows = not tested)  LOWER EXTREMITY MMT:  MMT Right 11/15/2022 Left 11/15/2022 Right 12/05/2022  Hip flexion 5/5 5/5   Hip extension     Hip abduction     Hip adduction     Hip internal rotation     Hip external rotation     Knee flexion 5/5 5/5   Knee extension 5/5  34.8, 35.2 lbs 5/5  47.3, 46 5/5 36.3, 35 lbs  Ankle dorsiflexion 5/5 5/5   Ankle plantarflexion     Ankle inversion     Ankle eversion      (Blank rows = not  tested)  LOWER EXTREMITY SPECIAL TESTS:  11/15/2022 No specific testing today  FUNCTIONAL TESTS:  11/15/2022 18 inch chair transfer: Lt SLS: 3 seconds  Rt SLS: 7 seconds  GAIT: 11/15/2022 Independent ambulation in clinic on level surfaces.   TODAY'S TREATMENT                                                                          DATE:12/14/2022 Therex: Nustep Lvl 6 12 mins UE/LE Leg press double leg 81 lbs x 20, single leg 2 x 15 31 lbs, performed bilaterally -cues for slow movement focus Incline gastroc stretch 30 sec x 3 bilateral (cues for home use on step)   Neuro Re-ed Fitter rocker board fwd/back light touching double leg control x 20 c SBA to min A at times Tandem stance on foam 1 min x 2 bilateral in // bars with occasional HHA Tandem ambulation fwd/back in // bars occasional HHA 10 ft x 4 each way  TODAY'S TREATMENT                                                                          DATE:12/07/2022 Therex: Nustep Lvl 6 12 mins UE/LE Standing lumbar extension AROM x 5 Standing lumbar flexion x 2 Incline gastroc stretch 30 sec x 3 bilateral (cues for home use on step) Seated SLR 2 x 15 bilateral  Neuro Re-ed Fitter rocker board fwd/back light touching double leg  control x 20 c SBA  Alternating heel /toe lifts x 20 bilateral   TODAY'S TREATMENT                                                                          DATE:4/292024 Therex: Nustep Lvl 6 10 mins UE/LE Seated knee extension machine 5# 2X10 Leg press double leg 75 lbs x 20, single leg 2 x 15 31 lbs  Step on over and down 4 inch step with light hand rail assist single UE x 15 bilateral  Neuro Re-ed SLS on black mat with contralateral leg touching corner lightly x 8 bilateral Tandem stance 1 min x 1 bilateral on foam c occasional HHA    PATIENT EDUCATION:  11/15/2022 Education details: HEP, POC Person educated: Patient Education method: Programmer, multimedia, Facilities manager, Verbal cues, and Handouts Education comprehension: verbalized understanding, returned demonstration, and verbal cues required  HOME EXERCISE PROGRAM: Access Code: [email or web address] URL: [email or web address] Date: 11/29/2022 Prepared by: Ivery Quale  Exercises - Seated Long Arc Quad  - 3-5 x daily - 7 x weekly - 1-2 sets - 10 reps - 2 hold - Seated Knee Flexion Extension AAROM with Overpressure  - 2-3 x daily - 7 x weekly - 1 sets - 5 reps -  10 hold - Seated Quad Set  - 3-5 x daily - 7 x weekly - 1 sets - 10 reps - 5 hold - Seated Straight Leg Heel Taps  - 1-2 x daily - 7 x weekly - 2-3 sets - 10 reps - Sit to Stand  - 3 x daily - 7 x weekly - 1 sets - 10 reps - Knee Extension with Weight Machine  - 1 x daily - 3 x weekly - 2-3 sets - 10-15 reps - Hamstring Curl with Weight Machine  - 1 x daily - 3 x weekly - 2-3 sets - 10-15 reps - Full Leg Press  - 1 x daily - 3 x weekly - 2-3 sets - 10-15 reps - Single Leg Press  - 1 x daily - 3 x weekly - 2-3 sets - 10-15 reps  ASSESSMENT:  CLINICAL IMPRESSION: FOTO improvement noted in reassessment today.   Improving strength as noted in control on leg press.  Poor to fair balance control noted today , specifically on compliant surface.  Continued emphasis on increased  gym usage.  .   OBJECTIVE IMPAIRMENTS: decreased activity tolerance, decreased balance, decreased coordination, decreased endurance, decreased mobility, difficulty walking, decreased ROM, decreased strength, impaired perceived functional ability, impaired flexibility, improper body mechanics, and pain.   ACTIVITY LIMITATIONS: carrying, lifting, bending, standing, stairs, transfers, and locomotion level  PARTICIPATION LIMITATIONS: interpersonal relationship, driving, community activity, and yard work  PERSONAL FACTORS:  Lt THA anterior 06/2021, multiple back surgeries. PMH includes anemia, anxiety, hx of kidney stones, graves disease, HTN, hypothyroidism, iron deficiency, lymphedema,   are also affecting patient's functional outcome.   REHAB POTENTIAL: Good  CLINICAL DECISION MAKING: Stable/uncomplicated  EVALUATION COMPLEXITY: Low   GOALS: Goals reviewed with patient? Yes  SHORT TERM GOALS: (target date for Short term goals are 3 weeks 12/06/2022)   1.  Patient will demonstrate independent use of home exercise program to maintain progress from in clinic treatments.  Goal status: Met  LONG TERM GOALS: (target dates for all long term goals are 10 weeks  01/24/2023 )   1. Patient will demonstrate/report pain at worst less than or equal to 2/10 to facilitate minimal limitation in daily activity secondary to pain symptoms.  Goal status: New   2. Patient will demonstrate independent use of home exercise program to facilitate ability to maintain/progress functional gains from skilled physical therapy services.  Goal status: New   3. Patient will demonstrate FOTO outcome > or = 65 % to indicate reduced disability due to condition.  Goal status: New   4.  Patient will demonstrate Rt LE MMT 5/5 throughout to faciltiate usual transfers, stairs, squatting at [facility] for daily life.   Goal status: New   5.  Patient will demonstrate Rt knee AROM 0-110 deg to facilitate usual mobility for  daily activity , transfers.  Goal status: New   6.  Patient will demonstrate ascending/descending stairs c reciprocal gait pattern with one rail for house navigation.  Goal status: New    PLAN:  PT FREQUENCY: 1-2x/week  PT DURATION: 10 weeks  PLANNED INTERVENTIONS: Therapeutic exercises, Therapeutic activity, Neuro Muscular re-education, Balance training, Gait training, Patient/Family education, Joint mobilization, Stair training, DME instructions, Dry Needling, Electrical stimulation, Traction, Cryotherapy, vasopneumatic deviceMoist heat, Taping, Ultrasound, Ionotophoresis 4mg /ml Dexamethasone, and aquatic therapy, Manual therapy.  All included unless contraindicated  PLAN FOR NEXT SESSION: Compliant surface balance/reactive balance.   Chyrel Masson, PT, DPT, OCS, ATC 12/14/22  1:34 PM

## 2022-12-19 ENCOUNTER — Ambulatory Visit (INDEPENDENT_AMBULATORY_CARE_PROVIDER_SITE_OTHER): Payer: Medicare Other | Admitting: Rehabilitative and Restorative Service Providers"

## 2022-12-19 ENCOUNTER — Encounter: Payer: Self-pay | Admitting: Rehabilitative and Restorative Service Providers"

## 2022-12-19 DIAGNOSIS — M6281 Muscle weakness (generalized): Secondary | ICD-10-CM

## 2022-12-19 DIAGNOSIS — M25561 Pain in right knee: Secondary | ICD-10-CM

## 2022-12-19 DIAGNOSIS — R262 Difficulty in walking, not elsewhere classified: Secondary | ICD-10-CM | POA: Diagnosis not present

## 2022-12-19 DIAGNOSIS — M25661 Stiffness of right knee, not elsewhere classified: Secondary | ICD-10-CM

## 2022-12-19 DIAGNOSIS — G8929 Other chronic pain: Secondary | ICD-10-CM

## 2022-12-19 NOTE — Therapy (Signed)
OUTPATIENT PHYSICAL THERAPY TREATMENT   Patient Name: Katie Gaines MRN: [id number] DOB:13-Jul-1956, 67 y.o., female Today's Date: 12/19/2022  END OF SESSION:  PT End of Session - 12/19/22 1338     Visit Number 8    Number of Visits 20    Date for PT Re-Evaluation 01/24/23    Authorization Type Medicare    Progress Note Due on Visit 10    PT Start Time 1340    PT Stop Time 1425    PT Time Calculation (min) 45 min    Activity Tolerance Patient tolerated treatment well    Behavior During Therapy WFL for tasks assessed/performed                    Past Medical History:  Diagnosis Date   Anemia    Anxiety disorder    Cancer (HCC)    Common bile duct stone    Graves disease    History of kidney stones    Hypertension    Hypothyroidism    Iron deficiency    Lymphedema of leg    bilateral   Past Surgical History:  Procedure Laterality Date   ANTERIOR LAT LUMBAR FUSION N/A 08/26/2019   Procedure: ANTERIOR LATERAL LUMBAR INTERBODY FUSION, LUMBAR ONE LUMBAR TWO;  Surgeon: Donalee Citrin, MD;  Location: [facility] OR;  Service: Neurosurgery;  Laterality: N/A;  anterolateral   APPENDECTOMY     BACK SURGERY  1987-2007   X 10 Surgery   CHOLECYSTECTOMY     ENTEROSCOPY  11/10/2011   Procedure: ENTEROSCOPY;  Surgeon: Louis Meckel, MD;  Location: [facility];  Service: Endoscopy;  Laterality: N/A;   ERCP  07/19/2011   Procedure: ENDOSCOPIC RETROGRADE CHOLANGIOPANCREATOGRAPHY (ERCP);  Surgeon: Louis Meckel, MD;  Location: [facility];  Service: Endoscopy;  Laterality: N/A;   ESOPHAGOGASTRODUODENOSCOPY  09/2018   FRACTURE SURGERY Left 2018   left arm fracture   HOT HEMOSTASIS  11/10/2011   Procedure: HOT HEMOSTASIS (ARGON PLASMA COAGULATION/BICAP);  Surgeon: Louis Meckel, MD;  Location: [facility];  Service: Endoscopy;  Laterality: N/A;   TOTAL HIP ARTHROPLASTY Left 06/25/2021   Procedure: LEFT TOTAL HIP ARTHROPLASTY ANTERIOR APPROACH;  Surgeon: Kathryne Hitch, MD;   Location: [facility] ORS;  Service: Orthopedics;  Laterality: Left;   TOTAL KNEE ARTHROPLASTY Right 04/26/2022   Procedure: RIGHT TOTAL KNEE ARTHROPLASTY;  Surgeon: Kathryne Hitch, MD;  Location: [facility] OR;  Service: Orthopedics;  Laterality: Right;   Patient Active Problem List   Diagnosis Date Noted   Arthritis of knee, right 04/26/2022   Status post total right knee replacement 04/26/2022   Status post left hip replacement 06/25/2021   Avascular necrosis of hip, left (HCC) 06/24/2021   Fusion of spine of lumbar region 03/17/2021   Common peroneal neuropathy, left 06/02/2020   Dysesthesia 03/16/2020   Numbness 03/16/2020   Visual changes 03/16/2020   History of fusion of cervical spine 03/16/2020   History of fusion of lumbar spine 03/16/2020   Fatigue 11/29/2019   DDD (degenerative disc disease), lumbar 08/26/2019   Gastroesophageal reflux disease 06/17/2019   Vitamin D deficiency 06/17/2019   Irritable bowel syndrome with diarrhea 03/06/2019   Closed fracture of distal end of radius 10/04/2018   Injury of right wrist 09/13/2018   Low back pain 07/15/2016   Pain syndrome, chronic 09/09/2012   Postlaminectomy syndrome, not elsewhere classified 09/09/2012   Anemia 05/30/2012   Essential hypertension 02/13/2012   Chronic gastrointestinal bleeding 01/04/2012   Hypothyroidism 01/04/2012  Obstruction of bile duct 07/19/2011   Abnormal radiographic examination 07/19/2011   IRON DEFICIENCY 09/10/2009    PCP: Eartha Inch MD  REFERRING PROVIDER: Kathryne Hitch, MD  REFERRING DIAG: [id number] (ICD-10-CM) - History of total right knee replacement  THERAPY DIAG:  Chronic pain of right knee  Muscle weakness (generalized)  Difficulty in walking, not elsewhere classified  Stiffness of right knee, not elsewhere classified  Rationale for Evaluation and Treatment: Rehabilitation  ONSET DATE: 04/26/2022 Rt TKA  SUBJECTIVE:   SUBJECTIVE STATEMENT: She indicated  having a pretty good weekend.  No specific complaints.    PERTINENT HISTORY: Lt THA anterior 06/2021, L2-3 lumbar laminectomy/facetectomies/foraminotomies, PLIF L2-3, pedicle screw fixation L2-3, posterior lateral arthrodesis L2-3 on 8/10. PMH includes anemia, anxiety, hx of kidney stones, graves disease, HTN, hypothyroidism, iron deficiency, lymphedema,  PAIN:  NPRS scale: no specific symptoms indicated upon arrival.  Pain location: Rt knee - reported complaints in ankle/shin as well Pain description: "caught feeling" achy, sharp pain at times Aggravating factors: bending knee, car transfers, stairs Relieving factors: rest  PRECAUTIONS: None  WEIGHT BEARING RESTRICTIONS: No  FALLS:  Has patient fallen in last 6 months? 1 fall without injury No fear of falling reported.    LIVING ENVIRONMENT: Lives in: House/apartment Stairs: two stairs with one rail on Rt going up  OCCUPATION: no work.   PLOF: Independent, garden/yard work, Rt hand dominant, walking, travel.  Has membership [employer]  PATIENT GOALS: Reduce pain, get knee better  OBJECTIVE:   PATIENT SURVEYS:  12/14/2022:  FOTO update:  69  11/15/2022 FOTO intake:  59  predicted:  65  COGNITION: 11/15/2022 Overall cognitive status: WFL    SENSATION: 11/15/2022 Not tested  MUSCLE LENGTH: 11/15/2022 No specific testing today.  POSTURE:  11/15/2022 No Significant postural limitations  PALPATION: 11/15/2022 No specific tenderness reported today.   LUMBAR ROM: 12/07/2022:  Ranges assessed for Rt LE symptoms response to lumbar movements: Lumbar extension : 25 % WFL, repeated x 5 in standing with no change in distal LE symptoms   Lumbar flexion : to mid ankles without pain complaint changes for LE  12/05/2022  : 25 % WFL c end range pain  LOWER EXTREMITY ROM:   ROM Right 11/15/2022 Rt 11/24/2022 Right 12/19/2022  Hip flexion     Hip extension     Hip abduction     Hip adduction     Hip internal rotation     Hip external  rotation     Knee flexion 102 AROM in supine heel slide  105 AROM in supine heel slide 109 AROM in supine heel slide  Knee extension -4 AROM in LAQ    Ankle dorsiflexion     Ankle plantarflexion     Ankle inversion     Ankle eversion      (Blank rows = not tested)  LOWER EXTREMITY MMT:  MMT Right 11/15/2022 Left 11/15/2022 Right 12/05/2022  Hip flexion 5/5 5/5   Hip extension     Hip abduction     Hip adduction     Hip internal rotation     Hip external rotation     Knee flexion 5/5 5/5   Knee extension 5/5  34.8, 35.2 lbs 5/5  47.3, 46 5/5 36.3, 35 lbs  Ankle dorsiflexion 5/5 5/5   Ankle plantarflexion     Ankle inversion     Ankle eversion      (Blank rows = not tested)  LOWER EXTREMITY SPECIAL TESTS:  11/15/2022 No  specific testing today  FUNCTIONAL TESTS:  11/15/2022 18 inch chair transfer: Lt SLS: 3 seconds  Rt SLS: 7 seconds  GAIT: 11/15/2022 Independent ambulation in clinic on level surfaces.   TODAY'S TREATMENT                                                                          DATE:12/19/2022 Therex: Nustep Lvl 6 15 mins UE/LE Leg press double leg 81 lbs 2 x 15 , single leg 2 x 15 37 lbs, performed bilaterally -cues for slow movement focus Incline gastroc stretch 30 sec x 3 bilateral (cues for home use on step)  Neuro Re-ed On foam alternating heel/toe raises x 30 with SBA, on floor x 10 for HEP educaiton Tandem stance on foam 1 min x 2 bilateral in // bars with occasional HHA   TODAY'S TREATMENT                                                                          DATE:12/14/2022 Therex: Nustep Lvl 6 12 mins UE/LE Leg press double leg 81 lbs x 20, single leg 2 x 15 31 lbs, performed bilaterally -cues for slow movement focus Incline gastroc stretch 30 sec x 3 bilateral (cues for home use on step)   Neuro Re-ed Fitter rocker board fwd/back light touching double leg control x 20 c SBA to min A at times Tandem stance on foam 1 min x 2 bilateral in  // bars with occasional HHA Tandem ambulation fwd/back in // bars occasional HHA 10 ft x 4 each way  TODAY'S TREATMENT                                                                          DATE:12/07/2022 Therex: Nustep Lvl 6 12 mins UE/LE Standing lumbar extension AROM x 5 Standing lumbar flexion x 2 Incline gastroc stretch 30 sec x 3 bilateral (cues for home use on step) Seated SLR 2 x 15 bilateral  Neuro Re-ed Fitter rocker board fwd/back light touching double leg control x 20 c SBA  Alternating heel /toe lifts x 20 bilateral      PATIENT EDUCATION:  11/15/2022 Education details: HEP, POC Person educated: Patient Education method: Programmer, multimedia, Facilities manager, Verbal cues, and Handouts Education comprehension: verbalized understanding, returned demonstration, and verbal cues required  HOME EXERCISE PROGRAM: Access Code: [email or web address] URL: [email or web address] Date: 11/29/2022 Prepared by: Ivery Quale  Exercises - Seated Long Arc Quad  - 3-5 x daily - 7 x weekly - 1-2 sets - 10 reps - 2 hold - Seated Knee Flexion Extension AAROM with Overpressure  - 2-3 x daily - 7 x weekly - 1 sets - 5 reps - 10 hold -  Seated Quad Set  - 3-5 x daily - 7 x weekly - 1 sets - 10 reps - 5 hold - Seated Straight Leg Heel Taps  - 1-2 x daily - 7 x weekly - 2-3 sets - 10 reps - Sit to Stand  - 3 x daily - 7 x weekly - 1 sets - 10 reps - Knee Extension with Weight Machine  - 1 x daily - 3 x weekly - 2-3 sets - 10-15 reps - Hamstring Curl with Weight Machine  - 1 x daily - 3 x weekly - 2-3 sets - 10-15 reps - Full Leg Press  - 1 x daily - 3 x weekly - 2-3 sets - 10-15 reps - Single Leg Press  - 1 x daily - 3 x weekly - 2-3 sets - 10-15 reps  ASSESSMENT:  CLINICAL IMPRESSION: Pt continued to show improvement in confidence in HEP and use of gym for continued progression upon HEP transitioning as able.   Continued education regarding balance interventions and techniques.  .   OBJECTIVE  IMPAIRMENTS: decreased activity tolerance, decreased balance, decreased coordination, decreased endurance, decreased mobility, difficulty walking, decreased ROM, decreased strength, impaired perceived functional ability, impaired flexibility, improper body mechanics, and pain.   ACTIVITY LIMITATIONS: carrying, lifting, bending, standing, stairs, transfers, and locomotion level  PARTICIPATION LIMITATIONS: interpersonal relationship, driving, community activity, and yard work  PERSONAL FACTORS:  Lt THA anterior 06/2021, multiple back surgeries. PMH includes anemia, anxiety, hx of kidney stones, graves disease, HTN, hypothyroidism, iron deficiency, lymphedema,   are also affecting patient's functional outcome.   REHAB POTENTIAL: Good  CLINICAL DECISION MAKING: Stable/uncomplicated  EVALUATION COMPLEXITY: Low   GOALS: Goals reviewed with patient? Yes  SHORT TERM GOALS: (target date for Short term goals are 3 weeks 12/06/2022)   1.  Patient will demonstrate independent use of home exercise program to maintain progress from in clinic treatments.  Goal status: Met  LONG TERM GOALS: (target dates for all long term goals are 10 weeks  01/24/2023 )   1. Patient will demonstrate/report pain at worst less than or equal to 2/10 to facilitate minimal limitation in daily activity secondary to pain symptoms.  Goal status: on going 12/19/2022   2. Patient will demonstrate independent use of home exercise program to facilitate ability to maintain/progress functional gains from skilled physical therapy services.  Goal status: on going 12/19/2022   3. Patient will demonstrate FOTO outcome > or = 65 % to indicate reduced disability due to condition.  Goal status: on going 12/19/2022   4.  Patient will demonstrate Rt LE MMT 5/5 throughout to faciltiate usual transfers, stairs, squatting at [facility] for daily life.   Goal status: on going 12/19/2022   5.  Patient will demonstrate Rt knee AROM 0-110 deg to  facilitate usual mobility for daily activity , transfers.  Goal status: on going 12/19/2022   6.  Patient will demonstrate ascending/descending stairs c reciprocal gait pattern with one rail for house navigation.  Goal status: on going 12/19/2022    PLAN:  PT FREQUENCY: 1-2x/week  PT DURATION: 10 weeks  PLANNED INTERVENTIONS: Therapeutic exercises, Therapeutic activity, Neuro Muscular re-education, Balance training, Gait training, Patient/Family education, Joint mobilization, Stair training, DME instructions, Dry Needling, Electrical stimulation, Traction, Cryotherapy, vasopneumatic deviceMoist heat, Taping, Ultrasound, Ionotophoresis 4mg /ml Dexamethasone, and aquatic therapy, Manual therapy.  All included unless contraindicated  PLAN FOR NEXT SESSION: Compliant surface balance/reactive balance improvements.  Janyth Contes Keiran Sias Litter note next visit for MD visit  Chyrel Masson, PT, DPT,  OCS, ATC 12/19/22  2:30 PM

## 2022-12-21 ENCOUNTER — Encounter: Payer: Self-pay | Admitting: Rehabilitative and Restorative Service Providers"

## 2022-12-21 ENCOUNTER — Ambulatory Visit (INDEPENDENT_AMBULATORY_CARE_PROVIDER_SITE_OTHER): Payer: Medicare Other | Admitting: Rehabilitative and Restorative Service Providers"

## 2022-12-21 DIAGNOSIS — M6281 Muscle weakness (generalized): Secondary | ICD-10-CM | POA: Diagnosis not present

## 2022-12-21 DIAGNOSIS — M25661 Stiffness of right knee, not elsewhere classified: Secondary | ICD-10-CM

## 2022-12-21 DIAGNOSIS — R262 Difficulty in walking, not elsewhere classified: Secondary | ICD-10-CM | POA: Diagnosis not present

## 2022-12-21 DIAGNOSIS — M25561 Pain in right knee: Secondary | ICD-10-CM

## 2022-12-21 DIAGNOSIS — G8929 Other chronic pain: Secondary | ICD-10-CM

## 2022-12-21 NOTE — Therapy (Addendum)
OUTPATIENT PHYSICAL THERAPY TREATMENT /PROGRESS NOTE /DISCHARGE   Patient Name: Katie Gaines MRN: [id number] DOB:[birth year], 67 y.o., female Today's Date: 12/21/2022  Progress Note Reporting Period 11/15/2022 to 12/21/2022  See note below for Objective Data and Assessment of Progress/Goals.   END OF SESSION:  PT End of Session - 12/21/22 1258     Visit Number 9    Number of Visits 20    Date for PT Re-Evaluation 01/24/23    Authorization Type Medicare    Progress Note Due on Visit 10    PT Start Time 1258    PT Stop Time 1326    PT Time Calculation (min) 28 min    Activity Tolerance Patient tolerated treatment well    Behavior During Therapy WFL for tasks assessed/performed               Past Medical History:  Diagnosis Date   Anemia    Anxiety disorder    Cancer (HCC)    Common bile duct stone    Graves disease    History of kidney stones    Hypertension    Hypothyroidism    Iron deficiency    Lymphedema of leg    bilateral   Past Surgical History:  Procedure Laterality Date   ANTERIOR LAT LUMBAR FUSION N/A 08/26/2019   Procedure: ANTERIOR LATERAL LUMBAR INTERBODY FUSION, LUMBAR ONE LUMBAR TWO;  Surgeon: Donalee Citrin, MD;  Location: [facility];  Service: Neurosurgery;  Laterality: N/A;  anterolateral   APPENDECTOMY     BACK SURGERY  1987-2007   X 10 Surgery   CHOLECYSTECTOMY     ENTEROSCOPY  11/10/2011   Procedure: ENTEROSCOPY;  Surgeon: Louis Meckel, MD;  Location: [facility];  Service: Endoscopy;  Laterality: N/A;   ERCP  07/19/2011   Procedure: ENDOSCOPIC RETROGRADE CHOLANGIOPANCREATOGRAPHY (ERCP);  Surgeon: Louis Meckel, MD;  Location: [facility];  Service: Endoscopy;  Laterality: N/A;   ESOPHAGOGASTRODUODENOSCOPY  09/2018   FRACTURE SURGERY Left 2018   left arm fracture   HOT HEMOSTASIS  11/10/2011   Procedure: HOT HEMOSTASIS (ARGON PLASMA COAGULATION/BICAP);  Surgeon: Louis Meckel, MD;  Location: [facility];  Service: Endoscopy;  Laterality:  N/A;   TOTAL HIP ARTHROPLASTY Left 06/25/2021   Procedure: LEFT TOTAL HIP ARTHROPLASTY ANTERIOR APPROACH;  Surgeon: Kathryne Hitch, MD;  Location: [facility];  Service: Orthopedics;  Laterality: Left;   TOTAL KNEE ARTHROPLASTY Right 04/26/2022   Procedure: RIGHT TOTAL KNEE ARTHROPLASTY;  Surgeon: Kathryne Hitch, MD;  Location: [facility];  Service: Orthopedics;  Laterality: Right;   Patient Active Problem List   Diagnosis Date Noted   Arthritis of knee, right 04/26/2022   Status post total right knee replacement 04/26/2022   Status post left hip replacement 06/25/2021   Avascular necrosis of hip, left (HCC) 06/24/2021   Fusion of spine of lumbar region 03/17/2021   Common peroneal neuropathy, left 06/02/2020   Dysesthesia 03/16/2020   Numbness 03/16/2020   Visual changes 03/16/2020   History of fusion of cervical spine 03/16/2020   History of fusion of lumbar spine 03/16/2020   Fatigue 11/29/2019   DDD (degenerative disc disease), lumbar 08/26/2019   Gastroesophageal reflux disease 06/17/2019   Vitamin D deficiency 06/17/2019   Irritable bowel syndrome with diarrhea 03/06/2019   Closed fracture of distal end of radius 10/04/2018   Injury of right wrist 09/13/2018   Low back pain 07/15/2016   Pain syndrome, chronic 09/09/2012   Postlaminectomy syndrome, not elsewhere classified 09/09/2012  Anemia 05/30/2012   Essential hypertension 02/13/2012   Chronic gastrointestinal bleeding 01/04/2012   Hypothyroidism 01/04/2012   Obstruction of bile duct 07/19/2011   Abnormal radiographic examination 07/19/2011   IRON DEFICIENCY 09/10/2009    PCP: Eartha Inch MD  REFERRING PROVIDER: Kathryne Hitch, MD  REFERRING DIAG: [phone] (ICD-10-CM) - History of total right knee replacement  THERAPY DIAG:  Chronic pain of right knee  Muscle weakness (generalized)  Difficulty in walking, not elsewhere classified  Stiffness of right knee, not elsewhere  classified  Rationale for Evaluation and Treatment: Rehabilitation  ONSET DATE: 04/26/2022 Rt TKA  SUBJECTIVE:   SUBJECTIVE STATEMENT: She reported having a step weird feeling on Rt knee that she noticed but was better by nighttime.   Global rating of change reported at +7 a very great deal better.   Her quote for today "I really appreciate and am thankful , more than anything, that you got me to the gym."     PERTINENT HISTORY: Lt THA anterior 06/2021, L2-3 lumbar laminectomy/facetectomies/foraminotomies, PLIF L2-3, pedicle screw fixation L2-3, posterior lateral arthrodesis L2-3 on 8/10. PMH includes anemia, anxiety, hx of kidney stones, graves disease, HTN, hypothyroidism, iron deficiency, lymphedema,  PAIN:  NPRS scale: no specific symptoms indicated upon arrival.  Pain location: Rt knee - reported complaints in ankle/shin as well Pain description: "caught feeling" achy, sharp pain at times Aggravating factors: bending knee, car transfers, stairs Relieving factors: rest  PRECAUTIONS: None  WEIGHT BEARING RESTRICTIONS: No  FALLS:  Has patient fallen in last 6 months? 1 fall without injury No fear of falling reported.    LIVING ENVIRONMENT: Lives in: House/apartment Stairs: two stairs with one rail on Rt going up  OCCUPATION: no work.   PLOF: Independent, garden/yard work, Rt hand dominant, walking, travel.  Has membership [employer]  PATIENT GOALS: Reduce pain, get knee better  OBJECTIVE:   PATIENT SURVEYS:  12/14/2022:  FOTO update:  69  11/15/2022 FOTO intake:  59  predicted:  65  COGNITION: 11/15/2022 Overall cognitive status: WFL    SENSATION: 11/15/2022 Not tested  MUSCLE LENGTH: 11/15/2022 No specific testing today.  POSTURE:  11/15/2022 No Significant postural limitations  PALPATION: 11/15/2022 No specific tenderness reported today.   LUMBAR ROM: 12/07/2022:  Ranges assessed for Rt LE symptoms response to lumbar movements: Lumbar extension : 25 % WFL, repeated  x 5 in standing with no change in distal LE symptoms   Lumbar flexion : to mid ankles without pain complaint changes for LE  12/05/2022  : 25 % WFL c end range pain  LOWER EXTREMITY ROM:   ROM Right 11/15/2022 Rt 11/24/2022 Right 12/19/2022  Hip flexion     Hip extension     Hip abduction     Hip adduction     Hip internal rotation     Hip external rotation     Knee flexion 102 AROM in supine heel slide  105 AROM in supine heel slide 109 AROM in supine heel slide  Knee extension -4 AROM in LAQ    Ankle dorsiflexion     Ankle plantarflexion     Ankle inversion     Ankle eversion      (Blank rows = not tested)  LOWER EXTREMITY MMT:  MMT Right 11/15/2022 Left 11/15/2022 Right 12/05/2022 Right 12/21/2022  Hip flexion 5/5 5/5    Hip extension      Hip abduction      Hip adduction      Hip internal rotation  Hip external rotation      Knee flexion 5/5 5/5    Knee extension 5/5  34.8, 35.2 lbs 5/5  47.3, 46 5/5 36.3, 35 lbs 5/5  45, 39 lbs  Ankle dorsiflexion 5/5 5/5    Ankle plantarflexion      Ankle inversion      Ankle eversion       (Blank rows = not tested)  LOWER EXTREMITY SPECIAL TESTS:  11/15/2022 No specific testing today  FUNCTIONAL TESTS:  11/15/2022 18 inch chair transfer: Lt SLS: 3 seconds  Rt SLS: 7 seconds  GAIT: 11/15/2022 Independent ambulation in clinic on level surfaces.   TODAY'S TREATMENT                                                                          DATE: 12/21/2022 Therex: Nustep Lvl 6 12 mins UE/LE  Review of existing HEP techniques/routine.   TherActivity Ascending/descending flight of stairs c reciprocal gait pattern, single hand assist on bar as needed.  SBA.   Neuro Re-ed On foam alternating heel/toe raises x 30 with SBA Tandem ambulation fwd/back in // bars 10 ft x 5 each way  TODAY'S TREATMENT                                                                          DATE:12/19/2022 Therex: Nustep Lvl 6 15 mins  UE/LE Leg press double leg 81 lbs 2 x 15 , single leg 2 x 15 37 lbs, performed bilaterally -cues for slow movement focus Incline gastroc stretch 30 sec x 3 bilateral (cues for home use on step)  Neuro Re-ed On foam alternating heel/toe raises x 30 with SBA, on floor x 10 for HEP educaiton Tandem stance on foam 1 min x 2 bilateral in // bars with occasional HHA   TODAY'S TREATMENT                                                                          DATE:12/14/2022 Therex: Nustep Lvl 6 12 mins UE/LE Leg press double leg 81 lbs x 20, single leg 2 x 15 31 lbs, performed bilaterally -cues for slow movement focus Incline gastroc stretch 30 sec x 3 bilateral (cues for home use on step)   Neuro Re-ed Fitter rocker board fwd/back light touching double leg control x 20 c SBA to min A at times Tandem stance on foam 1 min x 2 bilateral in // bars with occasional HHA Tandem ambulation fwd/back in // bars occasional HHA 10 ft x 4 each way   PATIENT EDUCATION:  11/15/2022 Education details: HEP, POC Person educated: Patient Education method: Programmer, multimedia, Facilities manager, Verbal cues, and Handouts Education comprehension: verbalized understanding,  returned demonstration, and verbal cues required  HOME EXERCISE PROGRAM: Access Code: [email or web address] URL: [email or web address] Date: 11/29/2022 Prepared by: Ivery Quale  Exercises - Seated Long Arc Quad  - 3-5 x daily - 7 x weekly - 1-2 sets - 10 reps - 2 hold - Seated Knee Flexion Extension AAROM with Overpressure  - 2-3 x daily - 7 x weekly - 1 sets - 5 reps - 10 hold - Seated Quad Set  - 3-5 x daily - 7 x weekly - 1 sets - 10 reps - 5 hold - Seated Straight Leg Heel Taps  - 1-2 x daily - 7 x weekly - 2-3 sets - 10 reps - Sit to Stand  - 3 x daily - 7 x weekly - 1 sets - 10 reps - Knee Extension with Weight Machine  - 1 x daily - 3 x weekly - 2-3 sets - 10-15 reps - Hamstring Curl with Weight Machine  - 1 x daily - 3 x weekly - 2-3 sets  - 10-15 reps - Full Leg Press  - 1 x daily - 3 x weekly - 2-3 sets - 10-15 reps - Single Leg Press  - 1 x daily - 3 x weekly - 2-3 sets - 10-15 reps  ASSESSMENT:  CLINICAL IMPRESSION: Pt has attended 9 visits overall during course of treatment.  Global rating of change +7 at this time. See objective data for updated for updated information.  Pt to benefit from long term plan of routine HEP and gym activity to promote continued strengthening, balance and general fitness improvements.   Pt was in agreement with plan to trial HEP and gym on own at this time for 30 days with return pending symptom increase.  .   OBJECTIVE IMPAIRMENTS: decreased activity tolerance, decreased balance, decreased coordination, decreased endurance, decreased mobility, difficulty walking, decreased ROM, decreased strength, impaired perceived functional ability, impaired flexibility, improper body mechanics, and pain.   ACTIVITY LIMITATIONS: carrying, lifting, bending, standing, stairs, transfers, and locomotion level  PARTICIPATION LIMITATIONS: interpersonal relationship, driving, community activity, and yard work  PERSONAL FACTORS:  Lt THA anterior 06/2021, multiple back surgeries. PMH includes anemia, anxiety, hx of kidney stones, graves disease, HTN, hypothyroidism, iron deficiency, lymphedema,   are also affecting patient's functional outcome.   REHAB POTENTIAL: Good  CLINICAL DECISION MAKING: Stable/uncomplicated  EVALUATION COMPLEXITY: Low   GOALS: Goals reviewed with patient? Yes  SHORT TERM GOALS: (target date for Short term goals are 3 weeks 12/06/2022)   1.  Patient will demonstrate independent use of home exercise program to maintain progress from in clinic treatments.  Goal status: Met  LONG TERM GOALS: (target dates for all long term goals are 10 weeks  01/24/2023 )   1. Patient will demonstrate/report pain at worst less than or equal to 2/10 to facilitate minimal limitation in daily activity  secondary to pain symptoms.  Goal status: on going 12/19/2022   2. Patient will demonstrate independent use of home exercise program to facilitate ability to maintain/progress functional gains from skilled physical therapy services.  Goal status: on going 12/19/2022   3. Patient will demonstrate FOTO outcome > or = 65 % to indicate reduced disability due to condition.  Goal status: Met 12/21/2022   4.  Patient will demonstrate Rt LE MMT 5/5 throughout to faciltiate usual transfers, stairs, squatting at [facility] for daily life.   Goal status: Met 12/21/2022   5.  Patient will demonstrate Rt knee AROM 0-110 deg to facilitate usual mobility for daily  activity , transfers.  Goal status: on going 12/19/2022   6.  Patient will demonstrate ascending/descending stairs c reciprocal gait pattern with one rail for house navigation.  Goal status: Met 12/21/2022    PLAN:  PT FREQUENCY: 1-2x/week  PT DURATION: 10 weeks  PLANNED INTERVENTIONS: Therapeutic exercises, Therapeutic activity, Neuro Muscular re-education, Balance training, Gait training, Patient/Family education, Joint mobilization, Stair training, DME instructions, Dry Needling, Electrical stimulation, Traction, Cryotherapy, vasopneumatic deviceMoist heat, Taping, Ultrasound, Ionotophoresis 4mg /ml Dexamethasone, and aquatic therapy, Manual therapy.  All included unless contraindicated  PLAN FOR NEXT SESSION: Trial HEP period.  Discharge after 30 days inactivity.   Chyrel Masson, PT, DPT, OCS, ATC 12/21/22  1:28 PM   PHYSICAL THERAPY DISCHARGE SUMMARY  Visits from Start of Care: 9  Current functional level related to goals / functional outcomes: See note   Remaining deficits: See note   Education / Equipment: HEP  Patient goals were met. Patient is being discharged due to being pleased with the current functional level.  Chyrel Masson, PT, DPT, OCS, ATC 01/26/23  9:51 AM

## 2022-12-22 ENCOUNTER — Encounter: Payer: Self-pay | Admitting: Orthopaedic Surgery

## 2022-12-22 ENCOUNTER — Ambulatory Visit (INDEPENDENT_AMBULATORY_CARE_PROVIDER_SITE_OTHER): Payer: Medicare Other | Admitting: Orthopaedic Surgery

## 2022-12-22 ENCOUNTER — Other Ambulatory Visit (INDEPENDENT_AMBULATORY_CARE_PROVIDER_SITE_OTHER): Payer: Medicare Other

## 2022-12-22 DIAGNOSIS — Z96651 Presence of right artificial knee joint: Secondary | ICD-10-CM | POA: Diagnosis not present

## 2022-12-22 NOTE — Progress Notes (Signed)
The patient is now 8 months status post a right total knee arthroplasty.  In 2022 we did replace her left hip.  Both are doing very well.  She is an active 66 year old female.  She is now transition out of physical therapy and is going to a gym and does have a home exercise program as well.  Her right operative knee shows full flexion and full extension.  He feels ligamentously stable.  It looks really good overall.  Her left knee and both her hips move well.  2 views of the right knee show well-seated total knee arthroplasty with no complicating features.  At this point follow-up can be as needed.  If she does develop any issues at all she knows to let us know.  All questions and concerns were addressed and answered.

## 2023-02-13 ENCOUNTER — Ambulatory Visit (INDEPENDENT_AMBULATORY_CARE_PROVIDER_SITE_OTHER): Payer: Medicare Other

## 2023-02-13 ENCOUNTER — Ambulatory Visit (INDEPENDENT_AMBULATORY_CARE_PROVIDER_SITE_OTHER): Payer: Medicare Other | Admitting: Physician Assistant

## 2023-02-13 ENCOUNTER — Telehealth: Payer: Self-pay | Admitting: Orthopaedic Surgery

## 2023-02-13 ENCOUNTER — Other Ambulatory Visit: Payer: Self-pay

## 2023-02-13 ENCOUNTER — Ambulatory Visit
Admission: RE | Admit: 2023-02-13 | Discharge: 2023-02-13 | Disposition: A | Discharge status: Home / Self Care | Payer: Medicare Other | Source: Ambulatory Visit | Attending: Physician Assistant | Admitting: Physician Assistant

## 2023-02-13 ENCOUNTER — Encounter: Payer: Self-pay | Admitting: Physician Assistant

## 2023-02-13 VITALS — BP 150/71 | HR 62

## 2023-02-13 DIAGNOSIS — Z96642 Presence of left artificial hip joint: Secondary | ICD-10-CM

## 2023-02-13 DIAGNOSIS — T84061A Wear of articular bearing surface of internal prosthetic left hip joint, initial encounter: Secondary | ICD-10-CM

## 2023-02-13 NOTE — Telephone Encounter (Signed)
Pt called statin her left leg is making a squishy noise. Pt states she is unable to stand straight up. She is in severe pain. Please call pt at [phone].

## 2023-02-13 NOTE — Telephone Encounter (Signed)
I called pt. She said he leg is making a noise that sounds like a "squishy flip flop". Says feels like her whole leg is full of fluid and is very painful. I worked her in to see Bronson Curb today

## 2023-02-13 NOTE — Progress Notes (Addendum)
Office Visit Note   Patient: Katie Gaines           Date of Birth: November 05, 1955           MRN: [id number] Visit Date: 02/13/2023              Requested by: Eartha Inch, MD [address] [place],  [place] [address] PCP: Eartha Inch, MD   Assessment & Plan: Visit Diagnoses:  1. Status post total replacement of left hip   2. 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  CT scan did show hip to be well located.  This eccentric wear show at the hip ball right and left superior.  No other gross findings acute fractures.  Patient was called by Dr. Magnus Ivan and reviewed we will have her seen next week in the office to set her up for surgery for left total hip revision of acetabular component.  Most likely this will be polyliner exchange and hip ball exchange.  In the interim she will offload the hip with a walker or cane preferably walker.  Follow-Up Instructions: Return in about 1 week (around 02/20/2023).   Orders:  Orders Placed This Encounter  Procedures   XR HIP UNILAT W OR W/O PELVIS 2-3 VIEWS LEFT   No orders of the defined types were placed in this encounter.     Procedures: No procedures performed   Clinical Data: No additional findings.   Subjective: Chief Complaint  Patient presents with   Left Hip - Pain    THA 06/25/2021    HPI Katie Gaines 67 year old female with a history of left total hip arthroplasty 06/25/2021.  She was doing well until yesterday she noticed that she is having a squishing noise when ambulating.  Initially she thought it was her flip-flop was sewed.  However this question continued even whenever she was barefooted.  Then she developed left hip pain last night.  She denies some low back pain.  No known injury.  Hip  otherwise has been doing well until recently. Review of Systems See HPI otherwise negative noncontributory  Objective: Vital Signs: BP (!) 150/71   Pulse 62   Physical Exam Constitutional:      Appearance: She is normal weight. She is not ill-appearing or diaphoretic.  Pulmonary:     Effort: Pulmonary effort is normal.  Neurological:     Mental Status: She is alert and oriented to person, place, and time.     Ortho Exam Bilateral hips good range of motion of both hips.  Slight discomfort with internal rotation.  Also slightly decreased internal rotation of the left hip compared to the right.  Leg lengths are equal on exam. Specialty Comments:  No specialty comments available.  Imaging: XR HIP UNILAT W OR W/O PELVIS 2-3 VIEWS LEFT  Result Date: 02/13/2023 AP pelvis and lateral view of the left hip: Femoral component left total hip well-seated no signs of loosening or hardware failure.  Left femoral head is high riding cannot rule out dislocation.  Acetabular component otherwise well-seated.  No acute fractures or acute findings otherwise.   CT HIP LEFT WO CONTRAST  Result Date: 02/13/2023 CLINICAL DATA:  Hip trauma, fracture suspected. EXAM: CT OF THE LEFT HIP WITHOUT CONTRAST TECHNIQUE: Multidetector CT imaging  of the left hip was performed according to the standard protocol. Multiplanar CT image reconstructions were also generated. RADIATION DOSE REDUCTION: This exam was performed according to the departmental dose-optimization program which includes automated exposure control, adjustment of the mA and/or kV according to patient size and/or use of iterative reconstruction technique. COMPARISON:  None Available. FINDINGS: Bones/Joint/Cartilage Status post left hip arthroplasty with eccentric location of the head of the femoral component suggesting polyethylene wear. No evidence of acute fracture or perihardware loosening. Multilevel degenerate disc disease of the lumbar spine with  interbody fusion at L4-L5 and L5-S1. Ligaments Suboptimally assessed by CT. Muscles and Tendons Muscles are normal in bulk.  No evidence of tendon tear. Soft tissues Prominent atherosclerotic calcification of abdominal aorta and branch vessels. Degenerated fibroid in the uterus. IMPRESSION: 1. Status post left hip arthroplasty with eccentric location of the head of the femoral component suggesting polyethylene wear. No evidence of acute fracture or perihardware loosening. 2. Muscles are normal in bulk. No evidence of tendon tear. 3. Multilevel degenerate disc disease of the lumbar spine with interbody fusion at L4-L5 and L5-S1. 4. Aortic atherosclerosis. Electronically Signed   By: Larose Hires D.O.   On: 02/13/2023 13:01     PMFS History: Patient Active Problem List   Diagnosis Date Noted   Arthritis of knee, right 04/26/2022   Status post total right knee replacement 04/26/2022   Status post left hip replacement 06/25/2021   Avascular necrosis of hip, left (HCC) 06/24/2021   Fusion of spine of lumbar region 03/17/2021   Common peroneal neuropathy, left 06/02/2020   Dysesthesia 03/16/2020   Numbness 03/16/2020   Visual changes 03/16/2020   History of fusion of cervical spine 03/16/2020   History of fusion of lumbar spine 03/16/2020   Fatigue 11/29/2019   DDD (degenerative disc disease), lumbar 08/26/2019   Gastroesophageal reflux disease 06/17/2019   Vitamin D deficiency 06/17/2019   Irritable bowel syndrome with diarrhea 03/06/2019   Closed fracture of distal end of radius 10/04/2018   Injury of right wrist 09/13/2018   Low back pain 07/15/2016   Pain syndrome, chronic 09/09/2012   Postlaminectomy syndrome, not elsewhere classified 09/09/2012   Anemia 05/30/2012   Essential hypertension 02/13/2012   Chronic gastrointestinal bleeding 01/04/2012   Hypothyroidism 01/04/2012   Obstruction of bile duct 07/19/2011   Abnormal radiographic examination 07/19/2011   IRON DEFICIENCY  09/10/2009   Past Medical History:  Diagnosis Date   Anemia    Anxiety disorder    Cancer (HCC)    Common bile duct stone    Graves disease    History of kidney stones    Hypertension    Hypothyroidism    Iron deficiency    Lymphedema of leg    bilateral    Family History  Problem Relation Age of Onset   Colon polyps Father    Diabetes Father    Brain cancer Mother    Colon cancer Neg Hx    Malignant hyperthermia Neg Hx     Past Surgical History:  Procedure Laterality Date   ANTERIOR LAT LUMBAR FUSION N/A 08/26/2019   Procedure: ANTERIOR LATERAL LUMBAR INTERBODY FUSION, LUMBAR ONE LUMBAR TWO;  Surgeon: Donalee Citrin, MD;  Location: [facility] OR;  Service: Neurosurgery;  Laterality: N/A;  anterolateral   APPENDECTOMY     BACK SURGERY  1987-2007   X 10 Surgery   CHOLECYSTECTOMY     ENTEROSCOPY  11/10/2011   Procedure: ENTEROSCOPY;  Surgeon: Louis Meckel, MD;  Location: [facility];  Service: Endoscopy;  Laterality: N/A;   ERCP  07/19/2011   Procedure: ENDOSCOPIC RETROGRADE CHOLANGIOPANCREATOGRAPHY (ERCP);  Surgeon: Louis Meckel, MD;  Location: [facility];  Service: Endoscopy;  Laterality: N/A;   ESOPHAGOGASTRODUODENOSCOPY  09/2018   FRACTURE SURGERY Left 2018   left arm fracture   HOT HEMOSTASIS  11/10/2011   Procedure: HOT HEMOSTASIS (ARGON PLASMA COAGULATION/BICAP);  Surgeon: Louis Meckel, MD;  Location: [facility];  Service: Endoscopy;  Laterality: N/A;   TOTAL HIP ARTHROPLASTY Left 06/25/2021   Procedure: LEFT TOTAL HIP ARTHROPLASTY ANTERIOR APPROACH;  Surgeon: Kathryne Hitch, MD;  Location: [facility];  Service: Orthopedics;  Laterality: Left;   TOTAL KNEE ARTHROPLASTY Right 04/26/2022   Procedure: RIGHT TOTAL KNEE ARTHROPLASTY;  Surgeon: Kathryne Hitch, MD;  Location: [facility];  Service: Orthopedics;  Laterality: Right;   Social History   Occupational History   Not on file  Tobacco Use   Smoking status: Former    Packs/day: 0.50    Years: 45.00     Additional pack years: 0.00    Total pack years: 22.50    Types: Cigarettes    Quit date: 09/08/2020    Years since quitting: 2.4   Smokeless tobacco: Never  Vaping Use   Vaping Use: Never used  Substance and Sexual Activity   Alcohol use: Yes    Alcohol/week: 0.0 standard drinks of alcohol    Comment: 1 drink per month    Drug use: No   Sexual activity: Yes

## 2023-02-17 ENCOUNTER — Other Ambulatory Visit: Payer: Self-pay

## 2023-02-17 NOTE — Progress Notes (Addendum)
COVID Vaccine Completed: yes  Date of COVID positive in last 90 days: yes mid July  PCP - looking for new one Cardiologist - Vilinda Boehringer, MD for CP  Chest x-ray - CT 09/27/22 [facility] EKG - 04/20/22 Epic Stress Test - n/a ECHO - n/a Cardiac Cath - n/a Pacemaker/ICD device last checked: n/a  Spinal Cord Stimulator: n/a  Bowel Prep - no  Sleep Study - n/a CPAP -   Fasting Blood Sugar - n/a Checks Blood Sugar _____ times a day  Last dose of GLP1 agonist-  N/A GLP1 instructions:  N/A   Last dose of SGLT-2 inhibitors-  N/A SGLT-2 instructions: N/A   Blood Thinner Instructions:  n/a Aspirin Instructions: Last Dose:  Activity level: Can go up a flight of stairs and perform activities of daily living without stopping and without symptoms of chest pain or shortness of breath.   Anesthesia review:   Patient denies shortness of breath, fever, cough and chest pain at PAT appointment  Patient verbalized understanding of instructions that were given to them at the PAT appointment. Patient was also instructed that they will need to review over the PAT instructions again at home before surgery.

## 2023-02-17 NOTE — Patient Instructions (Addendum)
SURGICAL WAITING ROOM VISITATION  Patients having surgery or a procedure may have no more than 2 support people in the waiting area - these visitors may rotate.    Children under the age of 11 must have an adult with them who is not the patient.  Due to an increase in RSV and influenza rates and associated hospitalizations, children ages 52 and under may not visit patients in [facility] hospitals.  If the patient needs to stay at the hospital during part of their recovery, the visitor guidelines for inpatient rooms apply. Pre-op nurse will coordinate an appropriate time for 1 support person to accompany patient in pre-op.  This support person may not rotate.    Please refer to the [facility] website for the visitor guidelines for Inpatients (after your surgery is over and you are in a regular room).    Your procedure is scheduled on: 02/24/23   Report to [facility] Main Entrance    Report to admitting at 11:25 PM   Call this number if you have problems the morning of surgery [phone]   Do not eat food :After Midnight.   After Midnight you may have the following liquids until 10:55 AM DAY OF SURGERY  Water Non-Citrus Juices (without pulp, NO RED-Apple, White grape, White cranberry) Black Coffee (NO MILK/CREAM OR CREAMERS, sugar ok)  Clear Tea (NO MILK/CREAM OR CREAMERS, sugar ok) regular and decaf                             Plain Jell-O (NO RED)                                           Fruit ices (not with fruit pulp, NO RED)                                     Popsicles (NO RED)                                                               Sports drinks like Gatorade (NO RED)                 The day of surgery:  Drink ONE (1) Pre-Surgery Clear Ensure at 11:30 AM the morning of surgery. Drink in one sitting. Do not sip.  This drink was given to you during your hospital  pre-op appointment visit. Nothing else to drink after completing the  Pre-Surgery Clear  Ensure.          If you have questions, please contact your surgeon's office.   FOLLOW BOWEL PREP AND ANY ADDITIONAL PRE OP INSTRUCTIONS YOU RECEIVED FROM YOUR SURGEON'S OFFICE!!!     Oral Hygiene is also important to reduce your risk of infection.                                    Remember - BRUSH YOUR TEETH THE MORNING OF SURGERY WITH YOUR REGULAR TOOTHPASTE  DENTURES WILL  BE REMOVED PRIOR TO SURGERY PLEASE DO NOT APPLY "Poly grip" OR ADHESIVES!!!   Take these medicines the morning of surgery with A SIP OF WATER: Amlodipine, Gabapentin, Morphine, Lamotrigine, Synthroid, Atorvastatin, Omeprazole                               You may not have any metal on your body including hair pins, jewelry, and body piercing             Do not wear make-up, lotions, powders, perfumes, or deodorant  Do not wear nail polish including gel and S&S, artificial/acrylic nails, or any other type of covering on natural nails including finger and toenails. If you have artificial nails, gel coating, etc. that needs to be removed by a nail salon please have this removed prior to surgery or surgery may need to be canceled/ delayed if the surgeon/ anesthesia feels like they are unable to be safely monitored.   Do not shave  48 hours prior to surgery.    Do not bring valuables to the hospital. [facility] IS NOT             RESPONSIBLE   FOR VALUABLES.   Contacts, glasses, dentures or bridgework may not be worn into surgery.   Bring small overnight bag day of surgery.   DO NOT BRING YOUR HOME MEDICATIONS TO THE HOSPITAL. PHARMACY WILL DISPENSE MEDICATIONS LISTED ON YOUR MEDICATION LIST TO YOU DURING YOUR ADMISSION IN THE HOSPITAL!   Special Instructions: Bring a copy of your healthcare power of attorney and living will documents the day of surgery if you haven't scanned them before.              Please read over the following fact sheets you were given: IF YOU HAVE QUESTIONS ABOUT YOUR PRE-OP INSTRUCTIONS  PLEASE CALL [name]    If you received a COVID test during your pre-op visit  it is requested that you wear a mask when out in public, stay away from anyone that may not be feeling well and notify your surgeon if you develop symptoms. If you test positive for Covid or have been in contact with anyone that has tested positive in the last 10 days please notify you surgeon.      Pre-operative 5 CHG Bath Instructions   You can play a key role in reducing the risk of infection after surgery. Your skin needs to be as free of germs as possible. You can reduce the number of germs on your skin by washing with CHG (chlorhexidine gluconate) soap before surgery. CHG is an antiseptic soap that kills germs and continues to kill germs even after washing.   DO NOT use if you have an allergy to chlorhexidine/CHG or antibacterial soaps. If your skin becomes reddened or irritated, stop using the CHG and notify one of our RNs at [phone].   Please shower with the CHG soap starting 4 days before surgery using the following schedule:     Please keep in mind the following:  DO NOT shave, including legs and underarms, starting the day of your first shower.   You may shave your face at any point before/day of surgery.  Place clean sheets on your bed the day you start using CHG soap. Use a clean washcloth (not used since being washed) for each shower. DO NOT sleep with pets once you start using the CHG.   CHG Shower Instructions:  If you choose to wash your hair and private area, wash first with your normal shampoo/soap.  After you use shampoo/soap, rinse your hair and body thoroughly to remove shampoo/soap residue.  Turn the water OFF and apply about 3 tablespoons (45 ml) of CHG soap to a CLEAN washcloth.  Apply CHG soap ONLY FROM YOUR NECK DOWN TO YOUR TOES (washing for 3-5 minutes)  DO NOT use CHG soap on face, private areas, open wounds, or sores.  Pay special attention to the area where  your surgery is being performed.  If you are having back surgery, having someone wash your back for you may be helpful. Wait 2 minutes after CHG soap is applied, then you may rinse off the CHG soap.  Pat dry with a clean towel  Put on clean clothes/pajamas   If you choose to wear lotion, please use ONLY the CHG-compatible lotions on the back of this paper.     Additional instructions for the day of surgery: DO NOT APPLY any lotions, deodorants, cologne, or perfumes.   Put on clean/comfortable clothes.  Brush your teeth.  Ask your nurse before applying any prescription medications to the skin.      CHG Compatible Lotions   Aveeno Moisturizing lotion  Cetaphil Moisturizing Cream  Cetaphil Moisturizing Lotion  Clairol Herbal Essence Moisturizing Lotion, Dry Skin  Clairol Herbal Essence Moisturizing Lotion, Extra Dry Skin  Clairol Herbal Essence Moisturizing Lotion, Normal Skin  Curel Age Defying Therapeutic Moisturizing Lotion with Alpha Hydroxy  Curel Extreme Care Body Lotion  Curel Soothing Hands Moisturizing Hand Lotion  Curel Therapeutic Moisturizing Cream, Fragrance-Free  Curel Therapeutic Moisturizing Lotion, Fragrance-Free  Curel Therapeutic Moisturizing Lotion, Original Formula  Eucerin Daily Replenishing Lotion  Eucerin Dry Skin Therapy Plus Alpha Hydroxy Crme  Eucerin Dry Skin Therapy Plus Alpha Hydroxy Lotion  Eucerin Original Crme  Eucerin Original Lotion  Eucerin Plus Crme Eucerin Plus Lotion  Eucerin TriLipid Replenishing Lotion  Keri Anti-Bacterial Hand Lotion  Keri Deep Conditioning Original Lotion Dry Skin Formula Softly Scented  Keri Deep Conditioning Original Lotion, Fragrance Free Sensitive Skin Formula  Keri Lotion Fast Absorbing Fragrance Free Sensitive Skin Formula  Keri Lotion Fast Absorbing Softly Scented Dry Skin Formula  Keri Original Lotion  Keri Skin Renewal Lotion Keri Silky Smooth Lotion  Keri Silky Smooth Sensitive Skin Lotion  Nivea Body  Creamy Conditioning Oil  Nivea Body Extra Enriched Lotion  Nivea Body Original Lotion  Nivea Body Sheer Moisturizing Lotion Nivea Crme  Nivea Skin Firming Lotion  NutraDerm 30 Skin Lotion  NutraDerm Skin Lotion  NutraDerm Therapeutic Skin Cream  NutraDerm Therapeutic Skin Lotion  ProShield Protective Hand Cream  Provon moisturizing lotion   Incentive Spirometer  An incentive spirometer is a tool that can help keep your lungs clear and active. This tool measures how well you are filling your lungs with each breath. Taking long deep breaths may help reverse or decrease the chance of developing breathing (pulmonary) problems (especially infection) following: A long period of time when you are unable to move or be active. BEFORE THE PROCEDURE  If the spirometer includes an indicator to show your best effort, your nurse or respiratory therapist will set it to a desired goal. If possible, sit up straight or lean slightly forward. Try not to slouch. Hold the incentive spirometer in an upright position. INSTRUCTIONS FOR USE  Sit on the edge of your bed if possible, or sit up as far as you can in bed or on a chair. Hold  the incentive spirometer in an upright position. Breathe out normally. Place the mouthpiece in your mouth and seal your lips tightly around it. Breathe in slowly and as deeply as possible, raising the piston or the ball toward the top of the column. Hold your breath for 3-5 seconds or for as long as possible. Allow the piston or ball to fall to the bottom of the column. Remove the mouthpiece from your mouth and breathe out normally. Rest for a few seconds and repeat Steps 1 through 7 at least 10 times every 1-2 hours when you are awake. Take your time and take a few normal breaths between deep breaths. The spirometer may include an indicator to show your best effort. Use the indicator as a goal to work toward during each repetition. After each set of 10 deep breaths, practice  coughing to be sure your lungs are clear. If you have an incision (the cut made at the time of surgery), support your incision when coughing by placing a pillow or rolled up towels firmly against it. Once you are able to get out of bed, walk around indoors and cough well. You may stop using the incentive spirometer when instructed by your caregiver.  RISKS AND COMPLICATIONS Take your time so you do not get dizzy or light-headed. If you are in pain, you may need to take or ask for pain medication before doing incentive spirometry. It is harder to take a deep breath if you are having pain. AFTER USE Rest and breathe slowly and easily. It can be helpful to keep track of a log of your progress. Your caregiver can provide you with a simple table to help with this. If you are using the spirometer at home, follow these instructions: SEEK MEDICAL CARE IF:  You are having difficultly using the spirometer. You have trouble using the spirometer as often as instructed. Your pain medication is not giving enough relief while using the spirometer. You develop fever of 100.5 F (38.1 C) or higher. SEEK IMMEDIATE MEDICAL CARE IF:  You cough up bloody sputum that had not been present before. You develop fever of 102 F (38.9 C) or greater. You develop worsening pain at or near the incision site. MAKE SURE YOU:  Understand these instructions. Will watch your condition. Will get help right away if you are not doing well or get worse. Document Released: 12/05/2006 Document Revised: 10/17/2011 Document Reviewed: 02/05/2007 Exit[employer] Patient Information 2014 Exit[employer], [place].   ________________________________________________________________________ WHAT IS A BLOOD TRANSFUSION? Blood Transfusion Information  A transfusion is the replacement of blood or some of its parts. Blood is made up of multiple cells which provide different functions. Red blood cells carry oxygen and are used for blood loss  replacement. White blood cells fight against infection. Platelets control bleeding. Plasma helps clot blood. Other blood products are available for specialized needs, such as hemophilia or other clotting disorders. BEFORE THE TRANSFUSION  Who gives blood for transfusions?  Healthy volunteers who are fully evaluated to make sure their blood is safe. This is blood bank blood. Transfusion therapy is the safest it has ever been in the practice of medicine. Before blood is taken from a donor, a complete history is taken to make sure that person has no history of diseases nor engages in risky social behavior (examples are intravenous drug use or sexual activity with multiple partners). The donor's travel history is screened to minimize risk of transmitting infections, such as malaria. The donated blood is tested for signs  of infectious diseases, such as HIV and hepatitis. The blood is then tested to be sure it is compatible with you in order to minimize the chance of a transfusion reaction. If you or a relative donates blood, this is often done in anticipation of surgery and is not appropriate for emergency situations. It takes many days to process the donated blood. RISKS AND COMPLICATIONS Although transfusion therapy is very safe and saves many lives, the main dangers of transfusion include:  Getting an infectious disease. Developing a transfusion reaction. This is an allergic reaction to something in the blood you were given. Every precaution is taken to prevent this. The decision to have a blood transfusion has been considered carefully by your caregiver before blood is given. Blood is not given unless the benefits outweigh the risks. AFTER THE TRANSFUSION Right after receiving a blood transfusion, you will usually feel much better and more energetic. This is especially true if your red blood cells have gotten low (anemic). The transfusion raises the level of the red blood cells which carry oxygen, and  this usually causes an energy increase. The nurse administering the transfusion will monitor you carefully for complications. HOME CARE INSTRUCTIONS  No special instructions are needed after a transfusion. You may find your energy is better. Speak with your caregiver about any limitations on activity for underlying diseases you may have. SEEK MEDICAL CARE IF:  Your condition is not improving after your transfusion. You develop redness or irritation at the intravenous (IV) site. SEEK IMMEDIATE MEDICAL CARE IF:  Any of the following symptoms occur over the next 12 hours: Shaking chills. You have a temperature by mouth above 102 F (38.9 C), not controlled by medicine. Chest, back, or muscle pain. People around you feel you are not acting correctly or are confused. Shortness of breath or difficulty breathing. Dizziness and fainting. You get a rash or develop hives. You have a decrease in urine output. Your urine turns a dark color or changes to pink, red, or brown. Any of the following symptoms occur over the next 10 days: You have a temperature by mouth above 102 F (38.9 C), not controlled by medicine. Shortness of breath. Weakness after normal activity. The white part of the eye turns yellow (jaundice). You have a decrease in the amount of urine or are urinating less often. Your urine turns a dark color or changes to pink, red, or brown. Document Released: 07/22/2000 Document Revised: 10/17/2011 Document Reviewed: 03/10/2008 [facility] Patient Information 2014 [place], [place].  _______________________________________________________________________

## 2023-02-21 ENCOUNTER — Encounter (HOSPITAL_COMMUNITY)
Admission: RE | Admit: 2023-02-21 | Discharge: 2023-02-21 | Disposition: A | Discharge status: Home / Self Care | Payer: Medicare Other | Source: Ambulatory Visit | Attending: Orthopaedic Surgery | Admitting: Orthopaedic Surgery

## 2023-02-21 ENCOUNTER — Other Ambulatory Visit: Payer: Self-pay

## 2023-02-21 ENCOUNTER — Encounter (HOSPITAL_COMMUNITY): Payer: Self-pay

## 2023-02-21 VITALS — BP 131/88 | HR 81 | Temp 98.3°F | Resp 16 | Ht 66.0 in | Wt 142.0 lb

## 2023-02-21 DIAGNOSIS — Z01818 Encounter for other preprocedural examination: Secondary | ICD-10-CM

## 2023-02-21 DIAGNOSIS — Z01812 Encounter for preprocedural laboratory examination: Secondary | ICD-10-CM | POA: Diagnosis present

## 2023-02-21 LAB — CBC
HCT: 45.8 % (ref 36.0–46.0)
Hemoglobin: 14.8 g/dL (ref 12.0–15.0)
MCH: 30.1 pg (ref 26.0–34.0)
MCHC: 32.3 g/dL (ref 30.0–36.0)
MCV: 93.3 fL (ref 80.0–100.0)
Platelets: 194 10*3/uL (ref 150–400)
RBC: 4.91 MIL/uL (ref 3.87–5.11)
RDW: 13.5 % (ref 11.5–15.5)
WBC: 7.3 10*3/uL (ref 4.0–10.5)
nRBC: 0 % (ref 0.0–0.2)

## 2023-02-21 LAB — COMPREHENSIVE METABOLIC PANEL
ALT: 15 U/L (ref 0–44)
AST: 16 U/L (ref 15–41)
Albumin: 3.5 g/dL (ref 3.5–5.0)
Alkaline Phosphatase: 116 U/L (ref 38–126)
Anion gap: 10 (ref 5–15)
BUN: 22 mg/dL (ref 8–23)
CO2: 27 mmol/L (ref 22–32)
Calcium: 9.1 mg/dL (ref 8.9–10.3)
Chloride: 103 mmol/L (ref 98–111)
Creatinine, Ser: 0.66 mg/dL (ref 0.44–1.00)
GFR, Estimated: >60 mL/min (ref >60)
Glucose, Bld: 101 mg/dL — ABNORMAL HIGH (ref 70–99)
Potassium: 4.7 mmol/L (ref 3.5–5.1)
Sodium: 140 mmol/L (ref 135–145)
Total Bilirubin: 0.6 mg/dL (ref 0.3–1.2)
Total Protein: 6.2 g/dL — ABNORMAL LOW (ref 6.5–8.1)

## 2023-02-21 LAB — SURGICAL PCR SCREEN
MRSA, PCR: NEGATIVE
Staphylococcus aureus: NEGATIVE

## 2023-02-23 ENCOUNTER — Encounter (HOSPITAL_COMMUNITY): Payer: Medicare Other

## 2023-02-23 DIAGNOSIS — T84061A Wear of articular bearing surface of internal prosthetic left hip joint, initial encounter: Secondary | ICD-10-CM | POA: Insufficient documentation

## 2023-02-23 NOTE — H&P (Signed)
Katie Gaines is an 67 y.o. female.   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  This was to treat avascular porosis of the hip.  Since then we have replaced her right knee and September 2023.  She had had no issues as a relates to her left hip but then recently had a different type of sensation and feeling within that left hip a few weekends ago and it felt "squishy" to her.  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  We are uncertain why she has had this catastrophic failure less than 2 years since her initial primary total hip arthroplasty.  She is not obese and is not participate in high impact aerobic activities.  There has been no recent illnesses or trauma that we are aware of.  There has been no evidence of infection.  Past Medical History:  Diagnosis Date   Anemia    Anxiety disorder    Cancer (HCC)    ear   Common bile duct stone    Graves disease    History of kidney stones    Hypertension    Hypothyroidism    Iron deficiency    Lymphedema of leg    bilateral    Past Surgical History:  Procedure Laterality Date   ANTERIOR LAT LUMBAR FUSION N/A 08/26/2019   Procedure: ANTERIOR LATERAL LUMBAR INTERBODY FUSION, LUMBAR ONE LUMBAR TWO;  Surgeon: Donalee Citrin, MD;  Location: [facility] OR;  Service: Neurosurgery;  Laterality: N/A;  anterolateral   APPENDECTOMY     BACK SURGERY  1987-2007   X 10 Surgery   CHOLECYSTECTOMY     ENTEROSCOPY  11/10/2011   Procedure: ENTEROSCOPY;  Surgeon: Louis Meckel, MD;  Location: [facility] ENDOSCOPY;  Service: Endoscopy;  Laterality: N/A;   ERCP  07/19/2011   Procedure: ENDOSCOPIC RETROGRADE CHOLANGIOPANCREATOGRAPHY (ERCP);  Surgeon: Louis Meckel, MD;   Location: [facility];  Service: Endoscopy;  Laterality: N/A;   ESOPHAGOGASTRODUODENOSCOPY  09/2018   FRACTURE SURGERY Left 2018   left arm fracture   HOT HEMOSTASIS  11/10/2011   Procedure: HOT HEMOSTASIS (ARGON PLASMA COAGULATION/BICAP);  Surgeon: Louis Meckel, MD;  Location: [facility];  Service: Endoscopy;  Laterality: N/A;   TOTAL HIP ARTHROPLASTY Left 06/25/2021   Procedure: LEFT TOTAL HIP ARTHROPLASTY ANTERIOR APPROACH;  Surgeon: Kathryne Hitch, MD;  Location: [facility] ORS;  Service: Orthopedics;  Laterality: Left;   TOTAL KNEE ARTHROPLASTY Right 04/26/2022   Procedure: RIGHT TOTAL KNEE ARTHROPLASTY;  Surgeon: Kathryne Hitch, MD;  Location: [facility] OR;  Service: Orthopedics;  Laterality: Right;    Family History  Problem Relation Age of Onset   Colon polyps Father    Diabetes Father    Brain cancer Mother    Colon cancer Neg Hx    Malignant hyperthermia Neg Hx    Social History:  reports that she quit smoking about 2 years ago. Her smoking use included cigarettes. She started smoking about 47 years ago. She has a 22.5 pack-year smoking history. She has never used smokeless tobacco. She reports current alcohol use. She reports that she does not use drugs.  Allergies: No Known Allergies  No medications prior to admission.  Results for orders placed or performed during the hospital encounter of 02/21/23 (from the past 48 hour(s))  CBC     Status: None   Collection Time: 02/21/23 10:48 AM  Result Value Ref Range   WBC 7.3 4.0 - 10.5 K/uL   RBC 4.91 3.87 - 5.11 MIL/uL   Hemoglobin 14.8 12.0 - 15.0 g/dL   HCT 40.9 81.1 - 91.4 %   MCV 93.3 80.0 - 100.0 fL   MCH 30.1 26.0 - 34.0 pg   MCHC 32.3 30.0 - 36.0 g/dL   RDW 78.2 95.6 - 21.3 %   Platelets 194 150 - 400 K/uL   nRBC 0.0 0.0 - 0.2 %    Comment: Performed at [facility], [address], [place], [place] [address]  Comprehensive metabolic panel     Status: Abnormal   Collection Time:  02/21/23 10:48 AM  Result Value Ref Range   Sodium 140 135 - 145 mmol/L   Potassium 4.7 3.5 - 5.1 mmol/L   Chloride 103 98 - 111 mmol/L   CO2 27 22 - 32 mmol/L   Glucose, Bld 101 (H) 70 - 99 mg/dL    Comment: Glucose reference range applies only to samples taken after fasting for at least 8 hours.   BUN 22 8 - 23 mg/dL   Creatinine, Ser 8.46 0.44 - 1.00 mg/dL   Calcium 9.1 8.9 - 96.2 mg/dL   Total Protein 6.2 (L) 6.5 - 8.1 g/dL   Albumin 3.5 3.5 - 5.0 g/dL   AST 16 15 - 41 U/L   ALT 15 0 - 44 U/L   Alkaline Phosphatase 116 38 - 126 U/L   Total Bilirubin 0.6 0.3 - 1.2 mg/dL   GFR, Estimated >95 >28 mL/min    Comment: (NOTE) Calculated using the CKD-EPI Creatinine Equation (2021)    Anion gap 10 5 - 15    Comment: Performed at [facility], [address], [place], [place] [address]  Surgical pcr screen     Status: None   Collection Time: 02/21/23 10:48 AM   Specimen: Nasal Mucosa; Nasal Swab  Result Value Ref Range   MRSA, PCR NEGATIVE NEGATIVE   Staphylococcus aureus NEGATIVE NEGATIVE    Comment: (NOTE) The Xpert SA Assay (FDA approved for NASAL specimens in patients 65 years of age and older), is one component of a comprehensive surveillance program. It is not intended to diagnose infection nor to guide or monitor treatment. Performed at [facility], [address] [place], [place] [address]   Type and screen [facility]     Status: None   Collection Time: 02/21/23 10:48 AM  Result Value Ref Range   ABO/RH(D) O POS    Antibody Screen NEG    Sample Expiration 03/07/2023,2359    Extend sample reason      NO TRANSFUSIONS OR PREGNANCY IN THE PAST 3 MONTHS Performed at [facility], [address], [place], [place] [address]    No results found.  Review of Systems  There were no vitals taken for this visit. Physical Exam Vitals reviewed.  Constitutional:      Appearance: Normal  appearance. She is normal weight.  HENT:     Head: Normocephalic and atraumatic.  Eyes:     Extraocular Movements: Extraocular movements intact.     Pupils: Pupils are equal, round, and reactive to light.  Cardiovascular:     Rate and Rhythm: Normal rate and regular rhythm.  Pulses: Normal pulses.  Pulmonary:     Effort: Pulmonary effort is normal.     Breath sounds: Normal breath sounds.  Abdominal:     Palpations: Abdomen is soft.  Musculoskeletal:     Cervical back: Normal range of motion and neck supple.     Left hip: Tenderness present. Decreased range of motion.  Neurological:     Mental Status: She is alert and oriented to person, place, and time.  Psychiatric:        Behavior: Behavior normal.      Assessment/Plan Failure of left hip polyethylene liner  The plan is to proceed to surgery for an open arthrotomy of the left hip and assessment of all the components followed by at minimum a polyliner exchange and hip ball exchange.  Intraoperative findings will certainly guide the potential for revising the acetabular component but a lot of this will depend again what is found during surgery.  A long and thorough discussion has been had with the patient about the risks and benefits of the surgery in significant detail.  Kathryne Hitch, MD 02/23/2023, 10:37 AM

## 2023-02-24 LAB — TYPE AND SCREEN
ABO/RH(D): O POS
Antibody Screen: NEGATIVE

## 2023-03-01 NOTE — Progress Notes (Signed)
Updated date of surgery: 02/24/2023  Updated time of arrival: 12:15 PM at [facility] Entrance Admitting Department  Patient denies any changes in allergies, medications, medical history since pre op appointment on: 02/21/23  Pre op instructions reviewed, follow up questions addressed and patient verbalized understanding at this time.

## 2023-03-06 ENCOUNTER — Inpatient Hospital Stay (HOSPITAL_COMMUNITY): Payer: Medicare Other | Admitting: Anesthesiology

## 2023-03-06 ENCOUNTER — Inpatient Hospital Stay (HOSPITAL_COMMUNITY)
Admission: RE | Admit: 2023-03-06 | Discharge: 2023-03-07 | DRG: 468 | Disposition: A | Discharge status: Home Health | Payer: Medicare Other | Source: Ambulatory Visit | Attending: Orthopaedic Surgery | Admitting: Orthopaedic Surgery

## 2023-03-06 ENCOUNTER — Inpatient Hospital Stay (HOSPITAL_COMMUNITY): Payer: Self-pay | Admitting: Anesthesiology

## 2023-03-06 ENCOUNTER — Inpatient Hospital Stay (HOSPITAL_COMMUNITY): Payer: Medicare Other

## 2023-03-06 ENCOUNTER — Encounter (HOSPITAL_COMMUNITY)
Admission: RE | Disposition: A | Discharge status: Home Health | Payer: Self-pay | Source: Ambulatory Visit | Attending: Orthopaedic Surgery

## 2023-03-06 ENCOUNTER — Other Ambulatory Visit: Payer: Self-pay

## 2023-03-06 ENCOUNTER — Encounter (HOSPITAL_COMMUNITY): Payer: Self-pay | Admitting: Orthopaedic Surgery

## 2023-03-06 DIAGNOSIS — T84061A Wear of articular bearing surface of internal prosthetic left hip joint, initial encounter: Principal | ICD-10-CM | POA: Diagnosis present

## 2023-03-06 DIAGNOSIS — I10 Essential (primary) hypertension: Secondary | ICD-10-CM

## 2023-03-06 DIAGNOSIS — Z79899 Other long term (current) drug therapy: Secondary | ICD-10-CM | POA: Diagnosis not present

## 2023-03-06 DIAGNOSIS — Z808 Family history of malignant neoplasm of other organs or systems: Secondary | ICD-10-CM | POA: Diagnosis not present

## 2023-03-06 DIAGNOSIS — Z833 Family history of diabetes mellitus: Secondary | ICD-10-CM

## 2023-03-06 DIAGNOSIS — Z87891 Personal history of nicotine dependence: Secondary | ICD-10-CM

## 2023-03-06 DIAGNOSIS — Y792 Prosthetic and other implants, materials and accessory orthopedic devices associated with adverse incidents: Secondary | ICD-10-CM | POA: Diagnosis present

## 2023-03-06 DIAGNOSIS — Z96651 Presence of right artificial knee joint: Secondary | ICD-10-CM | POA: Diagnosis present

## 2023-03-06 DIAGNOSIS — Z01818 Encounter for other preprocedural examination: Principal | ICD-10-CM

## 2023-03-06 DIAGNOSIS — E039 Hypothyroidism, unspecified: Secondary | ICD-10-CM

## 2023-03-06 DIAGNOSIS — Z96649 Presence of unspecified artificial hip joint: Secondary | ICD-10-CM

## 2023-03-06 DIAGNOSIS — Z7982 Long term (current) use of aspirin: Secondary | ICD-10-CM

## 2023-03-06 DIAGNOSIS — Z83719 Family history of colon polyps, unspecified: Secondary | ICD-10-CM | POA: Diagnosis not present

## 2023-03-06 DIAGNOSIS — Z79890 Hormone replacement therapy (postmenopausal): Secondary | ICD-10-CM | POA: Diagnosis not present

## 2023-03-06 DIAGNOSIS — E0500 Thyrotoxicosis with diffuse goiter without thyrotoxic crisis or storm: Secondary | ICD-10-CM | POA: Diagnosis present

## 2023-03-06 DIAGNOSIS — Z981 Arthrodesis status: Secondary | ICD-10-CM | POA: Diagnosis not present

## 2023-03-06 HISTORY — PX: ANTERIOR HIP REVISION: SHX6527

## 2023-03-06 LAB — TYPE AND SCREEN
ABO/RH(D): O POS
Antibody Screen: NEGATIVE

## 2023-03-06 SURGERY — REVISION, TOTAL ARTHROPLASTY, HIP, ANTERIOR APPROACH
Anesthesia: General | Site: Hip | Laterality: Left

## 2023-03-06 MED ORDER — ONDANSETRON HCL 4 MG/2ML IJ SOLN
INTRAMUSCULAR | Status: AC
  Filled 2023-03-06: qty 2

## 2023-03-06 MED ORDER — ASPIRIN 81 MG PO CHEW
81.0000 mg | CHEWABLE_TABLET | Freq: Two times a day (BID) | ORAL | Status: DC
  Administered 2023-03-06 – 2023-03-07 (×2): 81 mg via ORAL
  Filled 2023-03-06 (×2): qty 1

## 2023-03-06 MED ORDER — DEXAMETHASONE SODIUM PHOSPHATE 10 MG/ML IJ SOLN
INTRAMUSCULAR | Status: AC
  Filled 2023-03-06: qty 1

## 2023-03-06 MED ORDER — METOCLOPRAMIDE HCL 5 MG/ML IJ SOLN: 5.0000 mg | Freq: Three times a day (TID) | INTRAMUSCULAR | Status: DC | PRN

## 2023-03-06 MED ORDER — ASPIRIN-ACETAMINOPHEN-CAFFEINE 250-250-65 MG PO TABS: 1.0000 | ORAL_TABLET | Freq: Four times a day (QID) | ORAL | Status: DC | PRN

## 2023-03-06 MED ORDER — PHENYLEPHRINE 80 MCG/ML (10ML) SYRINGE FOR IV PUSH (FOR BLOOD PRESSURE SUPPORT)
PREFILLED_SYRINGE | INTRAVENOUS | Status: DC | PRN
  Administered 2023-03-06: 80 ug via INTRAVENOUS

## 2023-03-06 MED ORDER — ONDANSETRON HCL 4 MG/2ML IJ SOLN
INTRAMUSCULAR | Status: DC | PRN
  Administered 2023-03-06: 4 mg via INTRAVENOUS

## 2023-03-06 MED ORDER — OXYCODONE HCL 5 MG PO TABS
5.0000 mg | ORAL_TABLET | ORAL | Status: DC | PRN
  Administered 2023-03-06 – 2023-03-07 (×4): 10 mg via ORAL
  Filled 2023-03-06 (×4): qty 2

## 2023-03-06 MED ORDER — FENTANYL CITRATE (PF) 100 MCG/2ML IJ SOLN
INTRAMUSCULAR | Status: DC | PRN
  Administered 2023-03-06: 25 ug via INTRAVENOUS
  Administered 2023-03-06: 50 ug via INTRAVENOUS
  Administered 2023-03-06: 25 ug via INTRAVENOUS

## 2023-03-06 MED ORDER — AMITRIPTYLINE HCL 25 MG PO TABS
25.0000 mg | ORAL_TABLET | Freq: Every day | ORAL | Status: DC
  Administered 2023-03-06: 25 mg via ORAL
  Filled 2023-03-06 (×2): qty 1

## 2023-03-06 MED ORDER — LEVOTHYROXINE SODIUM 125 MCG PO TABS
125.0000 ug | ORAL_TABLET | Freq: Every day | ORAL | Status: DC
  Administered 2023-03-07: 125 ug via ORAL
  Filled 2023-03-06: qty 1

## 2023-03-06 MED ORDER — KETOROLAC TROMETHAMINE 30 MG/ML IJ SOLN
INTRAMUSCULAR | Status: AC
  Filled 2023-03-06: qty 1

## 2023-03-06 MED ORDER — CEFAZOLIN SODIUM-DEXTROSE 2-4 GM/100ML-% IV SOLN
2.0000 g | INTRAVENOUS | Status: AC
  Administered 2023-03-06: 2 g via INTRAVENOUS
  Filled 2023-03-06: qty 100

## 2023-03-06 MED ORDER — FENTANYL CITRATE PF 50 MCG/ML IJ SOSY
PREFILLED_SYRINGE | INTRAMUSCULAR | Status: AC
  Administered 2023-03-06: 25 ug via INTRAVENOUS
  Filled 2023-03-06: qty 3

## 2023-03-06 MED ORDER — HYDROMORPHONE HCL 2 MG/ML IJ SOLN
INTRAMUSCULAR | Status: AC
  Filled 2023-03-06: qty 1

## 2023-03-06 MED ORDER — ONDANSETRON HCL 4 MG/2ML IJ SOLN: 4.0000 mg | Freq: Once | INTRAMUSCULAR | Status: DC | PRN

## 2023-03-06 MED ORDER — SODIUM CHLORIDE 0.9 % IV SOLN: INTRAVENOUS | Status: DC

## 2023-03-06 MED ORDER — METHOCARBAMOL 500 MG PO TABS
500.0000 mg | ORAL_TABLET | Freq: Four times a day (QID) | ORAL | Status: DC | PRN
  Administered 2023-03-06: 500 mg via ORAL
  Filled 2023-03-06: qty 1

## 2023-03-06 MED ORDER — MIDAZOLAM HCL 5 MG/5ML IJ SOLN
INTRAMUSCULAR | Status: DC | PRN
  Administered 2023-03-06: 2 mg via INTRAVENOUS

## 2023-03-06 MED ORDER — FENTANYL CITRATE PF 50 MCG/ML IJ SOSY
25.0000 ug | PREFILLED_SYRINGE | INTRAMUSCULAR | Status: DC | PRN
  Administered 2023-03-06 (×2): 50 ug via INTRAVENOUS
  Administered 2023-03-06: 25 ug via INTRAVENOUS

## 2023-03-06 MED ORDER — DOCUSATE SODIUM 100 MG PO CAPS
100.0000 mg | ORAL_CAPSULE | Freq: Two times a day (BID) | ORAL | Status: DC
  Administered 2023-03-06 – 2023-03-07 (×2): 100 mg via ORAL
  Filled 2023-03-06 (×2): qty 1

## 2023-03-06 MED ORDER — ALUM & MAG HYDROXIDE-SIMETH 200-200-20 MG/5ML PO SUSP: 30.0000 mL | ORAL | Status: DC | PRN

## 2023-03-06 MED ORDER — HYDROMORPHONE HCL 1 MG/ML IJ SOLN
0.2500 mg | INTRAMUSCULAR | Status: DC | PRN
  Administered 2023-03-06 (×3): 0.5 mg via INTRAVENOUS

## 2023-03-06 MED ORDER — KETOROLAC TROMETHAMINE 30 MG/ML IJ SOLN
INTRAMUSCULAR | Status: DC | PRN
  Administered 2023-03-06: 30 mg via INTRAVENOUS

## 2023-03-06 MED ORDER — HYDROMORPHONE HCL 1 MG/ML IJ SOLN
1.0000 mg | Freq: Once | INTRAMUSCULAR | Status: AC
  Administered 2023-03-06: 1 mg via INTRAVENOUS

## 2023-03-06 MED ORDER — ORAL CARE MOUTH RINSE: 15.0000 mL | Freq: Once | OROMUCOSAL | Status: AC

## 2023-03-06 MED ORDER — PHENOL 1.4 % MT LIQD: 1.0000 | OROMUCOSAL | Status: DC | PRN

## 2023-03-06 MED ORDER — MIDAZOLAM HCL 2 MG/2ML IJ SOLN
INTRAMUSCULAR | Status: AC
  Filled 2023-03-06: qty 2

## 2023-03-06 MED ORDER — ONDANSETRON HCL 4 MG/2ML IJ SOLN: 4.0000 mg | Freq: Four times a day (QID) | INTRAMUSCULAR | Status: DC | PRN

## 2023-03-06 MED ORDER — ACETAMINOPHEN 10 MG/ML IV SOLN
INTRAVENOUS | Status: AC
  Filled 2023-03-06: qty 100

## 2023-03-06 MED ORDER — HYDROMORPHONE HCL 1 MG/ML IJ SOLN
INTRAMUSCULAR | Status: DC | PRN
  Administered 2023-03-06: 1 mg via INTRAVENOUS

## 2023-03-06 MED ORDER — AMLODIPINE BESYLATE 5 MG PO TABS
5.0000 mg | ORAL_TABLET | Freq: Every day | ORAL | Status: DC
  Administered 2023-03-07: 5 mg via ORAL
  Filled 2023-03-06: qty 1

## 2023-03-06 MED ORDER — PROPOFOL 10 MG/ML IV BOLUS
INTRAVENOUS | Status: DC | PRN
  Administered 2023-03-06: 200 mg via INTRAVENOUS

## 2023-03-06 MED ORDER — METOCLOPRAMIDE HCL 5 MG PO TABS: 5.0000 mg | ORAL_TABLET | Freq: Three times a day (TID) | ORAL | Status: DC | PRN

## 2023-03-06 MED ORDER — MORPHINE SULFATE 15 MG PO TABS
30.0000 mg | ORAL_TABLET | Freq: Two times a day (BID) | ORAL | Status: DC
  Administered 2023-03-06 – 2023-03-07 (×2): 30 mg via ORAL
  Filled 2023-03-06 (×2): qty 2

## 2023-03-06 MED ORDER — ACETAMINOPHEN 160 MG/5ML PO SOLN: 325.0000 mg | ORAL | Status: DC | PRN

## 2023-03-06 MED ORDER — LAMOTRIGINE 25 MG PO TABS
150.0000 mg | ORAL_TABLET | Freq: Two times a day (BID) | ORAL | Status: DC
  Administered 2023-03-06 – 2023-03-07 (×2): 150 mg via ORAL
  Filled 2023-03-06 (×2): qty 2

## 2023-03-06 MED ORDER — HYDROMORPHONE HCL 1 MG/ML IJ SOLN
0.5000 mg | INTRAMUSCULAR | Status: DC | PRN
  Administered 2023-03-06: 1 mg via INTRAVENOUS
  Filled 2023-03-06: qty 1

## 2023-03-06 MED ORDER — MENTHOL 3 MG MT LOZG: 1.0000 | LOZENGE | OROMUCOSAL | Status: DC | PRN

## 2023-03-06 MED ORDER — FENTANYL CITRATE (PF) 100 MCG/2ML IJ SOLN
INTRAMUSCULAR | Status: AC
  Filled 2023-03-06: qty 2

## 2023-03-06 MED ORDER — PHENYLEPHRINE 80 MCG/ML (10ML) SYRINGE FOR IV PUSH (FOR BLOOD PRESSURE SUPPORT)
PREFILLED_SYRINGE | INTRAVENOUS | Status: AC
  Filled 2023-03-06: qty 10

## 2023-03-06 MED ORDER — HYDROMORPHONE HCL 1 MG/ML IJ SOLN
INTRAMUSCULAR | Status: AC
  Filled 2023-03-06: qty 1

## 2023-03-06 MED ORDER — METHOCARBAMOL 500 MG IVPB - SIMPLE MED: 500.0000 mg | Freq: Four times a day (QID) | INTRAVENOUS | Status: DC | PRN

## 2023-03-06 MED ORDER — CEFAZOLIN SODIUM-DEXTROSE 1-4 GM/50ML-% IV SOLN
1.0000 g | Freq: Four times a day (QID) | INTRAVENOUS | Status: AC
  Administered 2023-03-06 – 2023-03-07 (×2): 1 g via INTRAVENOUS
  Filled 2023-03-06 (×2): qty 50

## 2023-03-06 MED ORDER — EPHEDRINE SULFATE-NACL 50-0.9 MG/10ML-% IV SOSY
PREFILLED_SYRINGE | INTRAVENOUS | Status: DC | PRN
  Administered 2023-03-06: 2.5 mg via INTRAVENOUS
  Administered 2023-03-06: 5 mg via INTRAVENOUS
  Administered 2023-03-06: 10 mg via INTRAVENOUS
  Administered 2023-03-06: 5 mg via INTRAVENOUS

## 2023-03-06 MED ORDER — GABAPENTIN 400 MG PO CAPS
800.0000 mg | ORAL_CAPSULE | Freq: Four times a day (QID) | ORAL | Status: DC
  Administered 2023-03-06 – 2023-03-07 (×2): 800 mg via ORAL
  Filled 2023-03-06 (×2): qty 2

## 2023-03-06 MED ORDER — VITAMIN D (ERGOCALCIFEROL) 1.25 MG (50000 UNIT) PO CAPS: 50000.0000 [IU] | ORAL_CAPSULE | ORAL | Status: DC

## 2023-03-06 MED ORDER — PANTOPRAZOLE SODIUM 40 MG PO TBEC
40.0000 mg | DELAYED_RELEASE_TABLET | Freq: Every day | ORAL | Status: DC
  Administered 2023-03-06 – 2023-03-07 (×2): 40 mg via ORAL
  Filled 2023-03-06 (×2): qty 1

## 2023-03-06 MED ORDER — PROPOFOL 10 MG/ML IV BOLUS
INTRAVENOUS | Status: AC
  Filled 2023-03-06: qty 20

## 2023-03-06 MED ORDER — HYDROMORPHONE HCL 1 MG/ML IJ SOLN
INTRAMUSCULAR | Status: AC
  Administered 2023-03-06: 0.5 mg via INTRAVENOUS
  Filled 2023-03-06: qty 2

## 2023-03-06 MED ORDER — DIPHENHYDRAMINE HCL 12.5 MG/5ML PO ELIX: 12.5000 mg | ORAL_SOLUTION | ORAL | Status: DC | PRN

## 2023-03-06 MED ORDER — MEPERIDINE HCL 50 MG/ML IJ SOLN: 6.2500 mg | INTRAMUSCULAR | Status: DC | PRN

## 2023-03-06 MED ORDER — OXYCODONE HCL 5 MG/5ML PO SOLN: 5.0000 mg | Freq: Once | ORAL | Status: DC | PRN

## 2023-03-06 MED ORDER — SODIUM CHLORIDE 0.9 % IR SOLN
Status: DC | PRN
  Administered 2023-03-06 (×2): 1000 mL

## 2023-03-06 MED ORDER — EPHEDRINE 5 MG/ML INJ
INTRAVENOUS | Status: AC
  Filled 2023-03-06: qty 5

## 2023-03-06 MED ORDER — ACETAMINOPHEN 10 MG/ML IV SOLN
INTRAVENOUS | Status: DC | PRN
Start: 2023-03-06 — End: 2023-03-06
  Administered 2023-03-06: 1000 mg via INTRAVENOUS

## 2023-03-06 MED ORDER — ATORVASTATIN CALCIUM 10 MG PO TABS
10.0000 mg | ORAL_TABLET | Freq: Every day | ORAL | Status: DC
  Administered 2023-03-07: 10 mg via ORAL
  Filled 2023-03-06: qty 1

## 2023-03-06 MED ORDER — ACETAMINOPHEN 325 MG PO TABS: 325.0000 mg | ORAL_TABLET | Freq: Four times a day (QID) | ORAL | Status: DC | PRN

## 2023-03-06 MED ORDER — LACTATED RINGERS IV SOLN: INTRAVENOUS | Status: DC

## 2023-03-06 MED ORDER — OXYCODONE HCL 5 MG PO TABS: 5.0000 mg | ORAL_TABLET | Freq: Once | ORAL | Status: DC | PRN

## 2023-03-06 MED ORDER — LIDOCAINE HCL (CARDIAC) PF 100 MG/5ML IV SOSY
PREFILLED_SYRINGE | INTRAVENOUS | Status: DC | PRN
  Administered 2023-03-06: 60 mg via INTRAVENOUS

## 2023-03-06 MED ORDER — DEXAMETHASONE SODIUM PHOSPHATE 4 MG/ML IJ SOLN
INTRAMUSCULAR | Status: DC | PRN
  Administered 2023-03-06: 8 mg via INTRAVENOUS

## 2023-03-06 MED ORDER — OXYCODONE HCL 5 MG PO TABS: 10.0000 mg | ORAL_TABLET | ORAL | Status: DC | PRN

## 2023-03-06 MED ORDER — CHLORHEXIDINE GLUCONATE 0.12 % MT SOLN
15.0000 mL | Freq: Once | OROMUCOSAL | Status: AC
  Administered 2023-03-06: 15 mL via OROMUCOSAL

## 2023-03-06 MED ORDER — ONDANSETRON HCL 4 MG PO TABS: 4.0000 mg | ORAL_TABLET | Freq: Four times a day (QID) | ORAL | Status: DC | PRN

## 2023-03-06 MED ORDER — ESMOLOL HCL 100 MG/10ML IV SOLN
INTRAVENOUS | Status: AC
  Filled 2023-03-06: qty 10

## 2023-03-06 MED ORDER — ACETAMINOPHEN 325 MG PO TABS: 325.0000 mg | ORAL_TABLET | ORAL | Status: DC | PRN

## 2023-03-06 MED ORDER — TRANEXAMIC ACID-NACL 1000-0.7 MG/100ML-% IV SOLN
1000.0000 mg | INTRAVENOUS | Status: AC
  Administered 2023-03-06: 1000 mg via INTRAVENOUS
  Filled 2023-03-06: qty 100

## 2023-03-06 MED ORDER — LIDOCAINE HCL (PF) 2 % IJ SOLN
INTRAMUSCULAR | Status: AC
  Filled 2023-03-06: qty 5

## 2023-03-06 SURGICAL SUPPLY — 42 items
APL SKNCLS STERI-STRIP NONHPOA (GAUZE/BANDAGES/DRESSINGS)
BAG COUNTER SPONGE SURGICOUNT (BAG) IMPLANT
BAG SPEC THK2 15X12 ZIP CLS (MISCELLANEOUS)
BAG SPNG CNTER NS LX DISP (BAG)
BAG ZIPLOCK 12X15 (MISCELLANEOUS) IMPLANT
BALL HIP CERAMIC 32MM PLUS 1 IMPLANT
BENZOIN TINCTURE PRP APPL 2/3 (GAUZE/BANDAGES/DRESSINGS) IMPLANT
BLADE SAW SGTL 18X1.27X75 (BLADE) ×2 IMPLANT
BRUSH FEMORAL CANAL (MISCELLANEOUS) ×2 IMPLANT
COVER PERINEAL POST (MISCELLANEOUS) ×2 IMPLANT
COVER SURGICAL LIGHT HANDLE (MISCELLANEOUS) ×2 IMPLANT
DRAPE FOOT SWITCH (DRAPES) ×2 IMPLANT
DRAPE STERI IOBAN 125X83 (DRAPES) ×2 IMPLANT
DRAPE U-SHAPE 47X51 STRL (DRAPES) ×4 IMPLANT
DRSG AQUACEL AG ADV 3.5X10 (GAUZE/BANDAGES/DRESSINGS) ×2 IMPLANT
DURAPREP 26ML APPLICATOR (WOUND CARE) ×2 IMPLANT
ELECT REM PT RETURN 15FT ADLT (MISCELLANEOUS) ×2 IMPLANT
GAUZE XEROFORM 1X8 LF (GAUZE/BANDAGES/DRESSINGS) IMPLANT
GLOVE BIO SURGEON STRL SZ7.5 (GLOVE) ×2 IMPLANT
GLOVE BIOGEL PI IND STRL 8 (GLOVE) ×4 IMPLANT
GLOVE ECLIPSE 8.0 STRL XLNG CF (GLOVE) ×2 IMPLANT
GOWN STRL REUS W/ TWL XL LVL3 (GOWN DISPOSABLE) ×4 IMPLANT
GOWN STRL REUS W/TWL XL LVL3 (GOWN DISPOSABLE) ×2
HANDPIECE INTERPULSE COAX TIP (DISPOSABLE) ×1
HIP BALL CERAMIC 32MM PLUS 1 ×1 IMPLANT
HOLDER FOLEY CATH W/STRAP (MISCELLANEOUS) ×2 IMPLANT
KIT TURNOVER KIT A (KITS) IMPLANT
LINER ACET PNNCL PLUS4 NEUTRAL (Hips) IMPLANT
PACK ANTERIOR HIP CUSTOM (KITS) ×2 IMPLANT
PINNACLE PLUS 4 NEUTRAL (Hips) ×1 IMPLANT
SET HNDPC FAN SPRY TIP SCT (DISPOSABLE) ×2 IMPLANT
STAPLER VISISTAT 35W (STAPLE) IMPLANT
STRIP CLOSURE SKIN 1/2X4 (GAUZE/BANDAGES/DRESSINGS) IMPLANT
SUT ETHIBOND NAB CT1 #1 30IN (SUTURE) ×2 IMPLANT
SUT MNCRL AB 4-0 PS2 18 (SUTURE) IMPLANT
SUT VIC AB 0 CT1 36 (SUTURE) ×2 IMPLANT
SUT VIC AB 1 CT1 36 (SUTURE) ×2 IMPLANT
SUT VIC AB 2-0 CT1 27 (SUTURE) ×2
SUT VIC AB 2-0 CT1 TAPERPNT 27 (SUTURE) ×4 IMPLANT
TRAY FOLEY MTR SLVR 16FR STAT (SET/KITS/TRAYS/PACK) ×2 IMPLANT
WATER STERILE IRR 1000ML POUR (IV SOLUTION) ×4 IMPLANT
YANKAUER SUCT BULB TIP NO VENT (SUCTIONS) ×2 IMPLANT

## 2023-03-06 NOTE — Transfer of Care (Signed)
Immediate Anesthesia Transfer of Care Note  Patient: ZHARIYAH BARRILE  Procedure(s) Performed: LEFT HIP POLY-LINER AND HIP BALL REVISION (Left: Hip)  Patient Location: PACU  Anesthesia Type:General  Level of Consciousness: alert  and oriented  Airway & Oxygen Therapy: Patient Spontanous Breathing and Patient connected to nasal cannula oxygen  Post-op Assessment: Report given to RN and Post -op Vital signs reviewed and stable  Post vital signs: Reviewed and stable  Last Vitals:  Vitals Value Taken Time  BP 124/82 03/06/23 1655  Temp    Pulse 105 03/06/23 1657  Resp 18 03/06/23 1657  SpO2 94 % 03/06/23 1657  Vitals shown include unfiled device data.  Last Pain:  Vitals:   03/06/23 1238  TempSrc:   PainSc: 4          Complications: No notable events documented.

## 2023-03-06 NOTE — Anesthesia Postprocedure Evaluation (Signed)
Anesthesia Post Note  Patient: Katie Gaines  Procedure(s) Performed: LEFT HIP POLY-LINER AND HIP BALL REVISION (Left: Hip)     Patient location during evaluation: PACU Anesthesia Type: General Level of consciousness: awake and alert Pain management: pain level controlled Vital Signs Assessment: post-procedure vital signs reviewed and stable Respiratory status: spontaneous breathing, nonlabored ventilation and respiratory function stable Cardiovascular status: blood pressure returned to baseline Postop Assessment: no apparent nausea or vomiting Anesthetic complications: no   No notable events documented.  Last Vitals:  Vitals:   03/06/23 1853 03/06/23 2123  BP: 124/75 136/77  Pulse: 70 77  Resp: 14 18  Temp: 36.6 C 36.6 C  SpO2: 97% 97%    Last Pain:  Vitals:   03/06/23 2228  TempSrc:   PainSc: 6                  Shanda Howells

## 2023-03-06 NOTE — Op Note (Signed)
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 Implants: Implant Name Type Inv. Item Serial No. Manufacturer Lot No. LRB No. Used Action  LINER ACETABULAR 32X50 - [id number] Liner LINER ACETABULAR 32X50  [employer] [id number] Left 1 Explanted  PINNACLE PLUS 4 NEUTRAL - [id number] Hips PINNACLE PLUS 4 NEUTRAL  [employer]PU[employer]TH[employer] [id number] Left 1 Implanted  HIP BALL ARTICU DEPUY - [id number] Hips HEAD FEM STD 32X+1 STRL  DEPU[employer]TH[employer] [id number] Left 1 Explanted  HIP BALL CERAMIC PLUS 1 - [id number]  HIP BALL CERAMIC PLUS 1  [employer]PU[employer]TH[employer] [id number] Left 1 Implanted   Findings: Polyethylene liner found to be dislodged in supine from locking mechanism   Surgeon: Vanita Panda. Magnus Ivan, MD Assistant: Rexene Edison, PA-C lower  Anesthesia: General EBL: 200 cc Antibiotics: 2 g IV Ancef Complications: None  Indications: The patient is a 67 year old female who underwent a direct anterior total hip arthroplasty of her left hip back in November 2022.  This was secondary to avascular necrosis.  She had done well in her postoperative course and we x-rayed her hip in November 2022 and again in January 2023 and routine follow-up.  Her x-rays continue to show normal position of the implants and she had not had any issues.  Later in 2023 she underwent a successful right knee replacement.  We also saw her in routine follow-up for that.  Several weeks ago she really came to the office after noting some type of odd feeling in her left hip.  She has not had any type of injuries.  X-rays were obtained and it showed an obvious issue with the polyliner.  The femoral head was high riding within the acetabulum indicating probably failure or poly spine out.  We recommended a open left hip arthrotomy with assessing the polyliner as well as the  implants and revising the failed implants accordingly.  The risk and benefits of the surgery were explained in significant detail and informed consent has been obtained.  Procedure description: After informed consent was obtained and the appropriate left hip was marked, the patient was brought to the operating room and general anesthesia was obtained while she was on the stretcher.  She was then placed supine on the Hana fracture table with a perineal post in place in both legs and inline skeletal traction devices but no traction applied.  We assessed her left hip and pelvis radiographically.  We then prepped the left hip with DuraPrep and sterile drapes.  A timeout was called and she was identified as the correct patient and correct left hip.  We then made an incision through her previous anterior hip incision and dissected down to the tensor fascia lata muscle.  The tensor fascia was then divided longitudinally so could proceed with direct interposed the hip.  We were able to then identify the hip capsule and opened this up and did not find a significant effusion.  Right away that we could see that there was wear of the polyliner and definitely spat out the polyliner.  We were able to then dislocated the hip.  We removed significant scar tissue and tissue with metallosis from around the hip but there is no evidence of infection at all.  We then easily remove the polyethylene liner and it had become disengaged from the locking mechanism of the acetabulum.  Once the polyliner was removed we assessed the acetabulum  in its entirety.  It was intact and bone ingrown and showed fortunately no wear within the metal aspect of the liner.  We removed the previous hip ball as well and assessed the femoral component and it was also intact.  Once removed significant scar tissue from acetabulum we placed a new 32+4 polythene liner for size 50 acetabular component and make sure that this was secured in place checking it around  the entire acetabulum and it did not dislodge and did not spin.  It was appropriately secured.  We then placed a brand-new 32+1 ceramic revision hip ball.  This was reduced in the acetabulum we assessed it mechanically and radiographically and we are pleased with range of motion and stability.  We then irrigated the soft tissue again with normal saline solution using pulsatile lavage.  Remnants of the joint capsule were closed with interrupted #1 Ethibond suture followed by number Vicryl close the tensor fascia.  0 Vicryl was used to close deep tissue and 2-0 Vicryl was used to close subcutaneous tissue.  The skin was closed staples.  An Aquacel dressing was applied.  The patient was taken off the Hana table, awakened, extubated and taken to the recovery room in stable condition.  Go-cart PA-C did assist the entire case from beginning the end and his assistance was medically necessary and crucial for soft tissue management and retraction, helping guide implant placement and a layered closure of the wound.

## 2023-03-06 NOTE — Anesthesia Procedure Notes (Signed)
Procedure Name: LMA Insertion Date/Time: 03/06/2023 3:14 PM  Performed by: Maurene Capes, CRNAPre-anesthesia Checklist: Patient identified, Emergency Drugs available, Suction available and Patient being monitored Patient Re-evaluated:Patient Re-evaluated prior to induction Oxygen Delivery Method: Circle System Utilized Preoxygenation: Pre-oxygenation with 100% oxygen Induction Type: IV induction Ventilation: Mask ventilation without difficulty LMA: LMA inserted LMA Size: 4.0 Number of attempts: 1 Airway Equipment and Method: Bite block Placement Confirmation: positive ETCO2 Tube secured with: Tape Dental Injury: Teeth and Oropharynx as per pre-operative assessment

## 2023-03-06 NOTE — Interval H&P Note (Signed)
History and Physical Interval Note: The patient understands that she is here today for a left hip revision secondary to failure of her polyethylene liner from that hip.  There has been no acute or interval change in her medical status.  The risks and benefits of surgery have been discussed in detail and informed consent has been obtained.  The left operative hip has been marked.  03/06/2023 2:15 PM  Katie Gaines  has presented today for surgery, with the diagnosis of poly-liner wear, failure left hip.  The various methods of treatment have been discussed with the patient and family. After consideration of risks, benefits and other options for treatment, the patient has consented to  Procedure(s): LEFT HIP POLY-LINER AND HIP BALL REVISION (Left) as a surgical intervention.  The patient's history has been reviewed, patient examined, no change in status, stable for surgery.  I have reviewed the patient's chart and labs.  Questions were answered to the patient's satisfaction.     Kathryne Hitch

## 2023-03-06 NOTE — Anesthesia Preprocedure Evaluation (Signed)
Anesthesia Evaluation  Patient identified by MRN, date of birth, ID band Patient awake    Reviewed: Allergy & Precautions, NPO status , Patient's Chart, lab work & pertinent test results  Airway Mallampati: III  TM Distance: >3 FB Neck ROM: Full    Dental  (+) Poor Dentition, Chipped, Dental Advisory Given   Pulmonary former smoker Quit smoking 2022, 23 pack year history, no inhalers   Pulmonary exam normal breath sounds clear to auscultation       Cardiovascular hypertension, Pt. on medications Normal cardiovascular exam Rhythm:Regular Rate:Normal     Neuro/Psych  PSYCHIATRIC DISORDERS Anxiety      Neuromuscular disease negative neurological ROS     GI/Hepatic Neg liver ROS,GERD  Controlled and Medicated,,  Endo/Other  Hypothyroidism    Renal/GU negative Renal ROS  negative genitourinary   Musculoskeletal  (+) Arthritis , Osteoarthritis,  Per pt has had >10 back surgeries, most recent 2022- but successfully had THR last year under spinal   Abdominal   Peds  Hematology  (+) Blood dyscrasia, anemia Hb 11, plt 251 on labs at OSH 04/15/22   Anesthesia Other Findings   Reproductive/Obstetrics negative OB ROS                             Anesthesia Physical Anesthesia Plan  ASA: 3  Anesthesia Plan: General   Post-op Pain Management: Tylenol PO (pre-op)*   Induction: Intravenous  PONV Risk Score and Plan: 3 and Dexamethasone, Ondansetron and Treatment may vary due to age or medical condition  Airway Management Planned: Oral ETT and LMA  Additional Equipment: None  Intra-op Plan:   Post-operative Plan: Extubation in OR  Informed Consent: I have reviewed the patients History and Physical, chart, labs and discussed the procedure including the risks, benefits and alternatives for the proposed anesthesia with the patient or authorized representative who has indicated his/her understanding  and acceptance.     Dental advisory given  Plan Discussed with: CRNA and Anesthesiologist  Anesthesia Plan Comments:         Anesthesia Quick Evaluation

## 2023-03-07 ENCOUNTER — Encounter (HOSPITAL_COMMUNITY): Payer: Self-pay | Admitting: Orthopaedic Surgery

## 2023-03-07 ENCOUNTER — Encounter: Payer: Self-pay | Admitting: Orthopaedic Surgery

## 2023-03-07 MED ORDER — METHOCARBAMOL 500 MG PO TABS: 500.0000 mg | ORAL_TABLET | Freq: Four times a day (QID) | ORAL | 1 refills | Status: DC | PRN

## 2023-03-07 MED ORDER — ASPIRIN 81 MG PO CHEW: 81.0000 mg | CHEWABLE_TABLET | Freq: Two times a day (BID) | ORAL | 0 refills | Status: DC

## 2023-03-07 MED ORDER — OXYCODONE HCL 5 MG PO TABS: 5.0000 mg | ORAL_TABLET | ORAL | 0 refills | Status: DC | PRN

## 2023-03-07 NOTE — Discharge Instructions (Signed)
Dental Antibiotics:  In most cases prophylactic antibiotics for Dental procdeures after total joint surgery are not necessary.  Exceptions are as follows:  1. History of prior total joint infection  2. Severely immunocompromised (Organ Transplant, cancer chemotherapy, Rheumatoid biologic meds such as Humera)  3. Poorly controlled diabetes (A1C &gt; 8.0, blood glucose over 200)  If you have one of these conditions, contact your surgeon for an antibiotic prescription, prior to your dental procedure. INSTRUCTIONS AFTER JOINT REPLACEMENT   Remove items at home which could result in a fall. This includes throw rugs or furniture in walking pathways ICE to the affected joint every three hours while awake for 30 minutes at a time, for at least the first 3-5 days, and then as needed for pain and swelling.  Continue to use ice for pain and swelling. You may notice swelling that will progress down to the foot and ankle.  This is normal after surgery.  Elevate your leg when you are not up walking on it.   Continue to use the breathing machine you got in the hospital (incentive spirometer) which will help keep your temperature down.  It is common for your temperature to cycle up and down following surgery, especially at night when you are not up moving around and exerting yourself.  The breathing machine keeps your lungs expanded and your temperature down.   DIET:  As you were doing prior to hospitalization, we recommend a well-balanced diet.  DRESSING / WOUND CARE / SHOWERING  Keep the surgical dressing until follow up.  The dressing is water proof, so you can shower without any extra covering.  IF THE DRESSING FALLS OFF or the wound gets wet inside, change the dressing with sterile gauze.  Please use good hand washing techniques before changing the dressing.  Do not use any lotions or creams on the incision until instructed by your surgeon.    ACTIVITY  Increase activity slowly as tolerated, but  follow the weight bearing instructions below.   No driving for 6 weeks or until further direction given by your physician.  You cannot drive while taking narcotics.  No lifting or carrying greater than 10 lbs. until further directed by your surgeon. Avoid periods of inactivity such as sitting longer than an hour when not asleep. This helps prevent blood clots.  You may return to work once you are authorized by your doctor.     WEIGHT BEARING   Weight bearing as tolerated with assist device (walker, cane, etc) as directed, use it as long as suggested by your surgeon or therapist, typically at least 4-6 weeks.   EXERCISES  Results after joint replacement surgery are often greatly improved when you follow the exercise, range of motion and muscle strengthening exercises prescribed by your doctor. Safety measures are also important to protect the joint from further injury. Any time any of these exercises cause you to have increased pain or swelling, decrease what you are doing until you are comfortable again and then slowly increase them. If you have problems or questions, call your caregiver or physical therapist for advice.   Rehabilitation is important following a joint replacement. After just a few days of immobilization, the muscles of the leg can become weakened and shrink (atrophy).  These exercises are designed to build up the tone and strength of the thigh and leg muscles and to improve motion. Often times heat used for twenty to thirty minutes before working out will loosen up your tissues and help with   improving the range of motion but do not use heat for the first two weeks following surgery (sometimes heat can increase post-operative swelling).   These exercises can be done on a training (exercise) mat, on the floor, on a table or on a bed. Use whatever works the best and is most comfortable for you.    Use music or television while you are exercising so that the exercises are a pleasant  break in your day. This will make your life better with the exercises acting as a break in your routine that you can look forward to.   Perform all exercises about fifteen times, three times per day or as directed.  You should exercise both the operative leg and the other leg as well.  Exercises include:   Quad Sets - Tighten up the muscle on the front of the thigh (Quad) and hold for 5-10 seconds.   Straight Leg Raises - With your knee straight (if you were given a brace, keep it on), lift the leg to 60 degrees, hold for 3 seconds, and slowly lower the leg.  Perform this exercise against resistance later as your leg gets stronger.  Leg Slides: Lying on your back, slowly slide your foot toward your buttocks, bending your knee up off the floor (only go as far as is comfortable). Then slowly slide your foot back down until your leg is flat on the floor again.  Angel Wings: Lying on your back spread your legs to the side as far apart as you can without causing discomfort.  Hamstring Strength:  Lying on your back, push your heel against the floor with your leg straight by tightening up the muscles of your buttocks.  Repeat, but this time bend your knee to a comfortable angle, and push your heel against the floor.  You may put a pillow under the heel to make it more comfortable if necessary.   A rehabilitation program following joint replacement surgery can speed recovery and prevent re-injury in the future due to weakened muscles. Contact your doctor or a physical therapist for more information on knee rehabilitation.    CONSTIPATION  Constipation is defined medically as fewer than three stools per week and severe constipation as less than one stool per week.  Even if you have a regular bowel pattern at home, your normal regimen is likely to be disrupted due to multiple reasons following surgery.  Combination of anesthesia, postoperative narcotics, change in appetite and fluid intake all can affect your  bowels.   YOU MUST use at least one of the following options; they are listed in order of increasing strength to get the job done.  They are all available over the counter, and you may need to use some, POSSIBLY even all of these options:    Drink plenty of fluids (prune juice may be helpful) and high fiber foods Colace 100 mg by mouth twice a day  Senokot for constipation as directed and as needed Dulcolax (bisacodyl), take with full glass of water  Miralax (polyethylene glycol) once or twice a day as needed.  If you have tried all these things and are unable to have a bowel movement in the first 3-4 days after surgery call either your surgeon or your primary doctor.    If you experience loose stools or diarrhea, hold the medications until you stool forms back up.  If your symptoms do not get better within 1 week or if they get worse, check with your doctor.    If you experience "the worst abdominal pain ever" or develop nausea or vomiting, please contact the office immediately for further recommendations for treatment.   ITCHING:  If you experience itching with your medications, try taking only a single pain pill, or even half a pain pill at a time.  You can also use Benadryl over the counter for itching or also to help with sleep.   TED HOSE STOCKINGS:  Use stockings on both legs until for at least 2 weeks or as directed by physician office. They may be removed at night for sleeping.  MEDICATIONS:  See your medication summary on the "After Visit Summary" that nursing will review with you.  You may have some home medications which will be placed on hold until you complete the course of blood thinner medication.  It is important for you to complete the blood thinner medication as prescribed.  PRECAUTIONS:  If you experience chest pain or shortness of breath - call 911 immediately for transfer to the hospital emergency department.   If you develop a fever greater that 101 F, purulent drainage  from wound, increased redness or drainage from wound, foul odor from the wound/dressing, or calf pain - CONTACT YOUR SURGEON.                                                   FOLLOW-UP APPOINTMENTS:  If you do not already have a post-op appointment, please call the office for an appointment to be seen by your surgeon.  Guidelines for how soon to be seen are listed in your "After Visit Summary", but are typically between 1-4 weeks after surgery.  OTHER INSTRUCTIONS:   Knee Replacement:  Do not place pillow under knee, focus on keeping the knee straight while resting. CPM instructions: 0-90 degrees, 2 hours in the morning, 2 hours in the afternoon, and 2 hours in the evening. Place foam block, curve side up under heel at all times except when in CPM or when walking.  DO NOT modify, tear, cut, or change the foam block in any way.  POST-OPERATIVE OPIOID TAPER INSTRUCTIONS: It is important to wean off of your opioid medication as soon as possible. If you do not need pain medication after your surgery it is ok to stop day one. Opioids include: Codeine, Hydrocodone(Norco, Vicodin), Oxycodone(Percocet, oxycontin) and hydromorphone amongst others.  Long term and even short term use of opiods can cause: Increased pain response Dependence Constipation Depression Respiratory depression And more.  Withdrawal symptoms can include Flu like symptoms Nausea, vomiting And more Techniques to manage these symptoms Hydrate well Eat regular healthy meals Stay active Use relaxation techniques(deep breathing, meditating, yoga) Do Not substitute Alcohol to help with tapering If you have been on opioids for less than two weeks and do not have pain than it is ok to stop all together.  Plan to wean off of opioids This plan should start within one week post op of your joint replacement. Maintain the same interval or time between taking each dose and first decrease the dose.  Cut the total daily intake of  opioids by one tablet each day Next start to increase the time between doses. The last dose that should be eliminated is the evening dose.   MAKE SURE YOU:  Understand these instructions.  Get help right away if you are not doing   well or get worse.    Thank you for letting us be a part of your medical care team.  It is a privilege we respect greatly.  We hope these instructions will help you stay on track for a fast and full recovery!       

## 2023-03-07 NOTE — Plan of Care (Signed)
  Problem: Activity: Goal: Ability to avoid complications of mobility impairment will improve Outcome: Progressing   Problem: Activity: Goal: Ability to tolerate increased activity will improve Outcome: Progressing   Problem: Pain Management: Goal: Pain level will decrease with appropriate interventions Outcome: Progressing   Problem: Skin Integrity: Goal: Will show signs of wound healing Outcome: Progressing   Problem: Clinical Measurements: Goal: Will remain free from infection Outcome: Progressing

## 2023-03-07 NOTE — Progress Notes (Signed)
Subjective: 1 Day Post-Op Procedure(s) (LRB): LEFT HIP POLY-LINER AND HIP BALL REVISION (Left) Patient reports pain as moderate.  Wanting to discharge to home.   Objective: Vital signs in last 24 hours: Temp:  [97.7 F (36.5 C)-98.2 F (36.8 C)] 98.1 F (36.7 C) (07/30 0914) Pulse Rate:  [55-101] 55 (07/30 0914) Resp:  [10-19] 16 (07/30 0914) BP: (94-146)/(60-95) 94/60 (07/30 0914) SpO2:  [94 %-100 %] 94 % (07/30 0914)  Intake/Output from previous day: 07/29 0701 - 07/30 0700 In: 2060.3 [P.O.:240; I.V.:1470.3; IV Piggyback:350] Out: 200 [Blood:200] Intake/Output this shift: Total I/O In: 120 [P.O.:120] Out: -   Recent Labs    03/07/23 0337  HGB 11.9*   Recent Labs    03/07/23 0337  WBC 6.8  RBC 3.97  HCT 38.1  PLT 242   Recent Labs    03/07/23 0337  NA 141  K 4.2  CL 109  CO2 24  BUN 16  CREATININE 0.69  GLUCOSE 144*  CALCIUM 8.7*   No results for input(s): "LABPT", "INR" in the last 72 hours.  Intact pulses distally Dorsiflexion/Plantar flexion intact Incision: dressing C/D/I Compartment soft   Assessment/Plan: 1 Day Post-Op Procedure(s) (LRB): LEFT HIP POLY-LINER AND HIP BALL REVISION (Left) Discharge home with home health if remains stable and safe from PT standpoint.       Katie Gaines 03/07/2023, 12:43 PM

## 2023-03-07 NOTE — Progress Notes (Signed)
Physical Therapy Treatment Patient Details Name: Katie Gaines MRN: [id number] DOB: Jul 18, 1956 Today's Date: 03/07/2023   History of Present Illness 67 yo female S/P revision  LTHA on 7.29/24. PMH: 10 spinal surgeries, LTHA, RTKA, chronic pain    PT Comments  The patient found ambulating in hall while PT was bringing  stairs to room. PT also stressed to patient to remain use of RW as patient stepped away from RW .Patient has met PT goals for DC.    If plan is discharge home, recommend the following: A little help with bathing/dressing/bathroom;Assistance with cooking/housework;Assist for transportation;Help with stairs or ramp for entrance   Can travel by private vehicle        Equipment Recommendations  None recommended by PT    Recommendations for Other Services       Precautions / Restrictions Precautions Precautions: Fall Restrictions Weight Bearing Restrictions: No     Mobility  Bed Mobility Overal bed mobility: Modified Independent                  Transfers Overall transfer level: Needs assistance Equipment used: Rolling walker (2 wheels) Transfers: Sit to/from Stand Sit to Stand: Supervision           General transfer comment: cues for safety    Ambulation/Gait Ambulation/Gait assistance: Supervision Gait Distance (Feet): 50 Feet Assistive device: Rolling walker (2 wheels) Gait Pattern/deviations: Step-to pattern, Step-through pattern Gait velocity: decr     General Gait Details: cues for safety   Stairs Stairs: Yes Stairs assistance: Min guard Stair Management: One rail Right, Forwards Number of Stairs: 2 (and 3) General stair comments: cues for sequence and safety   Wheelchair Mobility     Tilt Bed    Modified Rankin (Stroke Patients Only)       Balance Overall balance assessment: Mild deficits observed, not formally tested                                          Cognition Arousal/Alertness:  Awake/alert Behavior During Therapy: WFL for tasks assessed/performed Overall Cognitive Status: Within Functional Limits for tasks assessed                                 General Comments: Patient noted ambulating in hall without therapist, cues for safety. Cues for safety as patient walked away from RW  before sitting down onto bed.        Exercises      General Comments        Pertinent Vitals/Pain Pain Assessment Pain Assessment: 0-10 Pain Score: 6  Pain Location: left hip Pain Descriptors / Indicators: Grimacing, Guarding Pain Intervention(s): RN gave pain meds during session, Ice applied    Home Living Family/patient expects to be discharged to:: Private residence Living Arrangements: Spouse/significant other Available Help at Discharge: Family;Available 24 hours/day Type of Home: House Home Access: Stairs to enter Entrance Stairs-Rails: Dealer of Steps: 2   Home Layout: One level Home Equipment: Adaptive equipment;Hand held shower head;Grab bars - tub/shower;Cane - single point;Shower seat - built Charity fundraiser (2 wheels);BSC/3in1      Prior Function            PT Goals (current goals can now be found in the care plan section) Acute Rehab PT Goals Patient Stated Goal: go home PT  Goal Formulation: With patient Time For Goal Achievement: 03/15/23 Potential to Achieve Goals: Good Progress towards PT goals: Progressing toward goals    Frequency    7X/week      PT Plan Current plan remains appropriate    Co-evaluation              AM-PAC PT "6 Clicks" Mobility   Outcome Measure  Help needed turning from your back to your side while in a flat bed without using bedrails?: None Help needed moving from lying on your back to sitting on the side of a flat bed without using bedrails?: None Help needed moving to and from a bed to a chair (including a wheelchair)?: None Help needed standing up from a chair  using your arms (e.g., wheelchair or bedside chair)?: None Help needed to walk in hospital room?: A Little Help needed climbing 3-5 steps with a railing? : A Little 6 Click Score: 22    End of Session   Activity Tolerance: Patient tolerated treatment well Patient left: in bed;with call bell/phone within reach;with bed alarm set Nurse Communication: Mobility status PT Visit Diagnosis: Unsteadiness on feet (R26.81);Difficulty in walking, not elsewhere classified (R26.2)     Time: 1150-1200 PT Time Calculation (min) (ACUTE ONLY): 10 min  Charges:    $Gait Training: 8-22 mins PT General Charges $$ ACUTE PT VISIT: 1 Visit                     Blanchard Kelch PT Acute Rehabilitation Services Office [phone] Weekend [phone]    Rada Hay 03/07/2023, 2:07 PM

## 2023-03-07 NOTE — Progress Notes (Signed)
Subjective: 1 Day Post-Op Procedure(s) (LRB): LEFT HIP POLY-LINER AND HIP BALL REVISION (Left) Patient reports pain as moderate.    Objective: Vital signs in last 24 hours: Temp:  [97.7 F (36.5 C)-98.6 F (37 C)] 97.7 F (36.5 C) (07/30 0629) Pulse Rate:  [66-101] 66 (07/30 0629) Resp:  [10-19] 16 (07/30 0629) BP: (115-146)/(71-95) 115/71 (07/30 0629) SpO2:  [94 %-100 %] 99 % (07/30 0629) Weight:  [64 kg] 64 kg (07/29 1238)  Intake/Output from previous day: 07/29 0701 - 07/30 0700 In: 2060.3 [P.O.:240; I.V.:1470.3; IV Piggyback:350] Out: 200 [Blood:200] Intake/Output this shift: No intake/output data recorded.  Recent Labs    03/07/23 0337  HGB 11.9*   Recent Labs    03/07/23 0337  WBC 6.8  RBC 3.97  HCT 38.1  PLT 242   Recent Labs    03/07/23 0337  NA 141  K 4.2  CL 109  CO2 24  BUN 16  CREATININE 0.69  GLUCOSE 144*  CALCIUM 8.7*   No results for input(s): "LABPT", "INR" in the last 72 hours.  Sensation intact distally Intact pulses distally Dorsiflexion/Plantar flexion intact Incision: dressing C/D/I   Assessment/Plan: 1 Day Post-Op Procedure(s) (LRB): LEFT HIP POLY-LINER AND HIP BALL REVISION (Left) Up with therapy      Kathryne Hitch 03/07/2023, 8:51 AM

## 2023-03-07 NOTE — Discharge Summary (Signed)
Patient ID: Katie Gaines MRN: [id number] DOB/AGE: 1955-09-25 67 y.o.  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 female who was admitted 03/06/2023 for operative treatment of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). Patient has severe unremitting pain that affects sleep, daily activities, and work/hobbies. After pre-op clearance the patient was taken to the operating room on 03/06/2023 and underwent  Procedure(s): LEFT HIP POLY-LINER AND HIP BALL REVISION.    Patient was given perioperative antibiotics:  Anti-infectives (From admission, onward)    Start     Dose/Rate Route Frequency Ordered Stop   03/06/23 2100  ceFAZolin (ANCEF) IVPB 1 g/50 mL premix        1 g 100 mL/hr over 30 Minutes Intravenous Every 6 hours 03/06/23 1851 03/07/23 0400   03/06/23 1200  ceFAZolin (ANCEF) IVPB 2g/100 mL premix        2 g 200 mL/hr over 30 Minutes Intravenous On call to O.R. 03/06/23 1159 03/06/23 1505        Patient was given sequential compression devices, early ambulation, and chemoprophylaxis to prevent DVT.  Patient benefited maximally from hospital stay and there were no complications.    Recent vital signs: Patient Vitals for the past 24 hrs:  BP Temp Temp src Pulse Resp SpO2  03/07/23 0914 94/60 98.1 F (36.7 C) Oral (!) 55 16 94 %  03/07/23 0629  115/71 97.7 F (36.5 C) Oral 66 16 99 %  03/07/23 0100 132/76 98 F (36.7 C) Oral 72 17 95 %  03/06/23 2123 136/77 97.9 F (36.6 C) Oral 77 18 97 %  03/06/23 1853 124/75 97.8 F (36.6 C) Oral 70 14 97 %  03/06/23 1830 129/78 -- -- 73 12 94 %  03/06/23 1815 127/73 98 F (36.7 C) -- 71 14 100 %  03/06/23 1800 135/83 -- -- 73 12 98 %  03/06/23 1745 130/80 -- -- 84 19 98 %  03/06/23 1730 (!) 146/83 -- -- 82 12 97 %  03/06/23 1715 (!) 135/95 -- -- 88 10 97 %  03/06/23 1700 (!) 141/94 98.2 F (36.8 C) -- (!) 101 10 95 %     Recent laboratory studies:  Recent Labs    03/07/23 0337  WBC 6.8  HGB 11.9*  HCT 38.1  PLT 242  NA 141  K 4.2  CL 109  CO2 24  BUN 16  CREATININE 0.69  GLUCOSE 144*  CALCIUM 8.7*     Discharge Medications:   Allergies as of 03/07/2023   No Known Allergies      Medication List     STOP taking these medications    aspirin-acetaminophen-caffeine 250-250-65 MG tablet Commonly known as: EXCEDRIN MIGRAINE       TAKE these medications  amitriptyline 25 MG tablet Commonly known as: ELAVIL Take 25 mg by mouth at bedtime.   amLODipine 5 MG tablet Commonly known as: NORVASC Take 5 mg by mouth daily.   aspirin 81 MG chewable tablet Chew 1 tablet (81 mg total) by mouth 2 (two) times daily.   atorvastatin 10 MG tablet Commonly known as: LIPITOR Take 10 mg by mouth daily.   ergocalciferol 1.25 MG (50000 UT) capsule Commonly known as: VITAMIN D2 Take 50,000 Units by mouth every Sunday.   gabapentin 800 MG tablet Commonly known as: NEURONTIN Take 800 mg by mouth 4 (four) times daily.   lamoTRIgine 150 MG tablet Commonly known as: LAMICTAL Take 1 tablet (150 mg total) by mouth 2 (two) times daily. Please call us at [phone] to make follow up visit for further refills.   methocarbamol 500 MG tablet Commonly known as: ROBAXIN Take 1 tablet (500 mg total) by mouth every 6 (six) hours as needed for muscle spasms.   morphine 30 MG  tablet Commonly known as: MSIR Take 30 mg by mouth in the morning and at bedtime.   nitroGLYCERIN 0.4 MG SL tablet Commonly known as: NITROSTAT Place 0.4 mg under the tongue every 5 (five) minutes as needed (esophageal spasms).   omeprazole 40 MG capsule Commonly known as: PRILOSEC Take 40 mg by mouth daily.   oxyCODONE 5 MG immediate release tablet Commonly known as: Oxy IR/ROXICODONE Take 1-2 tablets (5-10 mg total) by mouth every 4 (four) hours as needed for moderate pain (pain score 4-6).   Synthroid 125 MCG tablet Generic drug: levothyroxine Take 125 mcg by mouth daily before breakfast.               Durable Medical Equipment  (From admission, onward)           Start     Ordered   03/06/23 1852  DME 3 n 1  Once        03/06/23 1851   03/06/23 1852  DME Walker rolling  Once       Question Answer Comment  Walker: With 5 Inch Wheels   Patient needs a walker to treat with the following condition Status post left hip replacement      07 /29/24 1851            Diagnostic Studies: DG Pelvis Portable  Result Date: 03/06/2023 CLINICAL DATA:  Status post revision of total left hip arthroplasty. EXAM: PORTABLE PELVIS 1-2 VIEWS COMPARISON:  Left hip radiographs and CT left hip 02/13/2023 FINDINGS: There is no improvement in the total left hip arthroplasty hardware alignment. Resolution of the prior superiorly eccentric positioning of the femoral head prosthesis. The femoral head prosthesis is now normally centrally aligned within the left acetabulum. No perihardware lucency is seen to indicate hardware failure or loosening. Mild superior pubic symphysis joint space narrowing. Expected postoperative changes of subcutaneous air about the left hip. Lateral left hip surgical skin staples. IMPRESSION: Status post revision of total left hip arthroplasty. Now normal left acetabular cup-femoral head prosthesis alignment. Electronically Signed   By: Neita Garnet M.D.   On:  03/06/2023 17:52   DG HIP UNILAT WITH PELVIS 1V LEFT  Result Date: 03/06/2023 CLINICAL DATA:  Left anterior hip by liner and tip are revision surgery. Intraoperative fluoroscopy. EXAM: DG HIP (WITH OR WITHOUT PELVIS) 1V*L* COMPARISON:  Left hip radiographs 02/13/2023, CT left hip 02/13/2023 FINDINGS: Images were performed intraoperatively without the presence of a radiologist. On the initial views there is again  abnormal superior eccentric location of the total left hip arthroplasty femoral head component. Subsequent radiographs demonstrates resolution of this asymmetry, with the femoral head prosthesis now centrally located within the left acetabular prosthesis. Popcorn calcifications from a calcified fibroid again overlie the left hemipelvis. Total fluoroscopy images: 5 Total fluoroscopy time: 5 seconds Total dose: Radiation Exposure Index (as provided by the fluoroscopic device): 0.64 mGy air Kerma Please see intraoperative findings for further detail. IMPRESSION: Intraoperative fluoroscopy for left total hip arthroplasty revision surgery. Electronically Signed   By: Neita Garnet M.D.   On: 03/06/2023 17:50   DG C-Arm 1-60 Min-No Report  Result Date: 03/06/2023 Fluoroscopy was utilized by the requesting physician.  No radiographic interpretation.   DG C-Arm 1-60 Min-No Report  Result Date: 03/06/2023 Fluoroscopy was utilized by the requesting physician.  No radiographic interpretation.   XR HIP UNILAT W OR W/O PELVIS 2-3 VIEWS LEFT  Result Date: 02/13/2023 AP pelvis and lateral view of the left hip: Femoral component left total hip well-seated no signs of loosening or hardware failure.  Left femoral head is high riding cannot rule out dislocation.  Acetabular component otherwise well-seated.  No acute fractures or acute findings otherwise.   CT HIP LEFT WO CONTRAST  Result Date: 02/13/2023 CLINICAL DATA:  Hip trauma, fracture suspected. EXAM: CT OF THE LEFT HIP WITHOUT CONTRAST TECHNIQUE:  Multidetector CT imaging of the left hip was performed according to the standard protocol. Multiplanar CT image reconstructions were also generated. RADIATION DOSE REDUCTION: This exam was performed according to the departmental dose-optimization program which includes automated exposure control, adjustment of the mA and/or kV according to patient size and/or use of iterative reconstruction technique. COMPARISON:  None Available. FINDINGS: Bones/Joint/Cartilage Status post left hip arthroplasty with eccentric location of the head of the femoral component suggesting polyethylene wear. No evidence of acute fracture or perihardware loosening. Multilevel degenerate disc disease of the lumbar spine with interbody fusion at L4-L5 and L5-S1. Ligaments Suboptimally assessed by CT. Muscles and Tendons Muscles are normal in bulk.  No evidence of tendon tear. Soft tissues Prominent atherosclerotic calcification of abdominal aorta and branch vessels. Degenerated fibroid in the uterus. IMPRESSION: 1. Status post left hip arthroplasty with eccentric location of the head of the femoral component suggesting polyethylene wear. No evidence of acute fracture or perihardware loosening. 2. Muscles are normal in bulk. No evidence of tendon tear. 3. Multilevel degenerate disc disease of the lumbar spine with interbody fusion at L4-L5 and L5-S1. 4. Aortic atherosclerosis. Electronically Signed   By: Larose Hires D.O.   On: 02/13/2023 13:01    Disposition: Discharge disposition: 06-Home-Health Care Svc          Follow-up Information     Kathryne Hitch, MD Follow up in 2 week(s).   Specialty: Orthopedic Surgery Contact information: [address] [place] [address] [phone]                  Signed: Richardean Canal 03/07/2023, 12:42 PM

## 2023-03-07 NOTE — TOC Transition Note (Signed)
Transition of Care [facility]) - CM/SW Discharge Note  Patient Details  Name: Katie Gaines MRN: [id number] Date of Birth: 11-Sep-1955  [facility]) CM/SW Contact:  Ewing Schlein, LCSW Phone Number: 03/07/2023, 10:36 AM  Clinical Narrative: Patient is expected to discharge home after working with PT. Patient will go home with HHPT, which was prearranged with [facility]. Patient was going to private pay for a replacement rolling walker, but family was able to find one at home. TOC signing off.  Final next level of care: Home w Home Health Services Barriers to Discharge: No Barriers Identified  Patient Goals and CMS Choice Choice offered to / list presented to : NA  Discharge Plan and Services Additional resources added to the After Visit Summary for          DME Arranged: N/A DME Agency: NA HH Arranged: PT HH Agency: [facility] Representative spoke with at [facility] Agency: Prearranged in orthopedist's office  Social Determinants of Health (SDOH) Interventions SDOH Screenings   Food Insecurity: No Food Insecurity (03/06/2023)  Housing: Low Risk  (03/06/2023)  Transportation Needs: No Transportation Needs (03/06/2023)  Utilities: Not At Risk (03/06/2023)  Financial Resource Strain: Low Risk  (08/29/2022)   Received from [facility], [facility]  Physical Activity: Sufficiently Active (08/29/2022)   Received from [facility], [facility]  Social Connections: Socially Integrated (08/29/2022)   Received from [facility], [facility]  Stress: No Stress Concern Present (08/29/2022)   Received from [facility], [facility]  Tobacco Use: Medium Risk (03/06/2023)   Readmission Risk Interventions     No data to display

## 2023-03-07 NOTE — Evaluation (Signed)
Physical Therapy Evaluation Patient Details Name: Katie Gaines MRN: [id number] DOB: [birth year] Today's Date: 03/07/2023  History of Present Illness  67 yo female S/P revision  LTHA on 7.29/24. PMH: 10 spinal surgeries, LTHA, RTKA, chronic pain  Clinical Impression  Pt admitted with above diagnosis.  Pt currently with functional limitations due to the deficits listed below (see PT Problem List). Pt will benefit from acute skilled PT to increase their independence and safety with mobility to allow discharge.     The patient eager to mobilize and manage LLE, patient ambulated with RW. Will practice steps next vist, soon.      If plan is discharge home, recommend the following: A little help with bathing/dressing/bathroom;Assistance with cooking/housework;Assist for transportation;Help with stairs or ramp for entrance   Can travel by private vehicle        Equipment Recommendations None recommended by PT  Recommendations for Other Services       Functional Status Assessment Patient has had a recent decline in their functional status and demonstrates the ability to make significant improvements in function in a reasonable and predictable amount of time.     Precautions / Restrictions Precautions Precautions: Fall Restrictions Weight Bearing Restrictions: No      Mobility  Bed Mobility Overal bed mobility: Modified Independent                  Transfers Overall transfer level: Needs assistance Equipment used: Rolling walker (2 wheels) Transfers: Sit to/from Stand Sit to Stand: Supervision           General transfer comment: cues fioor safety    Ambulation/Gait Ambulation/Gait assistance: Supervision Gait Distance (Feet): 20 Feet (then 100') Assistive device: Rolling walker (2 wheels) Gait Pattern/deviations: Step-to pattern, Step-through pattern Gait velocity: decr     General Gait Details: cues for safety  Stairs            Wheelchair  Mobility     Tilt Bed    Modified Rankin (Stroke Patients Only)       Balance Overall balance assessment: Mild deficits observed, not formally tested                                           Pertinent Vitals/Pain Pain Assessment Pain Assessment: 0-10 Pain Score: 6  Pain Location: left hip Pain Descriptors / Indicators: Grimacing, Guarding Pain Intervention(s): Premedicated before session, Monitored during session, Repositioned, Ice applied    Home Living Family/patient expects to be discharged to:: Private residence Living Arrangements: Spouse/significant other Available Help at Discharge: Family;Available 24 hours/day Type of Home: House Home Access: Stairs to enter Entrance Stairs-Rails: Dealer of Steps: 2   Home Layout: One level Home Equipment: Adaptive equipment;Hand held shower head;Grab bars - tub/shower;Cane - single point;Shower seat - built Charity fundraiser (2 wheels);BSC/3in1      Prior Function Prior Level of Function : Independent/Modified Independent             Mobility Comments: no AD, active indep at baseline ADLs Comments: indep     Hand Dominance   Dominant Hand: Right    Extremity/Trunk Assessment   Upper Extremity Assessment Upper Extremity Assessment: Overall WFL for tasks assessed    Lower Extremity Assessment Lower Extremity Assessment: LLE deficits/detail LLE Deficits / Details: able to advance the left leg    Cervical / Trunk Assessment Cervical / Trunk Assessment:  Normal  Communication   Communication: No difficulties  Cognition Arousal/Alertness: Awake/alert Behavior During Therapy: WFL for tasks assessed/performed Overall Cognitive Status: Within Functional Limits for tasks assessed                                          General Comments      Exercises     Assessment/Plan    PT Assessment Patient needs continued PT services  PT Problem List  Pain;Decreased range of motion;Decreased safety awareness;Decreased activity tolerance;Decreased mobility       PT Treatment Interventions DME instruction;Stair training;Therapeutic activities;Gait training;Functional mobility training;Patient/family education    PT Goals (Current goals can be found in the Care Plan section)  Acute Rehab PT Goals Patient Stated Goal: go home PT Goal Formulation: With patient Time For Goal Achievement: 03/15/23 Potential to Achieve Goals: Good    Frequency 7X/week     Co-evaluation               AM-PAC PT "6 Clicks" Mobility  Outcome Measure Help needed turning from your back to your side while in a flat bed without using bedrails?: None Help needed moving from lying on your back to sitting on the side of a flat bed without using bedrails?: None Help needed moving to and from a bed to a chair (including a wheelchair)?: A Little Help needed standing up from a chair using your arms (e.g., wheelchair or bedside chair)?: A Little Help needed to walk in hospital room?: A Little Help needed climbing 3-5 steps with a railing? : A Lot 6 Click Score: 19    End of Session   Activity Tolerance: Patient tolerated treatment well Patient left: in bed (seated on bed) Nurse Communication: Mobility status PT Visit Diagnosis: Unsteadiness on feet (R26.81);Difficulty in walking, not elsewhere classified (R26.2)    Time: 1610-9604 PT Time Calculation (min) (ACUTE ONLY): 16 min   Charges:   PT Evaluation $PT Eval Low Complexity: 1 Low   PT General Charges $$ ACUTE PT VISIT: 1 Visit         Blanchard Kelch PT Acute Rehabilitation Services Office [phone] Weekend pager-[phone]   Rada Hay 03/07/2023, 2:01 PM

## 2023-03-09 ENCOUNTER — Encounter: Payer: Medicare Other | Admitting: Orthopaedic Surgery

## 2023-03-10 ENCOUNTER — Telehealth: Payer: Self-pay

## 2023-03-10 NOTE — Telephone Encounter (Signed)
Called and advised.

## 2023-03-10 NOTE — Telephone Encounter (Signed)
Pt is s/p al eft poly liner and ball revision left hip on 03/06/2023. [facility] called [facility] Nest [phone]) to advise that pt has refused in home PT and states that CB was aware of this. HHPT wanted to make sure that this information was correct. Can call with questions.

## 2023-03-21 ENCOUNTER — Encounter: Payer: Self-pay | Admitting: Orthopaedic Surgery

## 2023-03-21 ENCOUNTER — Other Ambulatory Visit (INDEPENDENT_AMBULATORY_CARE_PROVIDER_SITE_OTHER): Payer: Medicare Other

## 2023-03-21 ENCOUNTER — Ambulatory Visit: Payer: Medicare Other | Admitting: Orthopaedic Surgery

## 2023-03-21 DIAGNOSIS — M79671 Pain in right foot: Secondary | ICD-10-CM | POA: Diagnosis not present

## 2023-03-21 DIAGNOSIS — Z96642 Presence of left artificial hip joint: Secondary | ICD-10-CM

## 2023-03-21 NOTE — Progress Notes (Signed)
The patient is here for first postoperative visit status post a left hip revision due to failure of the polyethylene liner.  She is already walking without assistive device and says she is doing great.  She did need [place] to check out her left right foot.  She has been having some pain since she fell soon after we saw her in the office before her revision surgery.  Her left hip incision looks good.  The sutures have been removed and Steri-Strips applied.  I did find a very large seroma in with true multiple 60 cc syringes of fluid from the hip.  This decompressed a completely but likely she will have a recurrence.  Examination of her right foot and ankle shows some swelling but no instability on exam.  3 views of the right foot show no obvious fracture.  I can see the ankle as well with no obvious fracture.  Will try an ASO for her ankle and see if this will help her.  I would like to see her back in just 1 week but no x-rays are needed.  This is likely for repeat aspiration of her seroma.  She agrees with the treatment plan.

## 2023-03-29 ENCOUNTER — Encounter: Payer: Self-pay | Admitting: Orthopaedic Surgery

## 2023-03-29 ENCOUNTER — Ambulatory Visit (INDEPENDENT_AMBULATORY_CARE_PROVIDER_SITE_OTHER): Payer: Medicare Other | Admitting: Orthopaedic Surgery

## 2023-03-29 DIAGNOSIS — Z96642 Presence of left artificial hip joint: Secondary | ICD-10-CM

## 2023-03-29 NOTE — Progress Notes (Signed)
The patient is 3 and half weeks out from left hip surgery where we had the exchange of failed polyethylene liner.  I wanted to see her back this week due to a large seroma we drained off of her hip a week ago.  Her x-rays last week look good.  She is still doing well walking without assistive device.  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  I was able to aspirate over 100 cc of fluid from the area which gave her good relief.  There is no evidence of infection.  I would like to see her back in 1 week again for an incision and wound check.

## 2023-04-05 ENCOUNTER — Other Ambulatory Visit (INDEPENDENT_AMBULATORY_CARE_PROVIDER_SITE_OTHER): Payer: Medicare Other

## 2023-04-05 ENCOUNTER — Encounter: Payer: Self-pay | Admitting: Orthopaedic Surgery

## 2023-04-05 ENCOUNTER — Ambulatory Visit (INDEPENDENT_AMBULATORY_CARE_PROVIDER_SITE_OTHER): Payer: Medicare Other | Admitting: Orthopaedic Surgery

## 2023-04-05 DIAGNOSIS — M79641 Pain in right hand: Secondary | ICD-10-CM | POA: Diagnosis not present

## 2023-04-05 MED ORDER — DOXYCYCLINE HYCLATE 100 MG PO TABS: 100.0000 mg | ORAL_TABLET | Freq: Two times a day (BID) | ORAL | 0 refills | Status: DC

## 2023-04-05 NOTE — Progress Notes (Signed)
The patient continues to follow-up status post a left hip polyliner exchange for failed polyliner.  Her surgery was almost 1 month ago.  We keep seeing her back on a weekly basis because of her reaccumulation of the seroma.  She has had several falls since surgery.  She is walking without an assistive device and these have been just accidental falls with one being yesterday in her garden.  She is complaining of pain of her right hand and she points to the basilar thumb joint as a source of that pain.  She still has a significant seroma with her left hip and I had to aspirate a large amount of fluid again from that hip and then placed a compressive dressing around her left hip.  There is no evidence of infection but I would still like at this point to put her on doxycycline for the next 2 weeks.  I did send this into her pharmacy.  Examination of her right hand does show pain at the basilar thumb joint.  There is no bruising and there is no obvious fracture or malalignment.  She can make a fist but it is painful.  Of note there is also an abrasion across her left knee from a recent fall.  I did talk to her in length in detail about her falling and I want her to be careful.  We will see her back next week for a repeat aspiration of that left hip.  No x-rays are needed at the left hip.  I want her to try to be careful and not fall.  Again I sent in some doxycycline as well.  If her right hand becomes an issue I will send her to Dr. Fara Boros given her [facility] arthritis.

## 2023-04-12 ENCOUNTER — Encounter: Payer: Self-pay | Admitting: Orthopaedic Surgery

## 2023-04-12 ENCOUNTER — Ambulatory Visit (INDEPENDENT_AMBULATORY_CARE_PROVIDER_SITE_OTHER): Payer: Medicare Other | Admitting: Orthopaedic Surgery

## 2023-04-12 DIAGNOSIS — Z96642 Presence of left artificial hip joint: Secondary | ICD-10-CM

## 2023-04-12 NOTE — Progress Notes (Signed)
The patient is seen in follow-up today.  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  We seen her back in 8-week to drain a recurrent seroma and hematoma from her hip.  On exam she still has some swelling there and some slight redness.  I have her on doxycycline.  I cannot really aspirate any fluid from it today.  At this point I would like to see her back in 2 weeks to see how she is doing overall.  If things worsen she needs to let us know.  No x-rays are needed at her next visit.

## 2023-04-27 ENCOUNTER — Ambulatory Visit (INDEPENDENT_AMBULATORY_CARE_PROVIDER_SITE_OTHER): Payer: Medicare Other | Admitting: Orthopaedic Surgery

## 2023-04-27 ENCOUNTER — Encounter: Payer: Self-pay | Admitting: Orthopaedic Surgery

## 2023-04-27 DIAGNOSIS — Z96642 Presence of left artificial hip joint: Secondary | ICD-10-CM

## 2023-04-27 NOTE — Progress Notes (Signed)
The patient is now around 6 weeks status post a left hip revision where we revised a failed polyethylene liner and hip ball.  We have been seeing on a regular basis due to recurrent seroma.  She is doing well overall.  On exam today there is no significant fluid collection that we need to drain.  There is no redness to either and overall she looks good.  She is walking without an assistive device.  From my standpoint she will go slow.  I would like to see her back now in 3 months.  At that visit we will have a standing low AP pelvis and a lateral of her left hip.

## 2023-07-24 ENCOUNTER — Ambulatory Visit: Payer: Medicare Other | Admitting: Orthopaedic Surgery

## 2023-08-21 ENCOUNTER — Ambulatory Visit: Payer: Medicare Other | Admitting: Orthopaedic Surgery

## 2023-08-30 ENCOUNTER — Ambulatory Visit: Payer: Medicare Other | Admitting: Orthopaedic Surgery

## 2023-09-13 ENCOUNTER — Ambulatory Visit: Payer: Medicare Other | Admitting: Orthopaedic Surgery

## 2023-10-05 ENCOUNTER — Ambulatory Visit: Payer: Medicare Other | Admitting: Orthopaedic Surgery

## 2023-10-05 ENCOUNTER — Other Ambulatory Visit (INDEPENDENT_AMBULATORY_CARE_PROVIDER_SITE_OTHER): Payer: Self-pay

## 2023-10-05 ENCOUNTER — Encounter: Payer: Self-pay | Admitting: Orthopaedic Surgery

## 2023-10-05 DIAGNOSIS — Z96642 Presence of left artificial hip joint: Secondary | ICD-10-CM

## 2023-10-05 NOTE — Progress Notes (Signed)
 The patient is now just over 7 months status post a revision left hip arthroplasty.  She had significant failure of the poly liner of her previous total hip that had not been in her long.  She has been doing well overall.  She denies any pain.  She denies any squeaking sensations like she had when the polyliner failed.  She is doing better otherwise with other than some swelling still.  Her left hip incision looks great.  There is no redness.  There is some swelling but overall she does look much better.  An AP pelvis and lateral left hip shows that the hip replacement is in good position and the femoral head is appropriate located within the acetabulum from earlier revised the liner and head ball.  She will continue to increase her activities as comfort allows.  Will see her back in 6 months with just a standing AP pelvis.  If there are issues before then she knows to reach out and let us know.

## 2023-10-10 ENCOUNTER — Other Ambulatory Visit (HOSPITAL_COMMUNITY): Payer: Self-pay

## 2023-10-11 ENCOUNTER — Ambulatory Visit (HOSPITAL_COMMUNITY)
Admission: RE | Admit: 2023-10-11 | Discharge: 2023-10-11 | Disposition: A | Discharge status: Home / Self Care | Payer: Medicare Other | Source: Ambulatory Visit | Attending: Internal Medicine | Admitting: Internal Medicine

## 2023-10-11 DIAGNOSIS — N189 Chronic kidney disease, unspecified: Secondary | ICD-10-CM | POA: Diagnosis present

## 2023-10-11 DIAGNOSIS — D631 Anemia in chronic kidney disease: Secondary | ICD-10-CM | POA: Insufficient documentation

## 2023-10-11 MED ORDER — SODIUM CHLORIDE 0.9 % IV SOLN
510.0000 mg | Freq: Once | INTRAVENOUS | Status: AC
  Administered 2023-10-11: 510 mg via INTRAVENOUS
  Filled 2023-10-11: qty 510

## 2023-11-09 ENCOUNTER — Encounter: Payer: Self-pay | Admitting: Acute Care

## 2024-01-25 ENCOUNTER — Ambulatory Visit (HOSPITAL_BASED_OUTPATIENT_CLINIC_OR_DEPARTMENT_OTHER)

## 2024-01-25 ENCOUNTER — Encounter (HOSPITAL_BASED_OUTPATIENT_CLINIC_OR_DEPARTMENT_OTHER): Payer: Self-pay | Admitting: Student

## 2024-01-25 ENCOUNTER — Ambulatory Visit (HOSPITAL_BASED_OUTPATIENT_CLINIC_OR_DEPARTMENT_OTHER): Admitting: Student

## 2024-01-25 ENCOUNTER — Encounter: Payer: Self-pay | Admitting: Orthopaedic Surgery

## 2024-01-25 DIAGNOSIS — M5416 Radiculopathy, lumbar region: Secondary | ICD-10-CM | POA: Diagnosis not present

## 2024-01-25 DIAGNOSIS — R1031 Right lower quadrant pain: Secondary | ICD-10-CM | POA: Diagnosis not present

## 2024-01-25 NOTE — Progress Notes (Signed)
 Chief Complaint: Right leg pain    Discussed the use of AI scribe software for clinical note transcription with the patient, who gave verbal consent to proceed.  History of Present Illness Katie Gaines is a 68 year old female with a history of multiple spinal surgeries and left hip replacement who presents with right leg pain and numbness. She experiences severe pain originating in the buttock and radiating down the right leg, around the knee, and to the side of the foot. The pain is constant and severe, with tingling and numbness in her toes. Changing positions or wearing compression socks does not alleviate the pain.  She has difficulty walking, describing her gait as 'wonky', and is frustrated with the persistent pain affecting her daily life. She recently fell and assisted a heavier person, which may have exacerbated her symptoms. She has undergone 13 spinal surgeries mainly with Dr. Lamon Pillow and a left hip replacement with Dr. Lucienne Ryder requiring revision, with the most recent hip surgery in July 2024. She has not seen her spinal surgeon Dr. Lamon Pillow since before her first hip replacement in 2022 and wants to avoid further surgeries.   Surgical History:   Left THA revision 02/14/2023 Right TKA 2023 13 previous back surgeries  PMH/PSH/Family History/Social History/Meds/Allergies:    Past Medical History:  Diagnosis Date   Anemia    Anxiety disorder    Cancer (HCC)    ear   Common bile duct stone    Graves disease    History of kidney stones    Hypertension    Hypothyroidism    Iron deficiency    Lymphedema of leg    bilateral   Past Surgical History:  Procedure Laterality Date   ANTERIOR HIP REVISION Left 03/06/2023   Procedure: LEFT HIP POLY-LINER AND HIP BALL REVISION;  Surgeon: Arnie Lao, MD;  Location: [facility];  Service: Orthopedics;  Laterality: Left;   ANTERIOR LAT LUMBAR FUSION N/A 08/26/2019   Procedure: ANTERIOR LATERAL  LUMBAR INTERBODY FUSION, LUMBAR ONE LUMBAR TWO;  Surgeon: Gearl Keens, MD;  Location: [facility] OR;  Service: Neurosurgery;  Laterality: N/A;  anterolateral   APPENDECTOMY     BACK SURGERY  1987-2007   X 10 Surgery   CHOLECYSTECTOMY     ENTEROSCOPY  11/10/2011   Procedure: ENTEROSCOPY;  Surgeon: Claudette Cue, MD;  Location: [facility];  Service: Endoscopy;  Laterality: N/A;   ERCP  07/19/2011   Procedure: ENDOSCOPIC RETROGRADE CHOLANGIOPANCREATOGRAPHY (ERCP);  Surgeon: Claudette Cue, MD;  Location: [facility];  Service: Endoscopy;  Laterality: N/A;   ESOPHAGOGASTRODUODENOSCOPY  09/2018   FRACTURE SURGERY Left 2018   left arm fracture   HOT HEMOSTASIS  11/10/2011   Procedure: HOT HEMOSTASIS (ARGON PLASMA COAGULATION/BICAP);  Surgeon: Claudette Cue, MD;  Location: [facility];  Service: Endoscopy;  Laterality: N/A;   TOTAL HIP ARTHROPLASTY Left 06/25/2021   Procedure: LEFT TOTAL HIP ARTHROPLASTY ANTERIOR APPROACH;  Surgeon: Arnie Lao, MD;  Location: [facility];  Service: Orthopedics;  Laterality: Left;   TOTAL KNEE ARTHROPLASTY Right 04/26/2022   Procedure: RIGHT TOTAL KNEE ARTHROPLASTY;  Surgeon: Arnie Lao, MD;  Location: [facility] OR;  Service: Orthopedics;  Laterality: Right;   Social History   Socioeconomic History   Marital status: Married    Spouse name: Stu   Number of  children: Not on file   Years of education: Not on file   Highest education level: Not on file  Occupational History   Not on file  Tobacco Use   Smoking status: Former    Current packs/day: 0.00    Average packs/day: 0.5 packs/day for 45.0 years (22.5 ttl pk-yrs)    Types: Cigarettes    Start date: 09/09/1975    Quit date: 09/08/2020    Years since quitting: 3.3   Smokeless tobacco: Never  Vaping Use   Vaping status: Never Used  Substance and Sexual Activity   Alcohol use: Yes    Alcohol/week: 0.0 standard drinks of alcohol    Comment: 1 drink per month    Drug use: No   Sexual activity:  Yes  Other Topics Concern   Not on file  Social History Narrative   Caffeine : 1 soda daily   Social Drivers of Health   Financial Resource Strain: Low Risk  (12/06/2023)   Received from [facility]   Overall Financial Resource Strain (CARDIA)    Difficulty of Paying Living Expenses: Not hard at all  Food Insecurity: No Food Insecurity (12/06/2023)   Received from [facility]   Hunger Vital Sign    Within the past 12 months, you worried that your food would run out before you got the money to buy more.: Never true    Within the past 12 months, the food you bought just didn't last and you didn't have money to get more.: Never true  Transportation Needs: No Transportation Needs (12/06/2023)   Received from [facility] - Transportation    Lack of Transportation (Medical): No    Lack of Transportation (Non-Medical): No  Physical Activity: Sufficiently Active (12/06/2023)   Received from [facility]   Exercise Vital Sign    On average, how many days per week do you engage in moderate to strenuous exercise (like a brisk walk)?: 7 days    On average, how many minutes do you engage in exercise at this level?: 30 min  Stress: No Stress Concern Present (12/06/2023)   Received from [facility] - Occupational Stress Questionnaire    Feeling of Stress : Not at all  Social Connections: Socially Integrated (12/06/2023)   Received from [facility]   Social Network    How would you rate your social network (family, work, friends)?: Good participation with social networks   Family History  Problem Relation Age of Onset   Colon polyps Father    Diabetes Father    Brain cancer Mother    Colon cancer Neg Hx    Malignant hyperthermia Neg Hx    No Known Allergies Current Outpatient Medications  Medication Sig Dispense Refill   amitriptyline  (ELAVIL ) 25 MG tablet Take 25 mg by mouth at bedtime.      amLODipine  (NORVASC ) 5 MG tablet  Take 5 mg by mouth daily.      aspirin  81 MG chewable tablet Chew 1 tablet (81 mg total) by mouth 2 (two) times daily. 35 tablet 0   atorvastatin  (LIPITOR) 10 MG tablet Take 10 mg by mouth daily.     doxycycline  (VIBRA -TABS) 100 MG tablet Take 1 tablet (100 mg total) by mouth 2 (two) times daily. 30 tablet 0   ergocalciferol  (VITAMIN D2) 1.25 MG (50000 UT) capsule Take 50,000 Units by mouth every Sunday.     gabapentin  (NEURONTIN ) 800 MG tablet Take 800 mg by mouth 4 (  four) times daily.      lamoTRIgine  (LAMICTAL ) 150 MG tablet Take 1 tablet (150 mg total) by mouth 2 (two) times daily. Please call us  at [phone] to make follow up visit for further refills. 180 tablet 3   methocarbamol  (ROBAXIN ) 500 MG tablet Take 1 tablet (500 mg total) by mouth every 6 (six) hours as needed for muscle spasms. 40 tablet 1   morphine  (MSIR) 30 MG tablet Take 30 mg by mouth in the morning and at bedtime.     nitroGLYCERIN  (NITROSTAT ) 0.4 MG SL tablet Place 0.4 mg under the tongue every 5 (five) minutes as needed (esophageal spasms).     omeprazole (PRILOSEC) 40 MG capsule Take 40 mg by mouth daily.     oxyCODONE  (OXY IR/ROXICODONE ) 5 MG immediate release tablet Take 1-2 tablets (5-10 mg total) by mouth every 4 (four) hours as needed for moderate pain (pain score 4-6). 30 tablet 0   SYNTHROID  125 MCG tablet Take 125 mcg by mouth daily before breakfast.     No current facility-administered medications for this visit.   No results found.  Review of Systems:   A ROS was performed including pertinent positives and negatives as documented in the HPI.  Physical Exam :   Constitutional: NAD and appears stated age Neurological: Alert and oriented Psych: Appropriate affect and cooperative There were no vitals taken for this visit.   Comprehensive Musculoskeletal Exam:    Mild tenderness throughout the right lumbar region into the right buttock area.  Passive right hip range of motion is fluid to 120 degrees  flexion, 30 degrees external rotation, 20 degrees internal rotation.  Bilateral knee flexion/extension and ankle dorsiflexion/plantarflexion strength 5/5.  Imaging:   Xray (lumbar spine 4 views): Previous lumbar fusion hardware in good positioning without evidence of damage or loosening.  Diffuse multilevel degenerative changes.  Otherwise negative for bony abnormality.  Xray (AP pelvis, right hip 3 views): Left hip THA components in good positioning without evidence of damage or loosening.  Joint space within the right hip joint appears well-maintained.   I personally reviewed and interpreted the radiographs.      Assessment & Plan Lumbar radiculopathy Pain radiates down her right leg with tingling and numbness into the lateral foot, indicating a sciatic and low back origin. X-rays reveal overall well-maintained joint space within the right hip without significant changes compared to previous radiographs. Her extensive back history and existing hardware suggest a back-related issue. She prefers to avoid surgery. Refer back to Dr. Lamon Pillow for back-related evaluation and management.      I personally saw and evaluated the patient, and participated in the management and treatment plan.  Sharrell Deck, PA-C Orthopedics

## 2024-03-05 ENCOUNTER — Other Ambulatory Visit: Payer: Self-pay | Admitting: Student

## 2024-03-05 DIAGNOSIS — M5430 Sciatica, unspecified side: Secondary | ICD-10-CM

## 2024-03-08 ENCOUNTER — Ambulatory Visit
Admission: RE | Admit: 2024-03-08 | Discharge: 2024-03-08 | Disposition: A | Discharge status: Home / Self Care | Source: Ambulatory Visit | Attending: Student | Admitting: Student

## 2024-03-08 DIAGNOSIS — M5430 Sciatica, unspecified side: Secondary | ICD-10-CM

## 2024-03-22 NOTE — Therapy (Signed)
 OUTPATIENT PHYSICAL THERAPY THORACOLUMBAR EVALUATION   Patient Name: Katie Gaines MRN: [id number] DOB:1955/11/12, 68 y.o., female Today's Date: 03/25/2024  END OF SESSION:  PT End of Session - 03/25/24 1344     Visit Number 1    Number of Visits 10    Date for PT Re-Evaluation 05/20/24    Authorization Type MEDICARE (PAPER REFERRAL)    Progress Note Due on Visit 10    PT Start Time 1346    PT Stop Time 1444    PT Time Calculation (min) 58 min    Activity Tolerance Patient limited by pain    Behavior During Therapy [facility] for tasks assessed/performed          Past Medical History:  Diagnosis Date   Anemia    Anxiety disorder    Cancer (HCC)    ear   Common bile duct stone    Graves disease    History of kidney stones    Hypertension    Hypothyroidism    Iron deficiency    Lymphedema of leg    bilateral   Past Surgical History:  Procedure Laterality Date   ANTERIOR HIP REVISION Left 03/06/2023   Procedure: LEFT HIP POLY-LINER AND HIP BALL REVISION;  Surgeon: Vernetta Lonni GRADE, MD;  Location: [facility];  Service: Orthopedics;  Laterality: Left;   ANTERIOR LAT LUMBAR FUSION N/A 08/26/2019   Procedure: ANTERIOR LATERAL LUMBAR INTERBODY FUSION, LUMBAR ONE LUMBAR TWO;  Surgeon: Onetha Kuba, MD;  Location: [facility] OR;  Service: Neurosurgery;  Laterality: N/A;  anterolateral   APPENDECTOMY     BACK SURGERY  1987-2007   X 10 Surgery   CHOLECYSTECTOMY     ENTEROSCOPY  11/10/2011   Procedure: ENTEROSCOPY;  Surgeon: Lamar JONETTA Aho, MD;  Location: [facility];  Service: Endoscopy;  Laterality: N/A;   ERCP  07/19/2011   Procedure: ENDOSCOPIC RETROGRADE CHOLANGIOPANCREATOGRAPHY (ERCP);  Surgeon: Lamar JONETTA Aho, MD;  Location: [facility];  Service: Endoscopy;  Laterality: N/A;   ESOPHAGOGASTRODUODENOSCOPY  09/2018   FRACTURE SURGERY Left 2018   left arm fracture   HOT HEMOSTASIS  11/10/2011   Procedure: HOT HEMOSTASIS (ARGON PLASMA COAGULATION/BICAP);  Surgeon: Lamar JONETTA Aho,  MD;  Location: [facility];  Service: Endoscopy;  Laterality: N/A;   TOTAL HIP ARTHROPLASTY Left 06/25/2021   Procedure: LEFT TOTAL HIP ARTHROPLASTY ANTERIOR APPROACH;  Surgeon: Vernetta Lonni GRADE, MD;  Location: [facility];  Service: Orthopedics;  Laterality: Left;   TOTAL KNEE ARTHROPLASTY Right 04/26/2022   Procedure: RIGHT TOTAL KNEE ARTHROPLASTY;  Surgeon: Vernetta Lonni GRADE, MD;  Location: [facility] OR;  Service: Orthopedics;  Laterality: Right;   Patient Active Problem List   Diagnosis Date Noted   Status post revision of total hip 03/06/2023   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) 02/23/2023   Arthritis of knee, right 04/26/2022   Status post total right knee replacement 04/26/2022   Status post left hip replacement 06/25/2021   Fusion of spine of lumbar region 03/17/2021   Common peroneal neuropathy, left 06/02/2020   Dysesthesia 03/16/2020   Numbness 03/16/2020   Visual changes 03/16/2020   History of fusion of cervical spine 03/16/2020   History of fusion of lumbar spine 03/16/2020   Fatigue 11/29/2019   DDD (degenerative disc disease), lumbar 08/26/2019   Gastroesophageal reflux disease 06/17/2019   Vitamin D deficiency 06/17/2019   Irritable bowel syndrome with diarrhea 03/06/2019   Closed fracture of distal end of radius 10/04/2018   Injury of right wrist 09/13/2018  Low back pain 07/15/2016   Pain syndrome, chronic 09/09/2012   Postlaminectomy syndrome, not elsewhere classified 09/09/2012   Anemia 05/30/2012   Essential hypertension 02/13/2012   Chronic gastrointestinal bleeding 01/04/2012   Hypothyroidism 01/04/2012   Obstruction of bile duct 07/19/2011   Abnormal radiographic examination 07/19/2011   IRON DEFICIENCY 09/10/2009    PCP: Massie Rosalva Sewer   REFERRING PROVIDER: Arley SHAUNNA Onetha Mickey, MD   REFERRING DIAG: M54.40 Lumbago with sciatica, unspecified side   Rationale for Evaluation and Treatment: Rehabilitation  THERAPY DIAG:  Other  low back pain  Other abnormalities of gait and mobility  Muscle weakness (generalized)  ONSET DATE: June 3rd ; same day she had her fall   SUBJECTIVE:                                                                                                                                                                                           SUBJECTIVE STATEMENT: I'm the healthiest unhealthy person ever,  PERTINENT HISTORY:  Patient has had 13 spinal surgeries, Lt hip replacement with a revision, and Rt knee replacement. Patient has residual deficits in Lt LE sensation leading to diminished light touch which she is now having in the Rt LE. Patient had a fall on June 3rd where she landed on her buttocks which caused slight low back pain, but then was helping a friend out of a lawn chair where the friend was dead weight which is the cause of her significant low back pain. Patient has had a MRI as well as CT which she has a follow up for March 28, 2024. Patient states I received the CT results on MyChart and it said '4mm pedicle screws loosening at bilat L2'. Patient endorses pain in Rt groin and quadriceps with n/t down entire Rt LE.   PAIN:  Are you having pain? Yes: NPRS scale: 4/10 while resting, 9/10 at worst  Pain location: right lower back with intermittent radiating to quad, lower leg, and foot  Pain description: n/t, dull, achey, throbbing  Aggravating factors: walking, pain is pretty consistent Relieving factors: n/a  PRECAUTIONS: None  RED FLAGS: None   WEIGHT BEARING RESTRICTIONS: No  FALLS:  Has patient fallen in last 6 months? Yes. Number of falls 1; January 09, 2024 fell backwards onto buttocks on a box of Christmas ornaments   LIVING ENVIRONMENT: Lives with: lives with their spouse Lives in: House/apartment Stairs: Yes: External: 2 steps; on right going up Has following equipment at home: Single point cane, Quad cane small base, Walker - 2 wheeled, Environmental consultant - 4 wheeled,  Crutches, and Wheelchair (manual)  OCCUPATION:  PLOF: Independent  PATIENT GOALS: strengthen core, learn appropriate form/technique   NEXT MD VISIT: Thursday March 28, 2024  OBJECTIVE:  Note: Objective measures were completed at Evaluation unless otherwise noted.  DIAGNOSTIC FINDINGS:  MRI scan lumbar spine overall looks good no disc herniation, no adjacent level disease. There is some edema in the L2 vertebral body.     PATIENT SURVEYS:  PSFS: THE PATIENT SPECIFIC FUNCTIONAL SCALE  Place score of 0-10 (0 = unable to perform activity and 10 = able to perform activity at the same level as before injury or problem)  Activity Date: 03/25/2024    Gardening   5    2. Walking   1    3. Sitting in the car   5    4.      Total Score 3.66      Total Score = Sum of activity scores/number of activities  Minimally Detectable Change: 3 points (for single activity); 2 points (for average score)  [facility] (nd). The Patient Specific Functional Scale . Retrieved from [email or web address]   COGNITION: Overall cognitive status: Within functional limits for tasks assessed     SENSATION: Light touch: diminished compared to normal   MUSCLE LENGTH: Not tested on eval   POSTURE: rounded shoulders, forward head, and increased thoracic kyphosis  PALPATION: Not assessed on eval   LUMBAR ROM:   AROM Eval 03/25/2024  Flexion WFL, but painful  Extension 5% and highly painful  Right lateral flexion 5% and highly painful  Left lateral flexion 5% and highly painful  Right rotation Unable   Left rotation Unable    (Blank rows = not tested)  LOWER EXTREMITY ROM:     ROM Right Eval 03/25/2024 Left Eval 03/25/2024  Hip flexion    Hip extension    Hip abduction    Hip adduction    Hip internal rotation    Hip external rotation    Knee flexion    Knee extension    Ankle dorsiflexion    Ankle plantarflexion     Ankle inversion    Ankle eversion     (Blank rows = not tested)  LOWER EXTREMITY MMT:    MMT Right Eval 03/25/2024 Left Eval 03/25/2024  Hip flexion 3/5 3/5  Hip extension 2+/5 Unable to test   Hip abduction 2+/5 Unable to test   Hip adduction    Hip internal rotation    Hip external rotation    Knee flexion 5/5 5/5  Knee extension 5/5 5/5  Ankle dorsiflexion    Ankle plantarflexion 4/5 3+/5  Ankle inversion    Ankle eversion     (Blank rows = not tested)  LUMBAR SPECIAL TESTS:  Not assessed on eval   FUNCTIONAL TESTS:  Not assessed on eval   GAIT: Distance walked: not formally assessed  Assistive device utilized: None Level of assistance: supervision  Comments: wide BOS, antalgic gait pattern, bilat short steps  TREATMENT DATE:  03/25/2024 TherEx:  HEP handout provided with patient performing one set of each activity for appropriate form. Verbal and tactile cues required.  PATIENT EDUCATION:  Education details: HEP, pain vs discomfort, ROM, plan of care  Person educated: Patient Education method: Explanation, Demonstration, Tactile cues, Verbal cues, and Handouts Education comprehension: verbalized understanding, returned demonstration, verbal cues required, and tactile cues required  HOME EXERCISE PROGRAM: Access Code: [email or web address] URL: [email or web address] Date: 03/25/2024 Prepared by: Susannah Daring  Exercises - Mini Squat with Counter Support  - 1 x daily - 7 x weekly - 2 sets - 10 reps - Standing Hip Abduction with Counter Support  - 1 x daily - 7 x weekly - 2 sets - 10 reps - Standing Hip Extension with Counter Support  - 1 x daily - 7 x weekly - 2 sets - 10 reps - Seated Active Straight-Leg Raise  - 1 x daily - 7 x weekly - 2 sets - 10 reps - Seated Abdominal Press into [employer]  - 1 x daily - 7 x weekly - 2  sets - 10 reps - 2-3s hold - Supine March  - 1 x daily - 7 x weekly - 2 sets - 10 reps  ASSESSMENT:  CLINICAL IMPRESSION: Patient is a 68 y.o. F who was seen today for physical therapy evaluation and treatment for low back pain with radiculopathy with functional mobility deficits, strength deficits, high pain levels, gait deficits, and significant PMH. Patient is mostly limited by high pain levels and has had to rely on using a RW within the home to complete ADLs. Patient is having a follow up appointment with MD on March 28, 2024 to officially go over results of MRI and CT scan. Patient will benefit from skilled PT to address above noted deficits.    OBJECTIVE IMPAIRMENTS: Abnormal gait, decreased activity tolerance, decreased balance, decreased mobility, difficulty walking, decreased ROM, decreased strength, hypomobility, impaired sensation, improper body mechanics, postural dysfunction, and pain.   ACTIVITY LIMITATIONS: lifting, bending, sitting, standing, squatting, sleeping, stairs, and bed mobility  PARTICIPATION LIMITATIONS: driving, shopping, community activity, and yard work  PERSONAL FACTORS: Past/current experiences, Time since onset of injury/illness/exacerbation, and 3+ comorbidities: anemia, anxiety, HTN, thyroid disease, Hx of cancer are also affecting patient's functional outcome.   REHAB POTENTIAL: Good  CLINICAL DECISION MAKING: Evolving/moderate complexity  EVALUATION COMPLEXITY: Moderate   GOALS: Goals reviewed with patient? Yes  SHORT TERM GOALS: Target date: 04/15/2024  Patient will be compliant with initial HEP.  Baseline: Goal status: INITIAL  2.  Patient will report pain levels no greater than 5/10 to show improvement in overall quality of life. Baseline:  Goal status: INITIAL     LONG TERM GOALS: Target date: 05/20/2024  Patient will be independent with final HEP in order to maintain and progress upon functional gains made within PT. Baseline:  Goal  status: INITIAL  2.  Patient will report pain levels no greater than 3/10 in order to show improvement in overall quality of life. Baseline:  Goal status: INITIAL  3.  Patient will increase PSFS to at least 5.66 to show significant improvement in subjective disability rating. Baseline:  Goal status: INITIAL  4.  Patient will increase lumbar extension ROM to at least 50% in order to show improvement in functional mobility. Baseline:  Goal status: INITIAL  5.  Patient will increase hip extension MMT to at least 4/5 in order to show improvement in biomechanics with functional mobility. Baseline:  Goal status: INITIAL  PLAN:  PT FREQUENCY: 1x/week  PT DURATION: 8 weeks  PLANNED INTERVENTIONS: 97164- PT Re-evaluation, 97750- Physical Performance Testing, 97110-Therapeutic exercises, 97530- Therapeutic activity,  02887- Neuromuscular re-education, [phone]- Self Care, 02859- Manual therapy, [id number]- Gait training, [phone]- Electrical stimulation (unattended), [phone]- Electrical stimulation (manual), [id number]- Vasopneumatic device, [id number]- Ultrasound, [id number]- Traction (mechanical), [id number]- Ionotophoresis 4mg /ml Dexamethasone, 20560 (1-2 muscles), 20561 (3+ muscles)- Dry Needling, Patient/Family education, Balance training, Stair training, Taping, Joint mobilization, Joint manipulation, Spinal manipulation, Spinal mobilization, Scar mobilization, DME instructions, Cryotherapy, and Moist heat.  PLAN FOR NEXT SESSION: assess HEP, lumbar ROM, hip musculature strengthening, core strengthening, assess follow up with MD    Susannah Daring, PT, DPT 03/25/24 3:38 PM

## 2024-03-25 ENCOUNTER — Ambulatory Visit (INDEPENDENT_AMBULATORY_CARE_PROVIDER_SITE_OTHER)

## 2024-03-25 DIAGNOSIS — M6281 Muscle weakness (generalized): Secondary | ICD-10-CM | POA: Diagnosis not present

## 2024-03-25 DIAGNOSIS — R2689 Other abnormalities of gait and mobility: Secondary | ICD-10-CM | POA: Diagnosis not present

## 2024-03-25 DIAGNOSIS — M5459 Other low back pain: Secondary | ICD-10-CM

## 2024-04-02 ENCOUNTER — Other Ambulatory Visit: Payer: Self-pay | Admitting: Neurosurgery

## 2024-04-03 ENCOUNTER — Other Ambulatory Visit (INDEPENDENT_AMBULATORY_CARE_PROVIDER_SITE_OTHER): Payer: Self-pay

## 2024-04-03 ENCOUNTER — Encounter: Payer: Self-pay | Admitting: Orthopaedic Surgery

## 2024-04-03 ENCOUNTER — Ambulatory Visit (INDEPENDENT_AMBULATORY_CARE_PROVIDER_SITE_OTHER): Payer: Medicare Other | Admitting: Orthopaedic Surgery

## 2024-04-03 DIAGNOSIS — Z96642 Presence of left artificial hip joint: Secondary | ICD-10-CM

## 2024-04-03 NOTE — Progress Notes (Signed)
 The patient is a 68 year old female who is just over a year out from a right hip revision arthroplasty.  She had had a anterior hip replacement but then had failure of the poly liner.  Since then she had a revision of an anterior approach back in late July last year.  She had the hip is doing well.  She is walking without assistive device.  She does have lumbar spine surgery by one of my colleagues in town in October.  She is heading to the beach soon next month.  Her left operative hip moves smoothly and fluidly and her leg lengths are equal.  Her incision has healed nicely.  There is scar tissue to be expected.  An AP pelvis shows a well-seated left total hip arthroplasty with no worrisome findings or complicating features.  The implants are bone ingrown.  This point follow-up can be as needed for the left hip.  If there are any issues at all from a orthopedic standpoint she knows to reach out to us  immediately.

## 2024-04-15 ENCOUNTER — Encounter

## 2024-04-22 ENCOUNTER — Encounter

## 2024-04-29 ENCOUNTER — Encounter

## 2024-05-01 NOTE — Pre-Procedure Instructions (Signed)
 Surgical Instructions   Your procedure is scheduled on May 13, 2024. Report to [facility] Main Entrance A at 5:30 A.M., then check in with the Admitting office. Any questions or running late day of surgery: call [phone]  Questions prior to your surgery date: call [phone], Monday-Friday, 8am-4pm. If you experience any cold or flu symptoms such as cough, fever, chills, shortness of breath, etc. between now and your scheduled surgery, please notify us  at the above number.     Remember:  Do not eat or drink after midnight the night before your surgery    Take these medicines the morning of surgery with A SIP OF WATER : amLODipine  (NORVASC )  atorvastatin  (LIPITOR)  gabapentin  (NEURONTIN )  lamoTRIgine  (LAMICTAL )  morphine  (MSIR)  omeprazole (PRILOSEC)  SYNTHROID     May take these medicines IF NEEDED: methocarbamol  (ROBAXIN )  nitroGLYCERIN  (NITROSTAT )    One week prior to surgery, STOP taking any Aspirin , Aleve, Naproxen, Ibuprofen, Motrin, Advil, Goody's, BC's, all herbal medications, fish oil, and non-prescription vitamins.                     Do NOT Smoke (Tobacco/Vaping) for 24 hours prior to your procedure.  If you use a CPAP at night, you may bring your mask/headgear for your overnight stay.   You will be asked to remove any contacts, glasses, piercing's, hearing aid's, dentures/partials prior to surgery. Please bring cases for these items if needed.    Patients discharged the day of surgery will not be allowed to drive home, and someone needs to stay with them for 24 hours.  SURGICAL WAITING ROOM VISITATION Patients may have no more than 2 support people in the waiting area - these visitors may rotate.   Pre-op nurse will coordinate an appropriate time for 1 ADULT support person, who may not rotate, to accompany patient in pre-op.  Children under the age of 71 must have an adult with them who is not the patient and must remain in the main waiting area with an  adult.  If the patient needs to stay at the hospital during part of their recovery, the visitor guidelines for inpatient rooms apply.  Please refer to the [facility] website for the visitor guidelines for any additional information.   If you received a COVID test during your pre-op visit  it is requested that you wear a mask when out in public, stay away from anyone that may not be feeling well and notify your surgeon if you develop symptoms. If you have been in contact with anyone that has tested positive in the last 10 days please notify you surgeon.      Pre-operative 5 CHG Bathing Instructions   You can play a key role in reducing the risk of infection after surgery. Your skin needs to be as free of germs as possible. You can reduce the number of germs on your skin by washing with CHG (chlorhexidine  gluconate) soap before surgery. CHG is an antiseptic soap that kills germs and continues to kill germs even after washing.   DO NOT use if you have an allergy to chlorhexidine /CHG or antibacterial soaps. If your skin becomes reddened or irritated, stop using the CHG and notify one of our RNs at [phone].   Please shower with the CHG soap starting 4 days before surgery using the following schedule:     Please keep in mind the following:  DO NOT shave, including legs and underarms, starting the day of your first shower.  You may shave your face at any point before/day of surgery.  Place clean sheets on your bed the day you start using CHG soap. Use a clean washcloth (not used since being washed) for each shower. DO NOT sleep with pets once you start using the CHG.   CHG Shower Instructions:  Wash your face and private area with normal soap. If you choose to wash your hair, wash first with your normal shampoo.  After you use shampoo/soap, rinse your hair and body thoroughly to remove shampoo/soap residue.  Turn the water  OFF and apply about 3 tablespoons (45 ml) of CHG soap to a  CLEAN washcloth.  Apply CHG soap ONLY FROM YOUR NECK DOWN TO YOUR TOES (washing for 3-5 minutes)  DO NOT use CHG soap on face, private areas, open wounds, or sores.  Pay special attention to the area where your surgery is being performed.  If you are having back surgery, having someone wash your back for you may be helpful. Wait 2 minutes after CHG soap is applied, then you may rinse off the CHG soap.  Pat dry with a clean towel  Put on clean clothes/pajamas   If you choose to wear lotion, please use ONLY the CHG-compatible lotions that are listed below.  Additional instructions for the day of surgery: DO NOT APPLY any lotions, deodorants, cologne, or perfumes.   Do not bring valuables to the hospital. [facility] is not responsible for any belongings/valuables. Do not wear nail polish, gel polish, artificial nails, or any other type of covering on natural nails (fingers and toes) Do not wear jewelry or makeup Put on clean/comfortable clothes.  Please brush your teeth.  Ask your nurse before applying any prescription medications to the skin.     CHG Compatible Lotions   Aveeno Moisturizing lotion  Cetaphil Moisturizing Cream  Cetaphil Moisturizing Lotion  Clairol Herbal Essence Moisturizing Lotion, Dry Skin  Clairol Herbal Essence Moisturizing Lotion, Extra Dry Skin  Clairol Herbal Essence Moisturizing Lotion, Normal Skin  Curel Age Defying Therapeutic Moisturizing Lotion with Alpha Hydroxy  Curel Extreme Care Body Lotion  Curel Soothing Hands Moisturizing Hand Lotion  Curel Therapeutic Moisturizing Cream, Fragrance-Free  Curel Therapeutic Moisturizing Lotion, Fragrance-Free  Curel Therapeutic Moisturizing Lotion, Original Formula  Eucerin Daily Replenishing Lotion  Eucerin Dry Skin Therapy Plus Alpha Hydroxy Crme  Eucerin Dry Skin Therapy Plus Alpha Hydroxy Lotion  Eucerin Original Crme  Eucerin Original Lotion  Eucerin Plus Crme Eucerin Plus Lotion  Eucerin TriLipid  Replenishing Lotion  Keri Anti-Bacterial Hand Lotion  Keri Deep Conditioning Original Lotion Dry Skin Formula Softly Scented  Keri Deep Conditioning Original Lotion, Fragrance Free Sensitive Skin Formula  Keri Lotion Fast Absorbing Fragrance Free Sensitive Skin Formula  Keri Lotion Fast Absorbing Softly Scented Dry Skin Formula  Keri Original Lotion  Keri Skin Renewal Lotion Keri Silky Smooth Lotion  Keri Silky Smooth Sensitive Skin Lotion  Nivea Body Creamy Conditioning Oil  Nivea Body Extra Enriched Lotion  Nivea Body Original Lotion  Nivea Body Sheer Moisturizing Lotion Nivea Crme  Nivea Skin Firming Lotion  NutraDerm 30 Skin Lotion  NutraDerm Skin Lotion  NutraDerm Therapeutic Skin Cream  NutraDerm Therapeutic Skin Lotion  ProShield Protective Hand Cream  Provon moisturizing lotion  Please read over the following fact sheets that you were given.

## 2024-05-02 ENCOUNTER — Other Ambulatory Visit: Payer: Self-pay

## 2024-05-02 ENCOUNTER — Encounter (HOSPITAL_COMMUNITY)
Admission: RE | Admit: 2024-05-02 | Discharge: 2024-05-02 | Disposition: A | Discharge status: Home / Self Care | Source: Ambulatory Visit | Attending: Neurosurgery | Admitting: Neurosurgery

## 2024-05-02 ENCOUNTER — Encounter (HOSPITAL_COMMUNITY): Payer: Self-pay

## 2024-05-02 VITALS — BP 115/72 | HR 77 | Temp 98.2°F | Resp 16 | Ht 66.0 in | Wt 145.0 lb

## 2024-05-02 DIAGNOSIS — I2510 Atherosclerotic heart disease of native coronary artery without angina pectoris: Secondary | ICD-10-CM | POA: Diagnosis not present

## 2024-05-02 DIAGNOSIS — Z01818 Encounter for other preprocedural examination: Secondary | ICD-10-CM | POA: Diagnosis present

## 2024-05-02 HISTORY — DX: Unspecified osteoarthritis, unspecified site: M19.90

## 2024-05-02 LAB — BASIC METABOLIC PANEL WITH GFR
Anion gap: 13 (ref 5–15)
BUN: 13 mg/dL (ref 8–23)
CO2: 27 mmol/L (ref 22–32)
Calcium: 9 mg/dL (ref 8.9–10.3)
Chloride: 101 mmol/L (ref 98–111)
Creatinine, Ser: 0.72 mg/dL (ref 0.44–1.00)
GFR, Estimated: >60 mL/min (ref >60)
Glucose, Bld: 93 mg/dL (ref 70–99)
Potassium: 3.9 mmol/L (ref 3.5–5.1)
Sodium: 141 mmol/L (ref 135–145)

## 2024-05-02 LAB — CBC
HCT: 43.6 % (ref 36.0–46.0)
Hemoglobin: 14 g/dL (ref 12.0–15.0)
MCH: 29.2 pg (ref 26.0–34.0)
MCHC: 32.1 g/dL (ref 30.0–36.0)
MCV: 91 fL (ref 80.0–100.0)
Platelets: 229 K/uL (ref 150–400)
RBC: 4.79 MIL/uL (ref 3.87–5.11)
RDW: 12.6 % (ref 11.5–15.5)
WBC: 7.5 K/uL (ref 4.0–10.5)
nRBC: 0 % (ref 0.0–0.2)

## 2024-05-02 LAB — TYPE AND SCREEN
ABO/RH(D): O POS
Antibody Screen: NEGATIVE

## 2024-05-02 LAB — SURGICAL PCR SCREEN
MRSA, PCR: NEGATIVE
Staphylococcus aureus: NEGATIVE

## 2024-05-02 NOTE — Progress Notes (Signed)
 PCP - Massie Sewer, DO Cardiologist - Per pt, many years ago for CP but later determined to be esophageal spams. Pt denies any cardias testing completed  PPM/ICD - Denies Device Orders - n/a Rep Notified - n/a  Chest x-ray - n/a EKG - 05/02/2024 Stress Test - Denies ECHO - Denies Cardiac Cath - Denies  Sleep Study - Denies CPAP - n/a  No DM  Last dose of GLP1 agonist- n/a GLP1 instructions: n/a  Blood Thinner Instructions: n/a Aspirin  Instructions: n/a  NPO after midnight  COVID TEST- n/a   Anesthesia review: Yes. Discussed with Lynwood Hope, PA-C. Pt takes nitroglycerin  2-3x/year for esophageal spasms. Pt states she saw a cardiologist years ago to rule out cardiac causes, but no testing was completed. 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    Patient denies shortness of breath, fever, cough and chest pain at PAT appointment. Pt denies any respiratory illness/infection in the last two months.    All instructions explained to the patient, with a verbal understanding of the material. Patient agrees to go over the instructions while at home for a better understanding. Patient also instructed to self quarantine after being tested for COVID-19. The opportunity to ask questions was provided.

## 2024-05-06 ENCOUNTER — Encounter

## 2024-05-06 NOTE — Progress Notes (Signed)
 Anesthesia Chart Review:  68 year old female with pertinent history including former smoker (22.5 pack years, quit 2022), HTN, GERD on PPI, esophageal spasm, hypothyroidism, anxiety, chronic pain, numerous prior spine surgeries including C3-T1 ACDF and L1-S1 lumbar fusion.  Patient states that she has history of esophageal spasms and rarely uses nitroglycerin  for this (reportedly 2-3 times per year).  This is managed by her PCP Dr. Valentin. She states the symptoms are generally brought on by rapid position changes.  She denies any symptoms with exertion and states she has never had exertional limitations or exertional chest pain.  She reports she is quite active and can do heavy yard work without any cardiopulmonary complaints.  Her primarily limitation is low back pain.  She has tolerated numerous orthopedic surgeries without issue.  Clearance from Dr. Valentin states patient is low to moderate risk, nothing further to optimize prior to surgery.  Copy scanned in media.  Preop labs reviewed, WNL.  EKG 05/02/2024: NSR.  Rate 67.     Lynwood Geofm RIGGERS [facility] Short Stay Center/Anesthesiology Phone [phone] 05/09/2024 3:00 PM

## 2024-05-09 NOTE — Anesthesia Preprocedure Evaluation (Addendum)
 Anesthesia Evaluation  Patient identified by MRN, date of birth, ID band Patient awake    Reviewed: Allergy & Precautions, NPO status , Patient's Chart, lab work & pertinent test results, reviewed documented beta blocker date and time   History of Anesthesia Complications Negative for: history of anesthetic complications  Airway Mallampati: I       Dental  (+) Teeth Intact, Dental Advisory Given   Pulmonary former smoker   breath sounds clear to auscultation       Cardiovascular hypertension,  Rhythm:Regular Rate:Normal     Neuro/Psych    GI/Hepatic ,GERD  ,,  Endo/Other  Hypothyroidism    Renal/GU      Musculoskeletal   Abdominal   Peds  Hematology   Anesthesia Other Findings   Reproductive/Obstetrics                              Anesthesia Physical Anesthesia Plan  ASA: 3  Anesthesia Plan: General   Post-op Pain Management:    Induction: Intravenous  PONV Risk Score and Plan: 2  Airway Management Planned: Oral ETT and Video Laryngoscope Planned  Additional Equipment:   Intra-op Plan:   Post-operative Plan: Extubation in OR  Informed Consent:      Dental advisory given  Plan Discussed with: CRNA and Surgeon  Anesthesia Plan Comments: (PAT note by Lynwood Hope, PA-C: 68 year old female with pertinent history including former smoker (22.5 pack years, quit 2022), HTN, GERD on PPI, esophageal spasm, hypothyroidism, anxiety, chronic pain, numerous prior spine surgeries including C3-T1 ACDF and L1-S1 lumbar fusion.  Patient states that she has history of esophageal spasms and rarely uses nitroglycerin  for this (reportedly 2-3 times per year).  This is managed by her PCP Dr. Valentin. She states the symptoms are generally brought on by rapid position changes.  She denies any symptoms with exertion and states she has never had exertional limitations or exertional chest pain.   She reports she is quite active and can do heavy yard work without any cardiopulmonary complaints.  Her primarily limitation is low back pain.  She has tolerated numerous orthopedic surgeries without issue.  Clearance from Dr. Valentin states patient is low to moderate risk, nothing further to optimize prior to surgery.  Copy scanned in media.  Preop labs reviewed, WNL.  EKG 05/02/2024: NSR.  Rate 67.  )         Anesthesia Quick Evaluation

## 2024-05-13 ENCOUNTER — Ambulatory Visit (HOSPITAL_COMMUNITY)

## 2024-05-13 ENCOUNTER — Ambulatory Visit (HOSPITAL_COMMUNITY)
Admission: RE | Admit: 2024-05-13 | Discharge: 2024-05-13 | Disposition: A | Discharge status: Home / Self Care | Attending: Neurosurgery | Admitting: Neurosurgery

## 2024-05-13 ENCOUNTER — Encounter (HOSPITAL_COMMUNITY)
Admission: RE | Disposition: A | Discharge status: Home / Self Care | Payer: Self-pay | Source: Home / Self Care | Attending: Neurosurgery

## 2024-05-13 ENCOUNTER — Ambulatory Visit (HOSPITAL_COMMUNITY): Payer: Self-pay | Admitting: Physician Assistant

## 2024-05-13 ENCOUNTER — Encounter (HOSPITAL_COMMUNITY): Payer: Self-pay | Admitting: Neurosurgery

## 2024-05-13 ENCOUNTER — Ambulatory Visit (HOSPITAL_COMMUNITY): Admitting: Anesthesiology

## 2024-05-13 ENCOUNTER — Other Ambulatory Visit: Payer: Self-pay

## 2024-05-13 ENCOUNTER — Encounter

## 2024-05-13 DIAGNOSIS — I10 Essential (primary) hypertension: Secondary | ICD-10-CM

## 2024-05-13 DIAGNOSIS — Z981 Arthrodesis status: Secondary | ICD-10-CM | POA: Insufficient documentation

## 2024-05-13 DIAGNOSIS — K219 Gastro-esophageal reflux disease without esophagitis: Secondary | ICD-10-CM | POA: Diagnosis not present

## 2024-05-13 DIAGNOSIS — Z87891 Personal history of nicotine dependence: Secondary | ICD-10-CM

## 2024-05-13 DIAGNOSIS — M960 Pseudarthrosis after fusion or arthrodesis: Secondary | ICD-10-CM | POA: Diagnosis not present

## 2024-05-13 DIAGNOSIS — E039 Hypothyroidism, unspecified: Secondary | ICD-10-CM | POA: Insufficient documentation

## 2024-05-13 DIAGNOSIS — S32009K Unspecified fracture of unspecified lumbar vertebra, subsequent encounter for fracture with nonunion: Secondary | ICD-10-CM | POA: Diagnosis not present

## 2024-05-13 HISTORY — PX: LAMINECTOMY WITH POSTERIOR LATERAL ARTHRODESIS LEVEL 2: SHX6336

## 2024-05-13 SURGERY — LAMINECTOMY WITH POSTERIOR LATERAL ARTHRODESIS LEVEL 2
Anesthesia: General | Site: Back

## 2024-05-13 MED ORDER — CYCLOBENZAPRINE HCL 10 MG PO TABS: 10.0000 mg | ORAL_TABLET | Freq: Three times a day (TID) | ORAL | Status: DC | PRN

## 2024-05-13 MED ORDER — GABAPENTIN 400 MG PO CAPS
800.0000 mg | ORAL_CAPSULE | Freq: Four times a day (QID) | ORAL | Status: DC
  Administered 2024-05-13: 800 mg via ORAL
  Filled 2024-05-13: qty 2

## 2024-05-13 MED ORDER — ACETAMINOPHEN 325 MG PO TABS: 650.0000 mg | ORAL_TABLET | ORAL | Status: DC | PRN

## 2024-05-13 MED ORDER — PHENYLEPHRINE HCL-NACL 20-0.9 MG/250ML-% IV SOLN
INTRAVENOUS | Status: DC | PRN
  Administered 2024-05-13: 30 ug/min via INTRAVENOUS

## 2024-05-13 MED ORDER — ATORVASTATIN CALCIUM 10 MG PO TABS: 10.0000 mg | ORAL_TABLET | Freq: Every day | ORAL | Status: DC

## 2024-05-13 MED ORDER — FENTANYL CITRATE (PF) 100 MCG/2ML IJ SOLN
INTRAMUSCULAR | Status: AC
  Filled 2024-05-13: qty 2

## 2024-05-13 MED ORDER — DEXAMETHASONE SODIUM PHOSPHATE 10 MG/ML IJ SOLN
INTRAMUSCULAR | Status: AC
  Filled 2024-05-13: qty 1

## 2024-05-13 MED ORDER — FENTANYL CITRATE (PF) 250 MCG/5ML IJ SOLN
INTRAMUSCULAR | Status: AC
  Filled 2024-05-13: qty 5

## 2024-05-13 MED ORDER — THROMBIN 20000 UNITS EX SOLR
CUTANEOUS | Status: AC
  Filled 2024-05-13: qty 20000

## 2024-05-13 MED ORDER — ONDANSETRON HCL 4 MG/2ML IJ SOLN: 4.0000 mg | Freq: Four times a day (QID) | INTRAMUSCULAR | Status: DC | PRN

## 2024-05-13 MED ORDER — CHLORHEXIDINE GLUCONATE CLOTH 2 % EX PADS: 6.0000 | MEDICATED_PAD | Freq: Once | CUTANEOUS | Status: DC

## 2024-05-13 MED ORDER — PHENYLEPHRINE 80 MCG/ML (10ML) SYRINGE FOR IV PUSH (FOR BLOOD PRESSURE SUPPORT)
PREFILLED_SYRINGE | INTRAVENOUS | Status: DC | PRN
  Administered 2024-05-13: 160 ug via INTRAVENOUS

## 2024-05-13 MED ORDER — PROPOFOL 10 MG/ML IV BOLUS
INTRAVENOUS | Status: AC
  Filled 2024-05-13: qty 20

## 2024-05-13 MED ORDER — LACTULOSE 10 GM/15ML PO SOLN: 30.0000 g | Freq: Every day | ORAL | Status: DC | PRN

## 2024-05-13 MED ORDER — MIDAZOLAM HCL 2 MG/2ML IJ SOLN
INTRAMUSCULAR | Status: AC
  Filled 2024-05-13: qty 2

## 2024-05-13 MED ORDER — KETAMINE HCL 10 MG/ML IJ SOLN
INTRAMUSCULAR | Status: DC | PRN
  Administered 2024-05-13: 30 mg via INTRAVENOUS

## 2024-05-13 MED ORDER — SODIUM CHLORIDE 0.9% FLUSH: 3.0000 mL | INTRAVENOUS | Status: DC | PRN

## 2024-05-13 MED ORDER — CHLORHEXIDINE GLUCONATE 0.12 % MT SOLN: 15.0000 mL | Freq: Once | OROMUCOSAL | Status: DC

## 2024-05-13 MED ORDER — LIDOCAINE 2% (20 MG/ML) 5 ML SYRINGE
INTRAMUSCULAR | Status: DC | PRN
  Administered 2024-05-13: 100 mg via INTRAVENOUS

## 2024-05-13 MED ORDER — EPHEDRINE SULFATE-NACL 50-0.9 MG/10ML-% IV SOSY
PREFILLED_SYRINGE | INTRAVENOUS | Status: DC | PRN
Start: 2024-05-13 — End: 2024-05-13
  Administered 2024-05-13: 10 mg via INTRAVENOUS
  Administered 2024-05-13: 15 mg via INTRAVENOUS

## 2024-05-13 MED ORDER — OXYCODONE HCL 5 MG/5ML PO SOLN: 5.0000 mg | Freq: Once | ORAL | Status: DC | PRN

## 2024-05-13 MED ORDER — PROPOFOL 10 MG/ML IV BOLUS
INTRAVENOUS | Status: DC | PRN
  Administered 2024-05-13: 110 mg via INTRAVENOUS

## 2024-05-13 MED ORDER — THROMBIN 20000 UNITS EX SOLR
CUTANEOUS | Status: DC | PRN
  Administered 2024-05-13: 20 mL via TOPICAL

## 2024-05-13 MED ORDER — OXYCODONE HCL 5 MG PO TABS: 5.0000 mg | ORAL_TABLET | Freq: Once | ORAL | Status: DC | PRN

## 2024-05-13 MED ORDER — LIDOCAINE-EPINEPHRINE 1 %-1:100000 IJ SOLN
INTRAMUSCULAR | Status: AC
  Filled 2024-05-13: qty 1

## 2024-05-13 MED ORDER — BUPIVACAINE HCL (PF) 0.25 % IJ SOLN
INTRAMUSCULAR | Status: AC
  Filled 2024-05-13: qty 30

## 2024-05-13 MED ORDER — DEXAMETHASONE SODIUM PHOSPHATE 10 MG/ML IJ SOLN
INTRAMUSCULAR | Status: DC | PRN
  Administered 2024-05-13: 10 mg via INTRAVENOUS

## 2024-05-13 MED ORDER — ONDANSETRON HCL 4 MG/2ML IJ SOLN
INTRAMUSCULAR | Status: DC | PRN
  Administered 2024-05-13: 4 mg via INTRAVENOUS

## 2024-05-13 MED ORDER — ALUM & MAG HYDROXIDE-SIMETH 200-200-20 MG/5ML PO SUSP: 30.0000 mL | Freq: Four times a day (QID) | ORAL | Status: DC | PRN

## 2024-05-13 MED ORDER — MENTHOL 3 MG MT LOZG: 1.0000 | LOZENGE | OROMUCOSAL | Status: DC | PRN

## 2024-05-13 MED ORDER — NITROGLYCERIN 0.4 MG SL SUBL: 0.4000 mg | SUBLINGUAL_TABLET | SUBLINGUAL | Status: DC | PRN

## 2024-05-13 MED ORDER — MIDAZOLAM HCL 2 MG/2ML IJ SOLN
INTRAMUSCULAR | Status: DC | PRN
Start: 2024-05-13 — End: 2024-05-13
  Administered 2024-05-13: 2 mg via INTRAVENOUS

## 2024-05-13 MED ORDER — MORPHINE SULFATE 15 MG PO TABS
30.0000 mg | ORAL_TABLET | Freq: Four times a day (QID) | ORAL | Status: DC | PRN
  Administered 2024-05-13: 30 mg via ORAL
  Filled 2024-05-13: qty 2

## 2024-05-13 MED ORDER — BUPIVACAINE LIPOSOME 1.3 % IJ SUSP
INTRAMUSCULAR | Status: AC
  Filled 2024-05-13: qty 20

## 2024-05-13 MED ORDER — AMLODIPINE BESYLATE 5 MG PO TABS: 5.0000 mg | ORAL_TABLET | Freq: Every day | ORAL | Status: DC

## 2024-05-13 MED ORDER — ONDANSETRON HCL 4 MG/2ML IJ SOLN: 4.0000 mg | Freq: Once | INTRAMUSCULAR | Status: DC | PRN

## 2024-05-13 MED ORDER — AMITRIPTYLINE HCL 25 MG PO TABS
25.0000 mg | ORAL_TABLET | Freq: Every day | ORAL | Status: DC
  Filled 2024-05-13: qty 1

## 2024-05-13 MED ORDER — LIDOCAINE-EPINEPHRINE 1 %-1:100000 IJ SOLN
INTRAMUSCULAR | Status: DC | PRN
  Administered 2024-05-13: 10 mL

## 2024-05-13 MED ORDER — HYDROMORPHONE HCL 1 MG/ML IJ SOLN: 0.5000 mg | INTRAMUSCULAR | Status: DC | PRN

## 2024-05-13 MED ORDER — SODIUM CHLORIDE 0.9 % IV SOLN
250.0000 mL | INTRAVENOUS | Status: DC
Start: 2024-05-13 — End: 2024-05-14
  Administered 2024-05-13: 250 mL via INTRAVENOUS

## 2024-05-13 MED ORDER — ONDANSETRON HCL 4 MG/2ML IJ SOLN
INTRAMUSCULAR | Status: AC
  Filled 2024-05-13: qty 2

## 2024-05-13 MED ORDER — FENTANYL CITRATE (PF) 100 MCG/2ML IJ SOLN
25.0000 ug | INTRAMUSCULAR | Status: DC | PRN
  Administered 2024-05-13 (×3): 50 ug via INTRAVENOUS

## 2024-05-13 MED ORDER — DROPERIDOL 2.5 MG/ML IJ SOLN: 0.6250 mg | Freq: Once | INTRAMUSCULAR | Status: DC | PRN

## 2024-05-13 MED ORDER — LEVOTHYROXINE SODIUM 125 MCG PO TABS
125.0000 ug | ORAL_TABLET | Freq: Every day | ORAL | Status: DC
  Filled 2024-05-13: qty 1

## 2024-05-13 MED ORDER — PHENOL 1.4 % MT LIQD: 1.0000 | OROMUCOSAL | Status: DC | PRN

## 2024-05-13 MED ORDER — PANTOPRAZOLE SODIUM 40 MG PO TBEC
40.0000 mg | DELAYED_RELEASE_TABLET | Freq: Every day | ORAL | Status: DC
Start: 2024-05-14 — End: 2024-05-14

## 2024-05-13 MED ORDER — 0.9 % SODIUM CHLORIDE (POUR BTL) OPTIME
TOPICAL | Status: DC | PRN
  Administered 2024-05-13 (×2): 1000 mL

## 2024-05-13 MED ORDER — SUGAMMADEX SODIUM 200 MG/2ML IV SOLN
INTRAVENOUS | Status: DC | PRN
  Administered 2024-05-13: 270 mg via INTRAVENOUS

## 2024-05-13 MED ORDER — ACETAMINOPHEN 10 MG/ML IV SOLN
1000.0000 mg | Freq: Once | INTRAVENOUS | Status: DC | PRN
  Administered 2024-05-13: 1000 mg via INTRAVENOUS

## 2024-05-13 MED ORDER — FENTANYL CITRATE (PF) 250 MCG/5ML IJ SOLN
INTRAMUSCULAR | Status: DC | PRN
  Administered 2024-05-13 (×3): 50 ug via INTRAVENOUS

## 2024-05-13 MED ORDER — ROCURONIUM BROMIDE 10 MG/ML (PF) SYRINGE
PREFILLED_SYRINGE | INTRAVENOUS | Status: DC | PRN
Start: 2024-05-13 — End: 2024-05-13
  Administered 2024-05-13: 60 mg via INTRAVENOUS
  Administered 2024-05-13: 20 mg via INTRAVENOUS

## 2024-05-13 MED ORDER — OXYCODONE-ACETAMINOPHEN 10-325 MG PO TABS: 1.0000 | ORAL_TABLET | Freq: Four times a day (QID) | ORAL | 0 refills | Status: AC | PRN

## 2024-05-13 MED ORDER — LAMOTRIGINE 150 MG PO TABS
150.0000 mg | ORAL_TABLET | Freq: Two times a day (BID) | ORAL | Status: DC
  Filled 2024-05-13: qty 1

## 2024-05-13 MED ORDER — CEFAZOLIN SODIUM-DEXTROSE 2-4 GM/100ML-% IV SOLN
2.0000 g | Freq: Three times a day (TID) | INTRAVENOUS | Status: DC
  Administered 2024-05-13: 2 g via INTRAVENOUS
  Filled 2024-05-13: qty 100

## 2024-05-13 MED ORDER — ACETAMINOPHEN 10 MG/ML IV SOLN
INTRAVENOUS | Status: AC
  Filled 2024-05-13: qty 100

## 2024-05-13 MED ORDER — LACTATED RINGERS IV SOLN: INTRAVENOUS | Status: DC

## 2024-05-13 MED ORDER — ONDANSETRON HCL 4 MG PO TABS: 4.0000 mg | ORAL_TABLET | Freq: Four times a day (QID) | ORAL | Status: DC | PRN

## 2024-05-13 MED ORDER — ACETAMINOPHEN 650 MG RE SUPP: 650.0000 mg | RECTAL | Status: DC | PRN

## 2024-05-13 MED ORDER — KETAMINE HCL 50 MG/5ML IJ SOSY
PREFILLED_SYRINGE | INTRAMUSCULAR | Status: AC
  Filled 2024-05-13: qty 5

## 2024-05-13 MED ORDER — BUPIVACAINE LIPOSOME 1.3 % IJ SUSP
INTRAMUSCULAR | Status: DC | PRN
  Administered 2024-05-13: 20 mL

## 2024-05-13 MED ORDER — SODIUM CHLORIDE 0.9% FLUSH
3.0000 mL | Freq: Two times a day (BID) | INTRAVENOUS | Status: DC
  Administered 2024-05-13: 3 mL via INTRAVENOUS

## 2024-05-13 MED ORDER — PANTOPRAZOLE SODIUM 40 MG IV SOLR: 40.0000 mg | Freq: Every day | INTRAVENOUS | Status: DC

## 2024-05-13 MED ORDER — CEFAZOLIN SODIUM-DEXTROSE 2-4 GM/100ML-% IV SOLN
2.0000 g | INTRAVENOUS | Status: AC
Start: 2024-05-13 — End: 2024-05-13
  Administered 2024-05-13: 2 g via INTRAVENOUS
  Filled 2024-05-13: qty 100

## 2024-05-13 MED ORDER — ORAL CARE MOUTH RINSE: 15.0000 mL | Freq: Once | OROMUCOSAL | Status: DC

## 2024-05-13 SURGICAL SUPPLY — 61 items
BAG COUNTER SPONGE SURGICOUNT (BAG) ×2 IMPLANT
BENZOIN TINCTURE PRP APPL 2/3 (GAUZE/BANDAGES/DRESSINGS) ×2 IMPLANT
BLADE CLIPPER SURG (BLADE) IMPLANT
BLADE SURG 11 STRL SS (BLADE) ×2 IMPLANT
BUR MATCHSTICK NEURO 3.0 LAGG (BURR) ×2 IMPLANT
BUR PRECISION FLUTE 6.0 (BURR) ×2 IMPLANT
CANISTER SUCTION 3000ML PPV (SUCTIONS) ×2 IMPLANT
CNTNR URN SCR LID CUP LEK RST (MISCELLANEOUS) ×2 IMPLANT
COVER BACK TABLE 24X17X13 BIG (DRAPES) IMPLANT
COVER BACK TABLE 60X90IN (DRAPES) ×2 IMPLANT
DERMABOND ADVANCED .7 DNX12 (GAUZE/BANDAGES/DRESSINGS) ×2 IMPLANT
DRAPE C-ARM 42X72 X-RAY (DRAPES) ×4 IMPLANT
DRAPE HALF SHEET 40X57 (DRAPES) IMPLANT
DRAPE LAPAROTOMY 100X72X124 (DRAPES) ×2 IMPLANT
DRAPE POUCH INSTRU U-SHP 10X18 (DRAPES) ×2 IMPLANT
DRAPE SURG 17X23 STRL (DRAPES) ×2 IMPLANT
DRSG OPSITE POSTOP 4X8 (GAUZE/BANDAGES/DRESSINGS) IMPLANT
DURAPREP 26ML APPLICATOR (WOUND CARE) ×2 IMPLANT
ELECTRODE REM PT RTRN 9FT ADLT (ELECTROSURGICAL) ×2 IMPLANT
EVACUATOR 1/8 PVC DRAIN (DRAIN) ×2 IMPLANT
GAUZE 4X4 16PLY ~~LOC~~+RFID DBL (SPONGE) IMPLANT
GAUZE SPONGE 4X4 12PLY STRL (GAUZE/BANDAGES/DRESSINGS) ×2 IMPLANT
GLOVE BIO SURGEON STRL SZ7 (GLOVE) IMPLANT
GLOVE BIO SURGEON STRL SZ8 (GLOVE) ×4 IMPLANT
GLOVE BIOGEL PI IND STRL 7.0 (GLOVE) IMPLANT
GLOVE EXAM NITRILE XL STR (GLOVE) IMPLANT
GLOVE INDICATOR 8.5 STRL (GLOVE) ×8 IMPLANT
GOWN STRL REUS W/ TWL LRG LVL3 (GOWN DISPOSABLE) IMPLANT
GOWN STRL REUS W/ TWL XL LVL3 (GOWN DISPOSABLE) ×4 IMPLANT
GOWN STRL REUS W/TWL 2XL LVL3 (GOWN DISPOSABLE) IMPLANT
GRAFT BN 5X1XSPNE CVD POST DBM (Bone Implant) IMPLANT
GRAFT BNE MATRIX VG FRMBL L 10 (Bone Implant) IMPLANT
KIT BASIN OR (CUSTOM PROCEDURE TRAY) ×2 IMPLANT
KIT INFUSE SMALL (Orthopedic Implant) IMPLANT
KIT TURNOVER KIT B (KITS) ×2 IMPLANT
NDL HYPO 21X1.5 SAFETY (NEEDLE) ×2 IMPLANT
NDL HYPO 25X1 1.5 SAFETY (NEEDLE) ×2 IMPLANT
NEEDLE HYPO 21X1.5 SAFETY (NEEDLE) ×1 IMPLANT
NEEDLE HYPO 25X1 1.5 SAFETY (NEEDLE) ×1 IMPLANT
PACK LAMINECTOMY NEURO (CUSTOM PROCEDURE TRAY) ×2 IMPLANT
PAD ARMBOARD POSITIONER FOAM (MISCELLANEOUS) ×6 IMPLANT
ROD PREBENT 6.35X70 (Rod) IMPLANT
ROD PREBENT 6.35X80 (Rod) IMPLANT
SCREW PEDICLE VA L635 7.5X45M (Screw) IMPLANT
SCREW SET BREAK OFF (Screw) IMPLANT
SOLN 0.9% NACL 1000 ML (IV SOLUTION) ×1 IMPLANT
SOLN 0.9% NACL POUR BTL 1000ML (IV SOLUTION) ×2 IMPLANT
SOLN STERILE WATER 1000 ML (IV SOLUTION) ×1 IMPLANT
SOLN STERILE WATER BTL 1000 ML (IV SOLUTION) ×2 IMPLANT
SPIKE FLUID TRANSFER (MISCELLANEOUS) ×2 IMPLANT
SPONGE SURGIFOAM ABS GEL 100 (HEMOSTASIS) ×2 IMPLANT
SPONGE T-LAP 4X18 ~~LOC~~+RFID (SPONGE) IMPLANT
STRIP CLOSURE SKIN 1/2X4 (GAUZE/BANDAGES/DRESSINGS) ×4 IMPLANT
SUT VIC AB 0 CT1 18XCR BRD8 (SUTURE) ×4 IMPLANT
SUT VIC AB 2-0 CP2 18 (SUTURE) IMPLANT
SUT VIC AB 2-0 CT1 18 (SUTURE) ×2 IMPLANT
SUT VIC AB 4-0 PS2 27 (SUTURE) ×2 IMPLANT
SYR 20ML LL LF (SYRINGE) ×2 IMPLANT
TOWEL GREEN STERILE (TOWEL DISPOSABLE) ×2 IMPLANT
TOWEL GREEN STERILE FF (TOWEL DISPOSABLE) ×2 IMPLANT
TRAY FOLEY MTR SLVR 16FR STAT (SET/KITS/TRAYS/PACK) ×2 IMPLANT

## 2024-05-13 NOTE — Discharge Instructions (Signed)
   Wound Care Keep incision covered and dry until post op day 3. You may remove the Honeycomb dressing on post op day 3. Leave steri-strips on back.  They will fall off by themselves. Do not put any creams, lotions, or ointments on incision. You are fine to shower. Let water run over incision and pat dry.  Activity Walk each and every day, increasing distance each day. No lifting greater than 8 lbs.  Avoid excessive back motion. No driving, or riding a car unless coming back and forth to see the doctor  Diet Resume your normal diet.   Return to Work Will be discussed at your follow up appointment.  Call Your Doctor If Any of These Occur Redness, drainage, or swelling at the wound.  Temperature greater than 101 degrees. Severe pain not relieved by pain medication. Incision starts to come apart.  Follow Up Appt Call [phone] if you have one or any problem.

## 2024-05-13 NOTE — Plan of Care (Signed)
 Problem: Education: Goal: Knowledge of General Education information will improve Description: Including pain rating scale, medication(s)/side effects and non-pharmacologic comfort measures 05/13/2024 1929 by Sherleen Flor, RN Outcome: Completed/Met 05/13/2024 1249 by Sherleen Flor, RN Outcome: Progressing   Problem: Health Behavior/Discharge Planning: Goal: Ability to manage health-related needs will improve 05/13/2024 1929 by Sherleen Flor, RN Outcome: Completed/Met 05/13/2024 1249 by Sherleen Flor, RN Outcome: Progressing   Problem: Clinical Measurements: Goal: Ability to maintain clinical measurements within normal limits will improve 05/13/2024 1929 by Sherleen Flor, RN Outcome: Completed/Met 05/13/2024 1249 by Sherleen Flor, RN Outcome: Progressing Goal: Will remain free from infection 05/13/2024 1929 by Sherleen Flor, RN Outcome: Completed/Met 05/13/2024 1249 by Sherleen Flor, RN Outcome: Progressing Goal: Diagnostic test results will improve 05/13/2024 1929 by Sherleen Flor, RN Outcome: Completed/Met 05/13/2024 1249 by Sherleen Flor, RN Outcome: Progressing Goal: Respiratory complications will improve 05/13/2024 1929 by Sherleen Flor, RN Outcome: Completed/Met 05/13/2024 1249 by Sherleen Flor, RN Outcome: Progressing Goal: Cardiovascular complication will be avoided 05/13/2024 1929 by Sherleen Flor, RN Outcome: Completed/Met 05/13/2024 1249 by Sherleen Flor, RN Outcome: Progressing   Problem: Activity: Goal: Risk for activity intolerance will decrease 05/13/2024 1929 by Sherleen Flor, RN Outcome: Completed/Met 05/13/2024 1249 by Sherleen Flor, RN Outcome: Progressing   Problem: Nutrition: Goal: Adequate nutrition will be maintained 05/13/2024 1929 by Sherleen Flor, RN Outcome: Completed/Met 05/13/2024 1249 by Sherleen Flor, RN Outcome: Progressing   Problem: Coping: Goal: Level of anxiety will decrease 05/13/2024  1929 by Sherleen Flor, RN Outcome: Completed/Met 05/13/2024 1249 by Sherleen Flor, RN Outcome: Progressing   Problem: Elimination: Goal: Will not experience complications related to bowel motility 05/13/2024 1929 by Sherleen Flor, RN Outcome: Completed/Met 05/13/2024 1249 by Sherleen Flor, RN Outcome: Progressing Goal: Will not experience complications related to urinary retention 05/13/2024 1929 by Sherleen Flor, RN Outcome: Completed/Met 05/13/2024 1249 by Sherleen Flor, RN Outcome: Progressing   Problem: Pain Managment: Goal: General experience of comfort will improve and/or be controlled 05/13/2024 1929 by Sherleen Flor, RN Outcome: Completed/Met 05/13/2024 1249 by Sherleen Flor, RN Outcome: Progressing   Problem: Safety: Goal: Ability to remain free from injury will improve 05/13/2024 1929 by Sherleen Flor, RN Outcome: Completed/Met 05/13/2024 1249 by Sherleen Flor, RN Outcome: Progressing   Problem: Skin Integrity: Goal: Risk for impaired skin integrity will decrease 05/13/2024 1929 by Sherleen Flor, RN Outcome: Completed/Met 05/13/2024 1249 by Sherleen Flor, RN Outcome: Progressing   Problem: Education: Goal: Ability to verbalize activity precautions or restrictions will improve 05/13/2024 1929 by Sherleen Flor, RN Outcome: Completed/Met 05/13/2024 1249 by Sherleen Flor, RN Outcome: Progressing Goal: Knowledge of the prescribed therapeutic regimen will improve 05/13/2024 1929 by Sherleen Flor, RN Outcome: Completed/Met 05/13/2024 1249 by Sherleen Flor, RN Outcome: Progressing Goal: Understanding of discharge needs will improve 05/13/2024 1929 by Sherleen Flor, RN Outcome: Completed/Met 05/13/2024 1249 by Sherleen Flor, RN Outcome: Progressing   Problem: Activity: Goal: Ability to avoid complications of mobility impairment will improve 05/13/2024 1929 by Sherleen Flor, RN Outcome: Completed/Met 05/13/2024 1249 by  Sherleen Flor, RN Outcome: Progressing Goal: Ability to tolerate increased activity will improve 05/13/2024 1929 by Sherleen Flor, RN Outcome: Completed/Met 05/13/2024 1249 by Sherleen Flor, RN Outcome: Progressing Goal: Will remain free from falls 05/13/2024 1929 by Sherleen Flor, RN Outcome: Completed/Met 05/13/2024 1249 by Sherleen Flor, RN Outcome: Progressing   Problem: Bowel/Gastric: Goal: Gastrointestinal status for postoperative course will improve 05/13/2024 1929 by Sherleen Flor, RN Outcome: Completed/Met 05/13/2024 1249 by Sherleen Flor, RN Outcome: Progressing   Problem: Clinical Measurements: Goal: Ability to  maintain clinical measurements within normal limits will improve 05/13/2024 1929 by Sherleen Flor, RN Outcome: Completed/Met 05/13/2024 1249 by Sherleen Flor, RN Outcome: Progressing Goal: Postoperative complications will be avoided or minimized 05/13/2024 1929 by Sherleen Flor, RN Outcome: Completed/Met 05/13/2024 1249 by Sherleen Flor, RN Outcome: Progressing Goal: Diagnostic test results will improve 05/13/2024 1929 by Sherleen Flor, RN Outcome: Completed/Met 05/13/2024 1249 by Sherleen Flor, RN Outcome: Progressing   Problem: Pain Management: Goal: Pain level will decrease 05/13/2024 1929 by Sherleen Flor, RN Outcome: Completed/Met 05/13/2024 1249 by Sherleen Flor, RN Outcome: Progressing   Problem: Skin Integrity: Goal: Will show signs of wound healing 05/13/2024 1929 by Sherleen Flor, RN Outcome: Completed/Met 05/13/2024 1249 by Sherleen Flor, RN Outcome: Progressing   Problem: Health Behavior/Discharge Planning: Goal: Identification of resources available to assist in meeting health care needs will improve 05/13/2024 1929 by Sherleen Flor, RN Outcome: Completed/Met 05/13/2024 1249 by Sherleen Flor, RN Outcome: Progressing   Problem: Bladder/Genitourinary: Goal: Urinary functional status for  postoperative course will improve 05/13/2024 1929 by Sherleen Flor, RN Outcome: Completed/Met 05/13/2024 1249 by Sherleen Flor, RN Outcome: Progressing

## 2024-05-13 NOTE — Transfer of Care (Signed)
 Immediate Anesthesia Transfer of Care Note  Patient: Katie Gaines  Procedure(s) Performed: LAMINECTOMY WITH POSTERIOR LATERAL FUSION  ARTHRODESIS LUMBAR TWO-LUMBAR THREE REMOVAL OF MEDTRONIC HARDWARE EXTENSION OF FUSION LUMBAR ONE (Back)  Patient Location: PACU  Anesthesia Type:General  Level of Consciousness: awake, alert , oriented, and patient cooperative  Airway & Oxygen Therapy: Patient Spontanous Breathing and Patient connected to face mask oxygen  Post-op Assessment: Report given to RN and Post -op Vital signs reviewed and stable  Post vital signs: Reviewed and stable  Last Vitals:  Vitals Value Taken Time  BP 153/87 05/13/24 09:55  Temp    Pulse 81 05/13/24 09:59  Resp 11 05/13/24 09:59  SpO2 95 % 05/13/24 09:59  Vitals shown include unfiled device data.  Last Pain:  Vitals:   05/13/24 0550  TempSrc: Oral  PainSc: 7          Complications: No notable events documented.

## 2024-05-13 NOTE — Anesthesia Procedure Notes (Signed)
 Procedure Name: Intubation Date/Time: 05/13/2024 7:46 AM  Performed by: Genny Gun, CRNAPre-anesthesia Checklist: Patient identified, Emergency Drugs available, Suction available, Patient being monitored and Timeout performed Patient Re-evaluated:Patient Re-evaluated prior to induction Oxygen Delivery Method: Circle system utilized Preoxygenation: Pre-oxygenation with 100% oxygen Induction Type: IV induction Ventilation: Mask ventilation without difficulty and Oral airway inserted - appropriate to patient size Laryngoscope Size: Mac and 3 Grade View: Grade I Tube type: Oral Tube size: 7.0 mm Number of attempts: 1 Placement Confirmation: ETT inserted through vocal cords under direct vision, positive ETCO2 and breath sounds checked- equal and bilateral Secured at: 23 cm Tube secured with: Tape Dental Injury: Teeth and Oropharynx as per pre-operative assessment

## 2024-05-13 NOTE — Op Note (Signed)
 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  Postoperative diagnosis: Same.  Procedure: Exploration of fusion removal of hardware L2-3 with removal of bilateral loose L2 pedicle screws placement of 2 new L1 pedicle screws connecting it up with pre-existing screws at L3 utilizing the Medtronic Legacy pedicle screw system with the break off top loading caps and posterolateral arthrodesis L1-L2 3 utilizing locally harvested autograft mixed with Vivigen BMP..  Surgeon: Arley helling.  Assistant: Rockey Peru.  Anesthesia: General.  EBL: Minimal.  HPI: 68 year old female history of L2-3 fusion as well as L1-2 anterolateral fusion with lateral plate presented with increased back pain workup with CT lumbar showed loosening of the bilateral L2 screws consistent with pseudoarthrosis and incomplete interbody fusion at that level.  So due to the patient's progression of clinical syndrome imaging findings failed conservative treatment I recommended exploration of fusion move of hardware with bilateral L2 screws removed placement of new screws at L1 posterolateral arthrodesis L1-L3.  I extensively reviewed the risks and benefits of that operation with her as well as perioperative course expectations of outcome and alternatives to surgery and she understood and agreed to proceed forward.  Operative procedure: Patient was brought into the OR was [facility] general anesthesia positioned prone on the Wilson frame her back was prepped and draped in routine sterile fashion.  Her old incision was opened up and extended slightly cephalad scar tissue was dissected free expose the hardware at L2-3 and exposed the pedicle screw entry point at L1.  I exposed the TPs and lateral facet joints at L1 then I disconnected the hardware removed bilateral L2 screws and expose the TPs at L2 and L3 as well as the lateral facet joints.  I aggressively irrigated the wound and aggressively decorticated the TPs and lateral facet joints at L3 L2  and L1 packed and extensive mount of BMP and Vivigen posterolaterally at L1-2 and L2-3.  I then connected the rods tightened down the knots and torqued everything off placed a medium Hemovac drain injected Exparel  in the fascia and closed the wound in layers with interoperative Vicryl and a running 4 subcuticular.  Dermabond benzoin Steri-Strips and sterile dressing was applied patient to cover him in stable condition.  At the end the case all needle count sponge counts were correct.

## 2024-05-13 NOTE — H&P (Signed)
 Katie Gaines is an 68 y.o. female.   Chief Complaint: Back pain HPI: 68 year old female with longstanding back pain previous lumbar fusion L2-S1 presents with increased back pain workup revealed loosening of her L2 screws and pseudoarthrosis at L2-3.  Due to patient's progression of clinical syndrome imaging findings of a conservative treatment I recommended reexploration fusion removal of hardware L2-3 with extension new screws up at L1 and redo posterolateral arthrodesis at L2-3 and posterolateral arthrodesis at L1-L2.  I have extensively gone over the risks and benefits of that operation with her as well as perioperative course expectations of outcome and alternatives to surgery and she understood and agreed to proceed forward.  Past Medical History:  Diagnosis Date   Anemia    Anxiety disorder    Arthritis    right wrist/hand   Cancer (HCC)    Skin Cancer on left outer ear   Common bile duct stone    Graves disease    History of kidney stones    Hypertension    Hypothyroidism    Iron deficiency    Lymphedema of leg    bilateral    Past Surgical History:  Procedure Laterality Date   ANTERIOR HIP REVISION Left 03/06/2023   Procedure: LEFT HIP POLY-LINER AND HIP BALL REVISION;  Surgeon: Vernetta Lonni GRADE, MD;  Location: [facility];  Service: Orthopedics;  Laterality: Left;   ANTERIOR LAT LUMBAR FUSION N/A 08/26/2019   Procedure: ANTERIOR LATERAL LUMBAR INTERBODY FUSION, LUMBAR ONE LUMBAR TWO;  Surgeon: Onetha Kuba, MD;  Location: [facility] OR;  Service: Neurosurgery;  Laterality: N/A;  anterolateral   APPENDECTOMY     BACK SURGERY  1987-2007   X 10 Surgery   CHOLECYSTECTOMY     COLONOSCOPY     ENTEROSCOPY  11/10/2011   Procedure: ENTEROSCOPY;  Surgeon: Lamar JONETTA Aho, MD;  Location: [facility];  Service: Endoscopy;  Laterality: N/A;   ERCP  07/19/2011   Procedure: ENDOSCOPIC RETROGRADE CHOLANGIOPANCREATOGRAPHY (ERCP);  Surgeon: Lamar JONETTA Aho, MD;  Location: [facility];   Service: Endoscopy;  Laterality: N/A;   ESOPHAGOGASTRODUODENOSCOPY  09/2018   FRACTURE SURGERY Left 2018   left arm fracture   HOT HEMOSTASIS  11/10/2011   Procedure: HOT HEMOSTASIS (ARGON PLASMA COAGULATION/BICAP);  Surgeon: Lamar JONETTA Aho, MD;  Location: [facility];  Service: Endoscopy;  Laterality: N/A;   TOTAL HIP ARTHROPLASTY Left 06/25/2021   Procedure: LEFT TOTAL HIP ARTHROPLASTY ANTERIOR APPROACH;  Surgeon: Vernetta Lonni GRADE, MD;  Location: [facility];  Service: Orthopedics;  Laterality: Left;   TOTAL KNEE ARTHROPLASTY Right 04/26/2022   Procedure: RIGHT TOTAL KNEE ARTHROPLASTY;  Surgeon: Vernetta Lonni GRADE, MD;  Location: [facility] OR;  Service: Orthopedics;  Laterality: Right;    Family History  Problem Relation Age of Onset   Colon polyps Father    Diabetes Father    Brain cancer Mother    Colon cancer Neg Hx    Malignant hyperthermia Neg Hx    Social History:  reports that she quit smoking about 3 years ago. Her smoking use included cigarettes. She started smoking about 48 years ago. She has a 22.5 pack-year smoking history. She has never used smokeless tobacco. She reports current alcohol use. She reports that she does not use drugs.  Allergies: No Known Allergies  Medications Prior to Admission  Medication Sig Dispense Refill   amitriptyline  (ELAVIL ) 25 MG tablet Take 25 mg by mouth at bedtime.      amLODipine  (NORVASC ) 5 MG tablet Take 5 mg by mouth  daily.      atorvastatin  (LIPITOR) 10 MG tablet Take 10 mg by mouth daily.     gabapentin  (NEURONTIN ) 800 MG tablet Take 800 mg by mouth 4 (four) times daily.      lactulose (CHRONULAC) 10 GM/15ML solution Take 30 g by mouth daily as needed for mild constipation.     lamoTRIgine  (LAMICTAL ) 150 MG tablet Take 1 tablet (150 mg total) by mouth 2 (two) times daily. Please call us  at [phone] to make follow up visit for further refills. 180 tablet 3   morphine  (MSIR) 30 MG tablet Take 30 mg by mouth in the morning and at  bedtime.     omeprazole (PRILOSEC) 40 MG capsule Take 40 mg by mouth daily.     SYNTHROID  125 MCG tablet Take 125 mcg by mouth daily before breakfast.     nitroGLYCERIN  (NITROSTAT ) 0.4 MG SL tablet Place 0.4 mg under the tongue every 5 (five) minutes as needed (esophageal spasms).      No results found for this or any previous visit (from the past 48 hours). No results found.  Review of Systems  Musculoskeletal:  Positive for back pain.    Blood pressure (!) 154/85, pulse 65, temperature 98 F (36.7 C), temperature source Oral, resp. rate 18, height 5' 6 (1.676 m), weight 67.1 kg, SpO2 97%. Physical Exam HENT:     Head: Normocephalic.     Right Ear: Tympanic membrane normal.     Nose: Nose normal.  Cardiovascular:     Rate and Rhythm: Normal rate.     Pulses: Normal pulses.  Pulmonary:     Effort: Pulmonary effort is normal.  Musculoskeletal:        General: Normal range of motion.     Cervical back: Normal range of motion.  Skin:    General: Skin is warm.  Neurological:     Mental Status: She is alert.     Comments: Strength 5 out of 5 iliopsoas, quads, hamstrings, gastrosoleus tibialis, EHL.      Assessment/Plan 68 year old presents for removal of hardware redo posterolateral arthrodesis new screws at L1-L2  Arley SHAUNNA Helling, MD 05/13/2024, 7:20 AM

## 2024-05-13 NOTE — Anesthesia Postprocedure Evaluation (Signed)
 Anesthesia Post Note  Patient: SHERMAN LIPUMA  Procedure(s) Performed: LAMINECTOMY WITH POSTERIOR LATERAL FUSION  ARTHRODESIS LUMBAR TWO-LUMBAR THREE REMOVAL OF MEDTRONIC HARDWARE EXTENSION OF FUSION LUMBAR ONE (Back)     Patient location during evaluation: PACU Anesthesia Type: General Level of consciousness: awake Pain management: pain level controlled Vital Signs Assessment: post-procedure vital signs reviewed and stable Respiratory status: spontaneous breathing Cardiovascular status: blood pressure returned to baseline Postop Assessment: no apparent nausea or vomiting Anesthetic complications: no   No notable events documented.                Lauraine KATHEE [place]

## 2024-05-13 NOTE — Discharge Summary (Signed)
  Physician Discharge Summary  Patient ID: Katie Gaines MRN: [id number] DOB/AGE: 68/04/1956 68 y.o. Estimated body mass index is 23.89 kg/m as calculated from the following:   Height as of this encounter: 5' 6 (1.676 m).   Weight as of this encounter: 67.1 kg.   Admit date: 05/13/2024 Discharge date: 05/13/2024  Admission Diagnoses: L2-3 pseudoarthrosis  Discharge Diagnoses: Same Principal Problem:   Pseudoarthrosis of lumbar spine   Discharged Condition: good  Hospital Course: Patient was met in the hospital underwent exploration fusion move of hardware with placement of new L1 screws removal of old L2 screws tying into the pre-existing screws at L3 and redo posterolateral arthrodesis L1-2 and L2-3 postoperative patient did very well recovered in the floor on the floor was ambulating voiding spontaneously tolerating regular diet stable for discharge home.  Consults: Significant Diagnostic Studies: Treatments: Redo posterolateral arthrodesis with placement of new pedicle screws L1-L3 Discharge Exam: Blood pressure 126/72, pulse 63, temperature 98.1 F (36.7 C), temperature source Oral, resp. rate 20, height 5' 6 (1.676 m), weight 67.1 kg, SpO2 99%.  Awake alert and oriented strength 5 out of 5 incision clean dry and intact   Disposition: Home   Allergies as of 05/13/2024   No Known Allergies      Medication List     TAKE these medications    amitriptyline  25 MG tablet Commonly known as: ELAVIL  Take 25 mg by mouth at bedtime.   amLODipine  5 MG tablet Commonly known as: NORVASC  Take 5 mg by mouth daily.   atorvastatin  10 MG tablet Commonly known as: LIPITOR Take 10 mg by mouth daily.   gabapentin  800 MG tablet Commonly known as: NEURONTIN  Take 800 mg by mouth 4 (four) times daily.   lactulose 10 GM/15ML solution Commonly known as: CHRONULAC Take 30 g by mouth daily as needed for mild constipation.   lamoTRIgine  150 MG tablet Commonly known as:  LAMICTAL  Take 1 tablet (150 mg total) by mouth 2 (two) times daily. Please call us  at [phone] to make follow up visit for further refills.   morphine  30 MG tablet Commonly known as: MSIR Take 30 mg by mouth in the morning and at bedtime.   nitroGLYCERIN  0.4 MG SL tablet Commonly known as: NITROSTAT  Place 0.4 mg under the tongue every 5 (five) minutes as needed (esophageal spasms).   omeprazole 40 MG capsule Commonly known as: PRILOSEC Take 40 mg by mouth daily.   oxyCODONE -acetaminophen  10-325 MG tablet Commonly known as: Percocet Take 1 tablet by mouth every 6 (six) hours as needed for pain.   Synthroid  125 MCG tablet Generic drug: levothyroxine  Take 125 mcg by mouth daily before breakfast.         Signed: Arley SHAUNNA Helling 05/13/2024, 4:25 PM

## 2024-05-13 NOTE — Progress Notes (Signed)
 Patient alert and oriented, mae's well, voiding adequate amount of urine, swallowing without difficulty, no c/o pain at time of discharge. Patient discharged home with family. Script and discharged instructions given to patient. Patient and family stated understanding of instructions given. Patient has an appointment with Dr. Onetha In 2 weeks. Patient waiting for family for ride home

## 2024-05-13 NOTE — Plan of Care (Signed)

## 2024-05-14 ENCOUNTER — Encounter (HOSPITAL_COMMUNITY): Payer: Self-pay | Admitting: Neurosurgery

## 2024-06-10 ENCOUNTER — Encounter: Payer: Self-pay | Admitting: Radiology

## 2024-08-19 NOTE — Therapy (Incomplete)
 " OUTPATIENT PHYSICAL THERAPY THORACOLUMBAR EVALUATION   Patient Name: Katie Gaines MRN: [id number] DOB:11-24-1955, 69 y.o., female Today's Date: 08/19/2024  END OF SESSION:   Past Medical History:  Diagnosis Date   Anemia    Anxiety disorder    Arthritis    right wrist/hand   Cancer (HCC)    Skin Cancer on left outer ear   Common bile duct stone    Graves disease    History of kidney stones    Hypertension    Hypothyroidism    Iron deficiency    Lymphedema of leg    bilateral   Past Surgical History:  Procedure Laterality Date   ANTERIOR HIP REVISION Left 03/06/2023   Procedure: LEFT HIP POLY-LINER AND HIP BALL REVISION;  Surgeon: Vernetta Lonni GRADE, MD;  Location: [facility];  Service: Orthopedics;  Laterality: Left;   ANTERIOR LAT LUMBAR FUSION N/A 08/26/2019   Procedure: ANTERIOR LATERAL LUMBAR INTERBODY FUSION, LUMBAR ONE LUMBAR TWO;  Surgeon: Onetha Kuba, MD;  Location: [facility] OR;  Service: Neurosurgery;  Laterality: N/A;  anterolateral   APPENDECTOMY     BACK SURGERY  1987-2007   X 10 Surgery   CHOLECYSTECTOMY     COLONOSCOPY     ENTEROSCOPY  11/10/2011   Procedure: ENTEROSCOPY;  Surgeon: Lamar JONETTA Aho, MD;  Location: [facility];  Service: Endoscopy;  Laterality: N/A;   ERCP  07/19/2011   Procedure: ENDOSCOPIC RETROGRADE CHOLANGIOPANCREATOGRAPHY (ERCP);  Surgeon: Lamar JONETTA Aho, MD;  Location: [facility];  Service: Endoscopy;  Laterality: N/A;   ESOPHAGOGASTRODUODENOSCOPY  09/2018   FRACTURE SURGERY Left 2018   left arm fracture   HOT HEMOSTASIS  11/10/2011   Procedure: HOT HEMOSTASIS (ARGON PLASMA COAGULATION/BICAP);  Surgeon: Lamar JONETTA Aho, MD;  Location: [facility];  Service: Endoscopy;  Laterality: N/A;   LAMINECTOMY WITH POSTERIOR LATERAL ARTHRODESIS LEVEL 2 N/A 05/13/2024   Procedure: LAMINECTOMY WITH POSTERIOR LATERAL FUSION  ARTHRODESIS LUMBAR TWO-LUMBAR THREE REMOVAL OF MEDTRONIC HARDWARE EXTENSION OF FUSION LUMBAR ONE;  Surgeon: Onetha Kuba, MD;   Location: 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 02/23/2023   Arthritis of knee, right 04/26/2022   Status post total right knee replacement 04/26/2022   Status post left hip replacement 06/25/2021   Fusion of spine of lumbar region 03/17/2021   Common peroneal neuropathy, left 06/02/2020   Dysesthesia 03/16/2020   Numbness 03/16/2020   Visual changes 03/16/2020   History of fusion of cervical spine 03/16/2020   History of fusion of lumbar spine 03/16/2020   Fatigue 11/29/2019   DDD (degenerative disc disease), lumbar 08/26/2019   Gastroesophageal reflux disease 06/17/2019   Vitamin D  deficiency 06/17/2019   Irritable bowel syndrome with diarrhea 03/06/2019   Closed fracture of distal end of radius 10/04/2018   Injury of right wrist 09/13/2018   Low back pain 07/15/2016   Pain syndrome, chronic 09/09/2012   Postlaminectomy syndrome, not elsewhere classified 09/09/2012   Anemia  05/30/2012   Essential hypertension 02/13/2012   Chronic gastrointestinal bleeding 01/04/2012   Hypothyroidism 01/04/2012   Obstruction of bile duct (HCC) 07/19/2011   Abnormal radiographic examination 07/19/2011   IRON DEFICIENCY 09/10/2009    PCP: Massie Sewer, DO  REFERRING PROVIDER: Arley Helling, MD   REFERRING DIAG: S32.009K (ICD-10-CM) - Unspecified fracture of unspecified lumbar vertebra, subsequent encounter for  fracture with nonunion   Rationale for Evaluation and Treatment: Rehabilitation  THERAPY DIAG:  No diagnosis found.  ONSET DATE: ***  SUBJECTIVE:                                                                                                                                                                                           SUBJECTIVE STATEMENT: ***  PERTINENT HISTORY:  ***  PAIN:  Are you having pain? Yes: NPRS scale: *** Pain location: *** Pain description: *** Aggravating factors: *** Relieving factors: ***  PRECAUTIONS: {Therapy precautions:24002}  RED FLAGS: {PT Red Flags:29287}   WEIGHT BEARING RESTRICTIONS: {Yes ***/No:24003}  FALLS:  Has patient fallen in last 6 months? {fallsyesno:27318}  LIVING ENVIRONMENT: Lives with: {[facility] lives with:25569::lives with their family} Lives in: {Lives in:25570} Stairs: {opstairs:27293} Has following equipment at home: {Assistive devices:23999}  OCCUPATION: ***  PLOF: {PLOF:24004}  PATIENT GOALS: ***  NEXT MD VISIT: ***  OBJECTIVE:  Note: Objective measures were completed at Evaluation unless otherwise noted.  DIAGNOSTIC FINDINGS:  ***  PATIENT SURVEYS:  PSFS: THE PATIENT SPECIFIC FUNCTIONAL SCALE  Place score of 0-10 (0 = unable to perform activity and 10 = able to perform activity at the same level as before injury or problem)  Activity Date: 08/20/2024         2.     3.     4.      Total Score ***      Total Score = Sum of activity scores/number of activities  Minimally Detectable Change: 3 points (for single activity); 2 points (for average score)  [facility] (nd). The Patient Specific Functional Scale . Retrieved from [email or web address]   COGNITION: Overall cognitive status: {cognition:24006}     SENSATION: {sensation:27233}  MUSCLE LENGTH: Hamstrings: Right *** deg; Left *** deg Debby test: Right *** deg; Left ***  deg  POSTURE: {posture:25561}  PALPATION: ***  LUMBAR ROM:   AROM Eval 08/20/2024  Flexion   Extension   Right lateral flexion   Left lateral flexion   Right rotation   Left rotation    (Blank rows = not tested)  LOWER EXTREMITY ROM:     {AROM/PROM:27142}  Right Eval 08/20/2024 Left Eval 08/20/2024  Hip flexion  Hip extension    Hip abduction    Hip adduction    Hip internal rotation    Hip external rotation    Knee flexion    Knee extension    Ankle dorsiflexion    Ankle plantarflexion    Ankle inversion    Ankle eversion     (Blank rows = not tested)  LOWER EXTREMITY MMT:    MMT Right Eval 08/20/2024 Left Eval 08/20/2024  Hip flexion    Hip extension    Hip abduction    Hip adduction    Hip internal rotation    Hip external rotation    Knee flexion    Knee extension    Ankle dorsiflexion    Ankle plantarflexion    Ankle inversion    Ankle eversion     (Blank rows = not tested)  LUMBAR SPECIAL TESTS:  {lumbar special test:25242}  FUNCTIONAL TESTS:  {Functional tests:24029}  GAIT: Distance walked: *** Assistive device utilized: {Assistive devices:23999} Level of assistance: {Levels of assistance:24026} Comments: ***  TREATMENT DATE:  08/20/2024 TherEx:  HEP handout provided with patient performing one set of each activity for appropriate form. Verbal and tactile cues provided.   Self-Care:  POC, ***                                                                                                                                 PATIENT EDUCATION:  Education details: *** Person educated: {Person educated:25204} Education method: {Education Method:25205} Education comprehension: {Education Comprehension:25206}  HOME EXERCISE PROGRAM: ***  ASSESSMENT:  CLINICAL IMPRESSION: Patient is a 69 y.o. F who was seen today for physical therapy evaluation and treatment for ***. Patient will benefit from skilled PT to address above noted  deficits.   OBJECTIVE IMPAIRMENTS: {opptimpairments:25111}.   ACTIVITY LIMITATIONS: {activitylimitations:27494}  PARTICIPATION LIMITATIONS: {participationrestrictions:25113}  PERSONAL FACTORS: {Personal factors:25162} are also affecting patient's functional outcome.   REHAB POTENTIAL: {rehabpotential:25112}  CLINICAL DECISION MAKING: {clinical decision making:25114}  EVALUATION COMPLEXITY: {Evaluation complexity:25115}   GOALS: Goals reviewed with patient? Yes  SHORT TERM GOALS: Target date: ***  Patient will show compliance with initial HEP. Baseline: Goal status: INITIAL  2.  Patient will report pain levels no greater than ***/10 in order to show an improved overall quality of life. Baseline:  Goal status: INITIAL  3.  *** Baseline:  Goal status: INITIAL  4.  *** Baseline:  Goal status: INITIAL  5.  *** Baseline:  Goal status: INITIAL  6.  *** Baseline:  Goal status: INITIAL  LONG TERM GOALS: Target date: ***  Patient will be independent with final HEP in order to maintain and progress upon functional gains made within PT. Baseline:  Goal status: INITIAL  2.  Patient will report pain levels no greater than ***/10 in order to show an improved overall quality of life. Baseline:  Goal status: INITIAL  3.  Patient will increase PSFS to at least *** in order  to show a significant improvement in subjective disability rating.  Baseline:  Goal status: INITIAL  4.  *** Baseline:  Goal status: INITIAL  5.  *** Baseline:  Goal status: INITIAL  6.  *** Baseline:  Goal status: INITIAL  PLAN:  PT FREQUENCY: {rehab frequency:25116}  PT DURATION: {rehab duration:25117}  PLANNED INTERVENTIONS: 97164- PT Re-evaluation, 97750- Physical Performance Testing, 97110-Therapeutic exercises, 97530- Therapeutic activity, [id number]- Neuromuscular re-education, 97535- Self Care, 02859- Manual therapy, [id number]- Gait training, [id number]- Orthotic Initial, [id number]-  Orthotic/Prosthetic subsequent, [id number]- Canalith repositioning, [id number]- Aquatic Therapy, [email or web address]- Electrical stimulation (unattended), [phone]- Electrical stimulation (manual), [id number]- Vasopneumatic device, [id number]- Ultrasound, [id number]- Traction (mechanical), [id number]- Ionotophoresis 4mg /ml Dexamethasone , 79439 (1-2 muscles), 20561 (3+ muscles)- Dry Needling, Patient/Family education, Balance training, Stair training, Taping, Joint mobilization, Joint manipulation, Spinal mobilization, Vestibular training, DME instructions, Cryotherapy, and Moist heat.  PLAN FOR NEXT SESSION: PIERRETTE Susannah Daring, PT, DPT 08/19/2024 10:12 AM    "

## 2024-08-20 ENCOUNTER — Ambulatory Visit (INDEPENDENT_AMBULATORY_CARE_PROVIDER_SITE_OTHER)

## 2024-08-20 DIAGNOSIS — M6281 Muscle weakness (generalized): Secondary | ICD-10-CM

## 2024-08-20 DIAGNOSIS — R2689 Other abnormalities of gait and mobility: Secondary | ICD-10-CM | POA: Diagnosis not present

## 2024-08-20 DIAGNOSIS — R2681 Unsteadiness on feet: Secondary | ICD-10-CM

## 2024-08-20 DIAGNOSIS — M5459 Other low back pain: Secondary | ICD-10-CM | POA: Diagnosis not present

## 2024-08-20 NOTE — Therapy (Signed)
 " OUTPATIENT PHYSICAL THERAPY THORACOLUMBAR RECERTIFICATION   Patient Name: Katie Gaines MRN: [id number] DOB:1956/05/11, 69 y.o., female Today's Date: 08/20/2024  END OF SESSION:  PT End of Session - 08/20/24 1301     Visit Number 2    Number of Visits 18    Date for Recertification  10/15/24    Authorization Type MEDICARE (PAPER REFERRAL)    Progress Note Due on Visit 10    PT Start Time 1303    PT Stop Time 1347    PT Time Calculation (min) 44 min    Activity Tolerance Patient limited by pain    Behavior During Therapy WFL for tasks assessed/performed           Past Medical History:  Diagnosis Date   Anemia    Anxiety disorder    Arthritis    right wrist/hand   Cancer (HCC)    Skin Cancer on left outer ear   Common bile duct stone    Graves disease    History of kidney stones    Hypertension    Hypothyroidism    Iron deficiency    Lymphedema of leg    bilateral   Past Surgical History:  Procedure Laterality Date   ANTERIOR HIP REVISION Left 03/06/2023   Procedure: LEFT HIP POLY-LINER AND HIP BALL REVISION;  Surgeon: Vernetta Lonni GRADE, MD;  Location: [facility];  Service: Orthopedics;  Laterality: Left;   ANTERIOR LAT LUMBAR FUSION N/A 08/26/2019   Procedure: ANTERIOR LATERAL LUMBAR INTERBODY FUSION, LUMBAR ONE LUMBAR TWO;  Surgeon: Onetha Kuba, MD;  Location: [facility] OR;  Service: Neurosurgery;  Laterality: N/A;  anterolateral   APPENDECTOMY     BACK SURGERY  1987-2007   X 10 Surgery   CHOLECYSTECTOMY     COLONOSCOPY     ENTEROSCOPY  11/10/2011   Procedure: ENTEROSCOPY;  Surgeon: Lamar JONETTA Aho, MD;  Location: [facility];  Service: Endoscopy;  Laterality: N/A;   ERCP  07/19/2011   Procedure: ENDOSCOPIC RETROGRADE CHOLANGIOPANCREATOGRAPHY (ERCP);  Surgeon: Lamar JONETTA Aho, MD;  Location: [facility];  Service: Endoscopy;  Laterality: N/A;   ESOPHAGOGASTRODUODENOSCOPY  09/2018   FRACTURE SURGERY Left 2018   left arm fracture   HOT HEMOSTASIS  11/10/2011    Procedure: HOT HEMOSTASIS (ARGON PLASMA COAGULATION/BICAP);  Surgeon: Lamar JONETTA Aho, MD;  Location: [facility];  Service: Endoscopy;  Laterality: N/A;   LAMINECTOMY WITH POSTERIOR LATERAL ARTHRODESIS LEVEL 2 N/A 05/13/2024   Procedure: LAMINECTOMY WITH POSTERIOR LATERAL FUSION  ARTHRODESIS LUMBAR TWO-LUMBAR THREE REMOVAL OF MEDTRONIC HARDWARE EXTENSION OF FUSION LUMBAR ONE;  Surgeon: Onetha Kuba, MD;  Location: [facility] OR;  Service: Neurosurgery;  Laterality: N/A;   TOTAL HIP ARTHROPLASTY Left 06/25/2021   Procedure: LEFT TOTAL HIP ARTHROPLASTY ANTERIOR APPROACH;  Surgeon: Vernetta Lonni GRADE, MD;  Location: [facility];  Service: Orthopedics;  Laterality: Left;   TOTAL KNEE ARTHROPLASTY Right 04/26/2022   Procedure: RIGHT TOTAL KNEE ARTHROPLASTY;  Surgeon: Vernetta Lonni GRADE, MD;  Location: [facility];  Service: Orthopedics;  Laterality: Right;   Patient Active Problem List   Diagnosis Date Noted   Pseudoarthrosis of lumbar spine 05/13/2024   Status post revision of total hip 03/06/2023   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 02/23/2023   Arthritis of knee, right 04/26/2022   Status post total right knee replacement 04/26/2022   Status post left hip replacement 06/25/2021   Fusion of spine of lumbar region 03/17/2021   Common peroneal neuropathy, left 06/02/2020   Dysesthesia 03/16/2020  Numbness 03/16/2020   Visual changes 03/16/2020   History of fusion of cervical spine 03/16/2020   History of fusion of lumbar spine 03/16/2020   Fatigue 11/29/2019   DDD (degenerative disc disease), lumbar 08/26/2019   Gastroesophageal reflux disease 06/17/2019   Vitamin D  deficiency 06/17/2019   Irritable bowel syndrome with diarrhea 03/06/2019   Closed fracture of distal end of radius 10/04/2018   Injury of right wrist 09/13/2018   Low back pain 07/15/2016   Pain syndrome, chronic 09/09/2012   Postlaminectomy syndrome, not elsewhere classified 09/09/2012   Anemia 05/30/2012   Essential  hypertension 02/13/2012   Chronic gastrointestinal bleeding 01/04/2012   Hypothyroidism 01/04/2012   Obstruction of bile duct (HCC) 07/19/2011   Abnormal radiographic examination 07/19/2011   IRON DEFICIENCY 09/10/2009    PCP: Massie Rosalva Sewer   REFERRING PROVIDER: Arley SHAUNNA Onetha Mickey, MD   REFERRING DIAG: [phone] (ICD-10-CM) - Unspecified fracture of unspecified lumbar vertebra, subsequent encounter for fracture with nonunion   Rationale for Evaluation and Treatment: Rehabilitation  THERAPY DIAG:  No diagnosis found.  ONSET DATE: 05/13/2024 laminectomy and PLIF (L2-3)  SUBJECTIVE:                                                                                                                                                                                           SUBJECTIVE STATEMENT: Patient reports being bad yesterday and was in the attic and overdid it. Patient endorses this is the best she has done following protocol since her previous surgery.   PERTINENT HISTORY:  Patient is now s/p L2-3 laminectomy and PLIF that is cleared from protocols/precautions. This is her 14th spinal surgery. Did not wear back brace until week 4-5. Still having some original leg pain.   Patient has had 13 spinal surgeries, Lt hip replacement with a revision, and Rt knee replacement. Patient has residual deficits in Lt LE sensation leading to diminished light touch which she is now having in the Rt LE. Patient had a fall on June 3rd where she landed on her buttocks which caused slight low back pain, but then was helping a friend out of a lawn chair where the friend was dead weight which is the cause of her significant low back pain. Patient has had a MRI as well as CT which she has a follow up for March 28, 2024. Patient states I received the CT results on MyChart and it said '4mm pedicle screws loosening at bilat L2'. Patient endorses pain in Rt groin and quadriceps with n/t down entire Rt LE.    PAIN:  Are you having pain? Yes: NPRS scale: 6-7/10  Pain location: low back and radiates to bilat LEs  Pain description: n/t, dull, achey, throbbing  Aggravating factors: sitting causes most pain, walking for prolonged periods of time  Relieving factors: compression stockings, elevation of LEs, time  PRECAUTIONS: None  RED FLAGS: None   WEIGHT BEARING RESTRICTIONS: No  FALLS:  Has patient fallen in last 6 months? No  LIVING ENVIRONMENT: Lives with: lives with their spouse Lives in: House/apartment Stairs: Yes: External: 2 steps; on right going up Has following equipment at home: Single point cane, Quad cane small base, Walker - 2 wheeled, Environmental Consultant - 4 wheeled, Crutches, and Wheelchair (manual)  OCCUPATION:   PLOF: Independent  PATIENT GOALS: to be awesome  NEXT MD VISIT: around October 01, 2024  OBJECTIVE:  Note: Objective measures were completed at Evaluation unless otherwise noted.  DIAGNOSTIC FINDINGS:  MRI scan lumbar spine overall looks good no disc herniation, no adjacent level disease. There is some edema in the L2 vertebral body.     PATIENT SURVEYS:  PSFS: THE PATIENT SPECIFIC FUNCTIONAL SCALE  Place score of 0-10 (0 = unable to perform activity and 10 = able to perform activity at the same level as before injury or problem)  Activity (updated) Date: 03/25/2024 08/20/2024   Gardening   5 5   2. Walking for prolonged periods   1 5   3. Sitting   5 4   4. Lifting heavier weights  6   Total Score 3.66 5     Total Score = Sum of activity scores/number of activities  Minimally Detectable Change: 3 points (for single activity); 2 points (for average score)  [facility] (nd). The Patient Specific Functional Scale . Retrieved from [email or web address]   COGNITION: Overall cognitive status: Within functional limits for tasks assessed     SENSATION: Light touch: diminished compared to  normal   MUSCLE LENGTH: Not tested on eval   POSTURE: rounded shoulders, forward head, and increased thoracic kyphosis  PALPATION: Not assessed on eval   LUMBAR ROM:   AROM Eval 03/25/2024  Flexion WFL, but painful  Extension 5% and highly painful  Right lateral flexion 5% and highly painful  Left lateral flexion 5% and highly painful  Right rotation Unable   Left rotation Unable    (Blank rows = not tested)  LOWER EXTREMITY ROM:     ROM Right Eval 03/25/2024 Left Eval 03/25/2024  Hip flexion    Hip extension    Hip abduction    Hip adduction    Hip internal rotation    Hip external rotation    Knee flexion    Knee extension    Ankle dorsiflexion    Ankle plantarflexion    Ankle inversion    Ankle eversion     (Blank rows = not tested)  LOWER EXTREMITY MMT:    MMT Right Eval 03/25/2024 Left Eval 03/25/2024 Rt 08/20/2024 Lt  08/20/2024  Hip flexion (seated) 3/5 3/5 3+/5 3+/5  Hip extension 2+/5 Unable to test     Hip abduction (seated) 2+/5 Unable to test  4/5 4/5  Hip adduction      Hip internal rotation      Hip external rotation      Knee flexion (seated) 5/5 5/5 4/5 4/5  Knee extension (seated) 5/5 5/5 5/5 5/5  Ankle dorsiflexion (seated)      Ankle plantarflexion 4/5 3+/5 4/5 3+/5  Ankle inversion      Ankle eversion       (Blank  rows = not tested)  LUMBAR SPECIAL TESTS:  Not assessed on eval   FUNCTIONAL TESTS:  Not assessed on eval   GAIT: Distance walked: not formally assessed  Assistive device utilized: None Level of assistance: supervision  Comments: wide BOS, antalgic gait pattern, bilat short steps  TREATMENT DATE:  08/20/2024 TherEx:  Nustep level 3 for 7 minutes Mini squat with counter support 1x10  Standing hip abduction 1x10 each side  Standing hip extension 1x10 each side  Seated SLR 1x10 each side   Self-Care:  PT discussed POC    03/25/2024 TherEx:  HEP handout provided with patient performing one set of each  activity for appropriate form. Verbal and tactile cues required.                                                                                                                                   PATIENT EDUCATION:  Education details: HEP, POC Person educated: Patient Education method: Explanation, Demonstration, Tactile cues, Verbal cues, and Handouts Education comprehension: verbalized understanding, returned demonstration, verbal cues required, and tactile cues required  HOME EXERCISE PROGRAM: Access Code: [email or web address] URL: [email or web address] Date: 08/20/2024 Prepared by: Susannah Daring  Exercises - Mini Squat with Counter Support  - 1 x daily - 7 x weekly - 2 sets - 10 reps - Standing Hip Abduction with Counter Support  - 1 x daily - 7 x weekly - 2 sets - 10 reps - Standing Hip Extension with Counter Support  - 1 x daily - 7 x weekly - 2 sets - 10 reps - Seated Active Straight-Leg Raise  - 1 x daily - 7 x weekly - 2 sets - 10 reps  ASSESSMENT:  CLINICAL IMPRESSION: Patient arrived to session *** Patient will continue to benefit from skilled PT.   OBJECTIVE IMPAIRMENTS: Abnormal gait, decreased activity tolerance, decreased balance, decreased mobility, difficulty walking, decreased ROM, decreased strength, hypomobility, impaired sensation, improper body mechanics, postural dysfunction, and pain.   ACTIVITY LIMITATIONS: lifting, bending, sitting, standing, squatting, sleeping, stairs, and bed mobility  PARTICIPATION LIMITATIONS: driving, shopping, community activity, and yard work  PERSONAL FACTORS: Past/current experiences, Time since onset of injury/illness/exacerbation, and 3+ comorbidities: anemia, anxiety, HTN, thyroid disease, Hx of cancer are also affecting patient's functional outcome.   REHAB POTENTIAL: Good  CLINICAL DECISION MAKING: Evolving/moderate complexity  EVALUATION COMPLEXITY: Moderate   GOALS: Goals reviewed with patient? Yes  SHORT TERM  GOALS: Target date: 09/10/2024  Patient will be compliant with initial HEP.  Baseline: Goal status: INITIAL  2.  Patient will report pain levels no greater than 5/10 to show improvement in overall quality of life. Baseline:  Goal status: INITIAL     LONG TERM GOALS: Target date: 10/15/2024  Patient will be independent with final HEP in order to maintain and progress upon functional gains made within PT. Baseline:  Goal status: INITIAL  2.  Patient will report pain levels no greater than 3/10  in order to show improvement in overall quality of life. Baseline:  Goal status: INITIAL  3.  Patient will increase PSFS to at least 7 to show significant improvement in subjective disability rating. Baseline:  Goal status: INITIAL  4.  Patient will increase lumbar extension ROM to at least 50% in order to show improvement in functional mobility. Baseline:  Goal status: INITIAL  5.  Patient will increase hip extension MMT to at least 4/5 in order to show improvement in biomechanics with functional mobility. Baseline:  Goal status: INITIAL  PLAN:  PT FREQUENCY: 1x/week  PT DURATION: 8 weeks  PLANNED INTERVENTIONS: 97164- PT Re-evaluation, 97750- Physical Performance Testing, 97110-Therapeutic exercises, 97530- Therapeutic activity, [id number]- Neuromuscular re-education, 97535- Self Care, 02859- Manual therapy, [phone]- Gait training, [phone]- Electrical stimulation (unattended), [phone]- Electrical stimulation (manual), [id number]- Vasopneumatic device, [id number]- Ultrasound, [id number]- Traction (mechanical), [id number]- Ionotophoresis 4mg /ml Dexamethasone , 79439 (1-2 muscles), 20561 (3+ muscles)- Dry Needling, Patient/Family education, Balance training, Stair training, Taping, Joint mobilization, Joint manipulation, Spinal manipulation, Spinal mobilization, Scar mobilization, DME instructions, Cryotherapy, and Moist heat.  PLAN FOR NEXT SESSION: *** assess HEP, lumbar ROM, hip musculature strengthening, core  strengthening   Susannah Daring, PT, DPT 08/20/2024 3:34 PM    "

## 2024-08-26 NOTE — Therapy (Signed)
 " OUTPATIENT PHYSICAL THERAPY THORACOLUMBAR TREATMENT   Patient Name: Katie Gaines MRN: [id number] DOB:[birth year], 69 y.o., female Today's Date: 08/27/2024  END OF SESSION:  PT End of Session - 08/27/24 1328     Visit Number 3    Number of Visits 18    Date for Recertification  10/15/24    Authorization Type MEDICARE (PAPER REFERRAL)    PT Start Time 1300    PT Stop Time 1338    PT Time Calculation (min) 38 min    Activity Tolerance Patient tolerated treatment well    Behavior During Therapy WFL for tasks assessed/performed            Past Medical History:  Diagnosis Date   Anemia    Anxiety disorder    Arthritis    right wrist/hand   Cancer (HCC)    Skin Cancer on left outer ear   Common bile duct stone    Graves disease    History of kidney stones    Hypertension    Hypothyroidism    Iron deficiency    Lymphedema of leg    bilateral   Past Surgical History:  Procedure Laterality Date   ANTERIOR HIP REVISION Left 03/06/2023   Procedure: LEFT HIP POLY-LINER AND HIP BALL REVISION;  Surgeon: Vernetta Lonni GRADE, MD;  Location: [facility];  Service: Orthopedics;  Laterality: Left;   ANTERIOR LAT LUMBAR FUSION N/A 08/26/2019   Procedure: ANTERIOR LATERAL LUMBAR INTERBODY FUSION, LUMBAR ONE LUMBAR TWO;  Surgeon: Onetha Kuba, MD;  Location: [facility];  Service: Neurosurgery;  Laterality: N/A;  anterolateral   APPENDECTOMY     BACK SURGERY  1987-2007   X 10 Surgery   CHOLECYSTECTOMY     COLONOSCOPY     ENTEROSCOPY  11/10/2011   Procedure: ENTEROSCOPY;  Surgeon: Lamar JONETTA Aho, MD;  Location: [facility];  Service: Endoscopy;  Laterality: N/A;   ERCP  07/19/2011   Procedure: ENDOSCOPIC RETROGRADE CHOLANGIOPANCREATOGRAPHY (ERCP);  Surgeon: Lamar JONETTA Aho, MD;  Location: [facility];  Service: Endoscopy;  Laterality: N/A;   ESOPHAGOGASTRODUODENOSCOPY  09/2018   FRACTURE SURGERY Left 2018   left arm fracture   HOT HEMOSTASIS  11/10/2011   Procedure: HOT HEMOSTASIS  (ARGON PLASMA COAGULATION/BICAP);  Surgeon: Lamar JONETTA Aho, MD;  Location: [facility];  Service: Endoscopy;  Laterality: N/A;   LAMINECTOMY WITH POSTERIOR LATERAL ARTHRODESIS LEVEL 2 N/A 05/13/2024   Procedure: LAMINECTOMY WITH POSTERIOR LATERAL FUSION  ARTHRODESIS LUMBAR TWO-LUMBAR THREE REMOVAL OF [employer] HARDWARE EXTENSION OF FUSION LUMBAR ONE;  Surgeon: Onetha Kuba, MD;  Location: [facility] OR;  Service: Neurosurgery;  Laterality: N/A;   TOTAL HIP ARTHROPLASTY Left 06/25/2021   Procedure: LEFT TOTAL HIP ARTHROPLASTY ANTERIOR APPROACH;  Surgeon: Vernetta Lonni GRADE, MD;  Location: [facility];  Service: Orthopedics;  Laterality: Left;   TOTAL KNEE ARTHROPLASTY Right 04/26/2022   Procedure: RIGHT TOTAL KNEE ARTHROPLASTY;  Surgeon: Vernetta Lonni GRADE, MD;  Location: [facility];  Service: Orthopedics;  Laterality: Right;   Patient Active Problem List   Diagnosis Date Noted   Pseudoarthrosis of lumbar spine 05/13/2024   Status post revision of total hip 03/06/2023   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 02/23/2023   Arthritis of knee, right 04/26/2022   Status post total right knee replacement 04/26/2022   Status post left hip replacement 06/25/2021   Fusion of spine of lumbar region 03/17/2021   Common peroneal neuropathy, left 06/02/2020   Dysesthesia 03/16/2020   Numbness 03/16/2020   Visual changes 03/16/2020  History of fusion of cervical spine 03/16/2020   History of fusion of lumbar spine 03/16/2020   Fatigue 11/29/2019   DDD (degenerative disc disease), lumbar 08/26/2019   Gastroesophageal reflux disease 06/17/2019   Vitamin D  deficiency 06/17/2019   Irritable bowel syndrome with diarrhea 03/06/2019   Closed fracture of distal end of radius 10/04/2018   Injury of right wrist 09/13/2018   Low back pain 07/15/2016   Pain syndrome, chronic 09/09/2012   Postlaminectomy syndrome, not elsewhere classified 09/09/2012   Anemia 05/30/2012   Essential hypertension 02/13/2012    Chronic gastrointestinal bleeding 01/04/2012   Hypothyroidism 01/04/2012   Obstruction of bile duct (HCC) 07/19/2011   Abnormal radiographic examination 07/19/2011   IRON DEFICIENCY 09/10/2009    PCP: Massie Rosalva Sewer   REFERRING PROVIDER: Arley SHAUNNA Onetha Mickey, MD   REFERRING DIAG: [id number] (ICD-10-CM) - Unspecified fracture of unspecified lumbar vertebra, subsequent encounter for fracture with nonunion   Rationale for Evaluation and Treatment: Rehabilitation  THERAPY DIAG:  Other low back pain  Other abnormalities of gait and mobility  Muscle weakness (generalized)  Unsteadiness on feet  ONSET DATE: 05/13/2024 laminectomy and PLIF (L2-3)  SUBJECTIVE:                                                                                                                                                                                           SUBJECTIVE STATEMENT: Patient reports being  sore due to increased activity yesterday.    PERTINENT HISTORY:  Patient is now s/p L2-3 laminectomy and PLIF that is cleared from protocols/precautions. This is her 14th spinal surgery. Did not wear back brace until week 4-5. Still having some original leg pain.   Patient has had 13 spinal surgeries, Lt hip replacement with a revision, and Rt knee replacement. Patient has residual deficits in Lt LE sensation leading to diminished light touch which she is now having in the Rt LE. Patient had a fall on June 3rd where she landed on her buttocks which caused slight low back pain, but then was helping a friend out of a lawn chair where the friend was dead weight which is the cause of her significant low back pain. Patient has had a MRI as well as CT which she has a follow up for March 28, 2024. Patient states I received the CT results on MyChart and it said '4mm pedicle screws loosening at bilat L2'. Patient endorses pain in Rt groin and quadriceps with n/t down entire Rt LE.   PAIN:  Are you having pain?  Yes: NPRS scale: 4/10   Pain location: low back and radiates to bilat LEs  Pain description: n/t, dull, achey, throbbing  Aggravating factors: sitting causes most pain, walking for prolonged periods of time  Relieving factors: compression stockings, elevation of LEs, time  PRECAUTIONS: None  RED FLAGS: None   WEIGHT BEARING RESTRICTIONS: No  FALLS:  Has patient fallen in last 6 months? No  LIVING ENVIRONMENT: Lives with: lives with their spouse Lives in: House/apartment Stairs: Yes: External: 2 steps; on right going up Has following equipment at home: Single point cane, Quad cane small base, Walker - 2 wheeled, Environmental Consultant - 4 wheeled, Crutches, and Wheelchair (manual)  OCCUPATION:   PLOF: Independent  PATIENT GOALS: to be awesome  NEXT MD VISIT: around October 01, 2024  OBJECTIVE:  Note: Objective measures were completed at Evaluation unless otherwise noted.  DIAGNOSTIC FINDINGS:  MRI scan lumbar spine overall looks good no disc herniation, no adjacent level disease. There is some edema in the L2 vertebral body.     PATIENT SURVEYS:  PSFS: THE PATIENT SPECIFIC FUNCTIONAL SCALE  Place score of 0-10 (0 = unable to perform activity and 10 = able to perform activity at the same level as before injury or problem)  Activity (updated) Date: 03/25/2024 08/20/2024   Gardening   5 5   2. Walking for prolonged periods   1 5   3. Sitting   5 4   4. Lifting heavier weights  6   Total Score 3.66 5     Total Score = Sum of activity scores/number of activities  Minimally Detectable Change: 3 points (for single activity); 2 points (for average score)  [facility] (nd). The Patient Specific Functional Scale . Retrieved from [email or web address]   COGNITION: Overall cognitive status: Within functional limits for tasks assessed     SENSATION: Light touch: diminished compared to normal   MUSCLE LENGTH: Not tested  on eval   POSTURE: rounded shoulders, forward head, and increased thoracic kyphosis  PALPATION: Not assessed on eval   LUMBAR ROM:   AROM Eval 03/25/2024  Flexion WFL, but painful  Extension 5% and highly painful  Right lateral flexion 5% and highly painful  Left lateral flexion 5% and highly painful  Right rotation Unable   Left rotation Unable    (Blank rows = not tested)  LOWER EXTREMITY ROM:     ROM Right Eval 03/25/2024 Left Eval 03/25/2024  Hip flexion    Hip extension    Hip abduction    Hip adduction    Hip internal rotation    Hip external rotation    Knee flexion    Knee extension    Ankle dorsiflexion    Ankle plantarflexion    Ankle inversion    Ankle eversion     (Blank rows = not tested)  LOWER EXTREMITY MMT:    MMT Right Eval 03/25/2024 Left Eval 03/25/2024 Rt 08/20/2024 Lt  08/20/2024  Hip flexion (seated) 3/5 3/5 3+/5 3+/5  Hip extension 2+/5 Unable to test     Hip abduction (seated) 2+/5 Unable to test  4/5 4/5  Hip adduction      Hip internal rotation      Hip external rotation      Knee flexion (seated) 5/5 5/5 4/5 4/5  Knee extension (seated) 5/5 5/5 5/5 5/5  Ankle dorsiflexion (seated)   4/5 3+/5  Ankle plantarflexion 4/5 3+/5    Ankle inversion      Ankle eversion       (Blank rows = not tested)  LUMBAR SPECIAL TESTS:  Not  assessed on eval   FUNCTIONAL TESTS:  Not assessed on eval   GAIT: Distance walked: not formally assessed  Assistive device utilized: None Level of assistance: supervision  Comments: wide BOS, antalgic gait pattern, bilat short steps  TREATMENT DATE:  08/27/24 TherEx:  Nustep level 4 for 7 minutes Mini squat with counter support 2x10  Standing hip abduction 2x10 each side  Standing hip extension 2x10 each side  Seated SLR 2x10 each side  SKTC stretch 2x25 sec Neuro Re Ed Hip abduction in supine with TB 3x10 G Supine Marching with TA activation 3x10 Bridges 3x10    08/20/2024 TherEx:  Nustep  level 3 for 7 minutes Mini squat with counter support 1x10  Standing hip abduction 1x10 each side  Standing hip extension 1x10 each side  Seated SLR 1x10 each side   Self-Care:  PT discussed POC   03/25/2024 TherEx:  HEP handout provided with patient performing one set of each activity for appropriate form. Verbal and tactile cues required.                                                                                                                                   PATIENT EDUCATION:  Education details: HEP, POC Person educated: Patient Education method: Explanation, Demonstration, Tactile cues, Verbal cues, and Handouts Education comprehension: verbalized understanding, returned demonstration, verbal cues required, and tactile cues required  HOME EXERCISE PROGRAM: Access Code: [email or web address] URL: [email or web address] Date: 08/20/2024 Prepared by: Susannah Daring  Exercises - Mini Squat with Counter Support  - 1 x daily - 7 x weekly - 2 sets - 10 reps - Standing Hip Abduction with Counter Support  - 1 x daily - 7 x weekly - 2 sets - 10 reps - Standing Hip Extension with Counter Support  - 1 x daily - 7 x weekly - 2 sets - 10 reps - Seated Active Straight-Leg Raise  - 1 x daily - 7 x weekly - 2 sets - 10 reps  ASSESSMENT:  CLINICAL IMPRESSION: Reviewed HEP.  Needed VC for form but demonstrated understanding. Able to tolerate increased sets in exercise program without needing increased rest breaks.   OBJECTIVE IMPAIRMENTS: Abnormal gait, decreased activity tolerance, decreased balance, decreased mobility, difficulty walking, decreased ROM, decreased strength, hypomobility, impaired sensation, improper body mechanics, postural dysfunction, and pain.   ACTIVITY LIMITATIONS: lifting, bending, sitting, standing, squatting, sleeping, stairs, and bed mobility  PARTICIPATION LIMITATIONS: driving, shopping, community activity, and yard work  PERSONAL FACTORS:  Past/current experiences, Time since onset of injury/illness/exacerbation, and 3+ comorbidities: anemia, anxiety, HTN, thyroid disease, Hx of cancer are also affecting patient's functional outcome.   REHAB POTENTIAL: Good  CLINICAL DECISION MAKING: Evolving/moderate complexity  EVALUATION COMPLEXITY: Moderate   GOALS: Goals reviewed with patient? Yes  SHORT TERM GOALS: Target date: 09/10/2024  Patient will be compliant with initial HEP.  Baseline: Goal status: INITIAL  2.  Patient will report pain levels no greater than  5/10 to show improvement in overall quality of life. Baseline:  Goal status: INITIAL   LONG TERM GOALS: Target date: 10/15/2024  Patient will be independent with final HEP in order to maintain and progress upon functional gains made within PT. Baseline:  Goal status: INITIAL  2.  Patient will report pain levels no greater than 4/10 in order to show improvement in overall quality of life. Baseline:  Goal status: INITIAL  3.  Patient will increase PSFS to at least 7 to show significant improvement in subjective disability rating. Baseline:  Goal status: INITIAL  4.  Patient will increase hip extension MMT to at least 4/5 in order to show improvement in biomechanics with functional mobility. Baseline:  Goal status: INITIAL  PLAN:  PT FREQUENCY: 1x/week  PT DURATION: 8 weeks  PLANNED INTERVENTIONS: [id number]- PT Re-evaluation, 97750- Physical Performance Testing, 97110-Therapeutic exercises, 97530- Therapeutic activity, [id number]- Neuromuscular re-education, 97535- Self Care, [id number]- Manual therapy, [id number]- Gait training, [phone]- Electrical stimulation (unattended), [phone]- Electrical stimulation (manual), [id number]- Vasopneumatic device, [id number]- Ultrasound, [id number]- Traction (mechanical), [id number]- Ionotophoresis 4mg /ml Dexamethasone , 79439 (1-2 muscles), [id number] (3+ muscles)- Dry Needling, Patient/Family education, Balance training, Stair training, Taping, Joint mobilization, Joint  manipulation, Spinal manipulation, Spinal mobilization, Scar mobilization, DME instructions, Cryotherapy, and Moist heat.  PLAN FOR NEXT SESSION:   lumbar ROM, hip musculature strengthening, core strengthening  Burnard Meth, PT 08/27/24  1:39 PM     "

## 2024-08-27 ENCOUNTER — Ambulatory Visit

## 2024-08-27 DIAGNOSIS — M5459 Other low back pain: Secondary | ICD-10-CM | POA: Diagnosis not present

## 2024-08-27 DIAGNOSIS — M6281 Muscle weakness (generalized): Secondary | ICD-10-CM | POA: Diagnosis not present

## 2024-08-27 DIAGNOSIS — R2681 Unsteadiness on feet: Secondary | ICD-10-CM | POA: Diagnosis not present

## 2024-08-27 DIAGNOSIS — R2689 Other abnormalities of gait and mobility: Secondary | ICD-10-CM

## 2024-08-30 ENCOUNTER — Encounter

## 2024-09-05 ENCOUNTER — Encounter

## 2024-09-09 ENCOUNTER — Encounter

## 2024-09-11 ENCOUNTER — Encounter

## 2024-09-16 ENCOUNTER — Encounter

## 2024-09-18 ENCOUNTER — Encounter: Admitting: Physical Therapy
